# Patient Record
Sex: Female | Born: 1935 | Race: White | Hispanic: No | Smoking: Never smoker
Health system: Southern US, Community
[De-identification: ages and names within clinical notes are randomized; demographics above are authoritative.]

## PROBLEM LIST (undated history)

## (undated) DIAGNOSIS — C50312 Malignant neoplasm of lower-inner quadrant of left female breast: Secondary | ICD-10-CM

## (undated) DIAGNOSIS — M1990 Unspecified osteoarthritis, unspecified site: Secondary | ICD-10-CM

## (undated) DIAGNOSIS — Z171 Estrogen receptor negative status [ER-]: Secondary | ICD-10-CM

## (undated) DIAGNOSIS — C50919 Malignant neoplasm of unspecified site of unspecified female breast: Secondary | ICD-10-CM

## (undated) DIAGNOSIS — H9190 Unspecified hearing loss, unspecified ear: Secondary | ICD-10-CM

## (undated) DIAGNOSIS — I10 Essential (primary) hypertension: Secondary | ICD-10-CM

## (undated) HISTORY — DX: Estrogen receptor negative status (ER-): Z17.1

## (undated) HISTORY — DX: Malignant neoplasm of unspecified site of unspecified female breast: C50.919

## (undated) HISTORY — DX: Malignant neoplasm of lower-inner quadrant of left female breast: C50.312

## (undated) HISTORY — PX: ABDOMINAL HYSTERECTOMY: SHX81

---

## 1996-03-05 HISTORY — PX: BREAST LUMPECTOMY: SHX2

## 2001-04-14 ENCOUNTER — Encounter: Admission: RE | Admit: 2001-04-14 | Discharge: 2001-04-14 | Payer: Self-pay | Admitting: Endocrinology

## 2002-04-27 ENCOUNTER — Encounter: Admission: RE | Admit: 2002-04-27 | Discharge: 2002-04-27 | Payer: Self-pay | Admitting: Endocrinology

## 2003-05-04 ENCOUNTER — Encounter: Admission: RE | Admit: 2003-05-04 | Discharge: 2003-05-04 | Payer: Self-pay | Admitting: Endocrinology

## 2004-05-10 ENCOUNTER — Encounter: Admission: RE | Admit: 2004-05-10 | Discharge: 2004-05-10 | Payer: Self-pay | Admitting: Endocrinology

## 2004-12-13 ENCOUNTER — Ambulatory Visit: Payer: Self-pay

## 2004-12-29 ENCOUNTER — Ambulatory Visit: Payer: Self-pay | Admitting: Unknown Physician Specialty

## 2005-01-01 ENCOUNTER — Ambulatory Visit: Payer: Self-pay | Admitting: Unknown Physician Specialty

## 2005-05-11 ENCOUNTER — Encounter: Admission: RE | Admit: 2005-05-11 | Discharge: 2005-05-11 | Payer: Self-pay | Admitting: Endocrinology

## 2005-05-29 ENCOUNTER — Encounter: Admission: RE | Admit: 2005-05-29 | Discharge: 2005-05-29 | Payer: Self-pay | Admitting: Endocrinology

## 2005-10-15 ENCOUNTER — Encounter: Admission: RE | Admit: 2005-10-15 | Discharge: 2005-10-15 | Payer: Self-pay | Admitting: Endocrinology

## 2006-03-05 HISTORY — PX: JOINT REPLACEMENT: SHX530

## 2006-05-14 ENCOUNTER — Encounter: Admission: RE | Admit: 2006-05-14 | Discharge: 2006-05-14 | Payer: Self-pay | Admitting: Endocrinology

## 2007-05-26 ENCOUNTER — Encounter: Payer: Self-pay | Admitting: Endocrinology

## 2007-05-26 ENCOUNTER — Encounter: Admission: RE | Admit: 2007-05-26 | Discharge: 2007-05-26 | Payer: Self-pay | Admitting: Endocrinology

## 2007-06-04 ENCOUNTER — Encounter: Payer: Self-pay | Admitting: Endocrinology

## 2007-09-15 ENCOUNTER — Ambulatory Visit: Payer: Self-pay | Admitting: Unknown Physician Specialty

## 2007-09-25 ENCOUNTER — Inpatient Hospital Stay: Payer: Self-pay | Admitting: Unknown Physician Specialty

## 2007-09-29 ENCOUNTER — Encounter: Payer: Self-pay | Admitting: Endocrinology

## 2007-10-04 ENCOUNTER — Encounter: Payer: Self-pay | Admitting: Endocrinology

## 2007-10-17 ENCOUNTER — Encounter: Payer: Self-pay | Admitting: Unknown Physician Specialty

## 2007-10-31 ENCOUNTER — Ambulatory Visit: Payer: Self-pay | Admitting: Unknown Physician Specialty

## 2007-11-04 ENCOUNTER — Encounter: Payer: Self-pay | Admitting: Unknown Physician Specialty

## 2007-12-04 ENCOUNTER — Encounter: Payer: Self-pay | Admitting: Unknown Physician Specialty

## 2008-01-04 ENCOUNTER — Encounter: Payer: Self-pay | Admitting: Unknown Physician Specialty

## 2008-05-27 ENCOUNTER — Encounter: Admission: RE | Admit: 2008-05-27 | Discharge: 2008-05-27 | Payer: Self-pay | Admitting: Endocrinology

## 2009-06-08 ENCOUNTER — Encounter: Admission: RE | Admit: 2009-06-08 | Discharge: 2009-06-08 | Payer: Self-pay | Admitting: Endocrinology

## 2010-03-26 ENCOUNTER — Encounter: Payer: Self-pay | Admitting: Endocrinology

## 2010-05-23 ENCOUNTER — Other Ambulatory Visit: Payer: Self-pay | Admitting: Endocrinology

## 2010-05-23 ENCOUNTER — Other Ambulatory Visit: Payer: Self-pay | Admitting: *Deleted

## 2010-05-23 DIAGNOSIS — Z853 Personal history of malignant neoplasm of breast: Secondary | ICD-10-CM

## 2010-05-23 DIAGNOSIS — Z1231 Encounter for screening mammogram for malignant neoplasm of breast: Secondary | ICD-10-CM

## 2010-06-20 ENCOUNTER — Ambulatory Visit
Admission: RE | Admit: 2010-06-20 | Discharge: 2010-06-20 | Disposition: A | Discharge status: Home / Self Care | Payer: Medicare Other | Source: Ambulatory Visit | Attending: Endocrinology | Admitting: Endocrinology

## 2010-06-20 DIAGNOSIS — Z853 Personal history of malignant neoplasm of breast: Secondary | ICD-10-CM

## 2010-06-20 DIAGNOSIS — Z1231 Encounter for screening mammogram for malignant neoplasm of breast: Secondary | ICD-10-CM

## 2010-06-26 ENCOUNTER — Other Ambulatory Visit: Payer: Self-pay | Admitting: Endocrinology

## 2010-06-26 DIAGNOSIS — N63 Unspecified lump in unspecified breast: Secondary | ICD-10-CM

## 2010-07-04 ENCOUNTER — Other Ambulatory Visit: Payer: Self-pay | Admitting: Endocrinology

## 2010-07-04 ENCOUNTER — Ambulatory Visit
Admission: RE | Admit: 2010-07-04 | Discharge: 2010-07-04 | Disposition: A | Discharge status: Home / Self Care | Payer: Medicare Other | Source: Ambulatory Visit | Attending: Endocrinology | Admitting: Endocrinology

## 2010-07-04 DIAGNOSIS — N63 Unspecified lump in unspecified breast: Secondary | ICD-10-CM

## 2011-05-15 ENCOUNTER — Ambulatory Visit: Payer: Self-pay | Admitting: Endocrinology

## 2011-05-28 ENCOUNTER — Other Ambulatory Visit: Payer: Self-pay | Admitting: Endocrinology

## 2011-05-28 DIAGNOSIS — Z1231 Encounter for screening mammogram for malignant neoplasm of breast: Secondary | ICD-10-CM

## 2011-07-19 ENCOUNTER — Ambulatory Visit
Admission: RE | Admit: 2011-07-19 | Discharge: 2011-07-19 | Disposition: A | Discharge status: Home / Self Care | Payer: Medicare Other | Source: Ambulatory Visit | Attending: Endocrinology | Admitting: Endocrinology

## 2011-07-19 DIAGNOSIS — Z1231 Encounter for screening mammogram for malignant neoplasm of breast: Secondary | ICD-10-CM

## 2012-06-10 ENCOUNTER — Other Ambulatory Visit: Payer: Self-pay

## 2012-06-10 DIAGNOSIS — Z853 Personal history of malignant neoplasm of breast: Secondary | ICD-10-CM

## 2012-06-10 DIAGNOSIS — Z1231 Encounter for screening mammogram for malignant neoplasm of breast: Secondary | ICD-10-CM

## 2012-07-21 ENCOUNTER — Ambulatory Visit
Admission: RE | Admit: 2012-07-21 | Discharge: 2012-07-21 | Disposition: A | Discharge status: Home / Self Care | Payer: Medicare Other | Source: Ambulatory Visit

## 2012-07-21 DIAGNOSIS — Z853 Personal history of malignant neoplasm of breast: Secondary | ICD-10-CM

## 2012-07-21 DIAGNOSIS — Z1231 Encounter for screening mammogram for malignant neoplasm of breast: Secondary | ICD-10-CM

## 2013-03-05 HISTORY — PX: MASTECTOMY: SHX3

## 2013-07-16 ENCOUNTER — Other Ambulatory Visit: Payer: Self-pay

## 2013-07-21 ENCOUNTER — Ambulatory Visit: Payer: Self-pay | Admitting: Oncology

## 2013-07-28 ENCOUNTER — Ambulatory Visit: Payer: Self-pay | Admitting: Endocrinology

## 2013-08-03 ENCOUNTER — Ambulatory Visit: Payer: Self-pay | Admitting: Oncology

## 2013-08-14 ENCOUNTER — Ambulatory Visit: Payer: Self-pay | Admitting: Oncology

## 2013-08-19 ENCOUNTER — Ambulatory Visit: Payer: Self-pay | Admitting: Surgery

## 2013-08-28 ENCOUNTER — Ambulatory Visit: Payer: Self-pay | Admitting: Surgery

## 2013-09-01 LAB — PATHOLOGY REPORT

## 2013-09-02 ENCOUNTER — Ambulatory Visit: Payer: Self-pay | Admitting: Oncology

## 2013-09-11 ENCOUNTER — Ambulatory Visit: Payer: Self-pay | Admitting: Oncology

## 2013-10-03 ENCOUNTER — Ambulatory Visit: Payer: Self-pay | Admitting: Oncology

## 2014-06-26 NOTE — Op Note (Signed)
PATIENT NAME:  Hayley [name], Hayley [name] MR#:  [id number] DATE OF BIRTH:  20-Feb-1934  DATE OF PROCEDURE:  08/28/2013  PREOPERATIVE DIAGNOSIS: Carcinoma of the right nipple.   POSTOPERATIVE DIAGNOSIS: Carcinoma of the right nipple.   PROCEDURE: Right mastectomy with sentinel lymph node evaluation.   SURGEON: Dr. Rochel Brome.   ANESTHESIA: General.   INDICATION: This 81 year old female has a past history of carcinoma of the right breast and previously has had 3 excisions dating back some 20 years and has had previous radiation, recently had an abnormal appearance of her nipple and biopsy demonstrated invasive mammary carcinoma. Surgery was recommended for definitive treatment.   DESCRIPTION OF PROCEDURE: The patient was placed on the operating table in the supine position under general anesthesia. The right arm was placed on a lateral arm support. The right breast was prepared with ChloraPrep and draped in a sterile manner.   Initial inspection with a gamma counter revealed radioactivity in the breast, which extended up into the axilla although there were no focal point of radioactivity in the axilla apart from the breast. A curvilinear incision was made from medial to lateral above the old scar, which was in the upper aspect of the right breast and also extended just below the border of the areola. Skin and subcutaneous flaps were raised using silk sutures for traction. Flaps were raised in the direction of the clavicle medially to the sternum, inferiorly to the inframammary fold, and laterally towards the latissimus dorsi muscle. The breast was elevated off the underlying fascia with use of electrocautery. The axillary tail was also removed. This was further probed with a gamma counter and did not identify a separate point of radioactivity apart from the breast tissue itself as there was no island or focal area of radioactivity. There was no grossly palpable mass within the axillary tail. The lateral end of  the skin ellipse was tagged with a silk suture for the pathologist's orientation and there was no remaining radioactivity found in the maxilla and no remaining palpable mass within the axilla after the excision. The wound was inspected and hemostasis was intact. There was one small buttonhole in the skin inferior to the lower skin edge which was repaired with a 4-0 Monocryl suture. This buttonhole was approximately 3 mm in dimension. A 15 [place] Blake drain was inserted through a separate inferior medial stab wound. The catheter was cut to fit so that the tip was placed in the axilla. The catheter was sutured to the skin with 3-0 nylon. Next, the skin edges were approximated with running 4-0 Monocryl subcuticular suture and Dermabond. The patient tolerated the procedure satisfactorily and was then prepared for transfer to the recovery room    ____________________________ J. Rochel Brome, MD jws:lt D: 08/28/2013 14:04:54 ET T: 08/29/2013 03:23:45 ET JOB#: [id number]  cc: Loreli Dollar, MD, <Dictator> Loreli Dollar MD ELECTRONICALLY SIGNED 09/02/2013 19:49

## 2014-08-03 ENCOUNTER — Other Ambulatory Visit: Payer: Self-pay | Admitting: Endocrinology

## 2014-08-03 DIAGNOSIS — Z1231 Encounter for screening mammogram for malignant neoplasm of breast: Secondary | ICD-10-CM

## 2014-08-11 ENCOUNTER — Ambulatory Visit
Admission: RE | Admit: 2014-08-11 | Discharge: 2014-08-11 | Disposition: A | Discharge status: Home / Self Care | Payer: Medicare HMO | Source: Ambulatory Visit | Attending: Endocrinology | Admitting: Endocrinology

## 2014-08-11 ENCOUNTER — Other Ambulatory Visit: Payer: Self-pay | Admitting: Endocrinology

## 2014-08-11 DIAGNOSIS — Z1231 Encounter for screening mammogram for malignant neoplasm of breast: Secondary | ICD-10-CM

## 2014-08-11 HISTORY — DX: Malignant neoplasm of unspecified site of unspecified female breast: C50.919

## 2014-12-16 ENCOUNTER — Ambulatory Visit: Payer: Medicare Other | Admitting: Oncology

## 2014-12-21 ENCOUNTER — Encounter: Payer: Self-pay | Admitting: Oncology

## 2014-12-21 ENCOUNTER — Inpatient Hospital Stay: Payer: Medicare HMO

## 2014-12-21 ENCOUNTER — Inpatient Hospital Stay: Payer: Medicare HMO | Attending: Oncology | Admitting: Oncology

## 2014-12-21 VITALS — BP 167/70 | HR 72 | Temp 95.5°F | Wt 117.7 lb

## 2014-12-21 DIAGNOSIS — R918 Other nonspecific abnormal finding of lung field: Secondary | ICD-10-CM | POA: Insufficient documentation

## 2014-12-21 DIAGNOSIS — Z170 Estrogen receptor positive status [ER+]: Secondary | ICD-10-CM

## 2014-12-21 DIAGNOSIS — Z853 Personal history of malignant neoplasm of breast: Secondary | ICD-10-CM

## 2014-12-21 DIAGNOSIS — I972 Postmastectomy lymphedema syndrome: Secondary | ICD-10-CM | POA: Diagnosis not present

## 2014-12-21 DIAGNOSIS — Z87891 Personal history of nicotine dependence: Secondary | ICD-10-CM | POA: Diagnosis not present

## 2014-12-21 DIAGNOSIS — Z923 Personal history of irradiation: Secondary | ICD-10-CM | POA: Diagnosis not present

## 2014-12-21 DIAGNOSIS — I890 Lymphedema, not elsewhere classified: Secondary | ICD-10-CM

## 2014-12-21 DIAGNOSIS — I10 Essential (primary) hypertension: Secondary | ICD-10-CM | POA: Diagnosis not present

## 2014-12-21 DIAGNOSIS — R590 Localized enlarged lymph nodes: Secondary | ICD-10-CM | POA: Diagnosis not present

## 2014-12-21 DIAGNOSIS — Z9013 Acquired absence of bilateral breasts and nipples: Secondary | ICD-10-CM

## 2014-12-21 DIAGNOSIS — R591 Generalized enlarged lymph nodes: Secondary | ICD-10-CM

## 2014-12-21 LAB — COMPREHENSIVE METABOLIC PANEL
ALK PHOS: 50 U/L (ref 38–126)
ALT: 25 U/L (ref 14–54)
AST: 25 U/L (ref 15–41)
Albumin: 4.4 g/dL (ref 3.5–5.0)
Anion gap: 6 (ref 5–15)
BILIRUBIN TOTAL: 0.9 mg/dL (ref 0.3–1.2)
BUN: 19 mg/dL (ref 6–20)
CALCIUM: 8.6 mg/dL — AB (ref 8.9–10.3)
CO2: 28 mmol/L (ref 22–32)
CREATININE: 0.73 mg/dL (ref 0.44–1.00)
Chloride: 100 mmol/L — ABNORMAL LOW (ref 101–111)
Glucose, Bld: 91 mg/dL (ref 65–99)
Potassium: 4.5 mmol/L (ref 3.5–5.1)
Sodium: 134 mmol/L — ABNORMAL LOW (ref 135–145)
TOTAL PROTEIN: 7.1 g/dL (ref 6.5–8.1)

## 2014-12-21 LAB — CBC WITH DIFFERENTIAL/PLATELET
Basophils Absolute: 0 10*3/uL (ref 0–0.1)
Basophils Relative: 1 %
Eosinophils Absolute: 0.2 10*3/uL (ref 0–0.7)
Eosinophils Relative: 4 %
HEMATOCRIT: 42.3 % (ref 35.0–47.0)
HEMOGLOBIN: 14.3 g/dL (ref 12.0–16.0)
LYMPHS ABS: 0.9 10*3/uL — AB (ref 1.0–3.6)
LYMPHS PCT: 14 %
MCH: 30.5 pg (ref 26.0–34.0)
MCHC: 33.8 g/dL (ref 32.0–36.0)
MCV: 90.2 fL (ref 80.0–100.0)
Monocytes Absolute: 0.8 10*3/uL (ref 0.2–0.9)
Monocytes Relative: 11 %
NEUTROS PCT: 70 %
Neutro Abs: 4.9 10*3/uL (ref 1.4–6.5)
Platelets: 202 10*3/uL (ref 150–440)
RBC: 4.69 MIL/uL (ref 3.80–5.20)
RDW: 13.7 % (ref 11.5–14.5)
WBC: 6.9 10*3/uL (ref 3.6–11.0)

## 2014-12-21 LAB — LACTATE DEHYDROGENASE: LDH: 187 U/L (ref 98–192)

## 2014-12-21 NOTE — Progress Notes (Signed)
Patient states she is here today for right arm edema.  Patient has right mastectomy one year ago.  She noticed the arm swelling after pulling up flowers.

## 2014-12-21 NOTE — Progress Notes (Signed)
[place] @ [facility] Telephone:(336) [phone]  Fax:(336) [place]: 20-Oct-1933  MR#: [id number]  LGX#:[id number]  Patient Care Team: Lenard Simmer, MD as PCP - General (Endocrinology)  CHIEF COMPLAINT:  Chief Complaint  Patient presents with  . OTHER  1.patient had carcinoma of breast (right side) patient was operated at [facility] had radiation therapy.  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 Recent abnormaliity of  the nipple:exact duration not knownbiopsy was positive for invasive carcinoma Estrogen receptor positive.  Progesterone receptor positive.  HER-2 receptor negative status post mastectomy Skin involvement T4 B. N0 M0 tumor (June 2015) Patient refused any radiation chemotherapy or any other anti-hormonal therapy   81 year old lady underwent mastectomy of the right breast.  Patient had in the same breast DCIS lumpectomy and radiation therapy in the past.  Had abbreviated course of tamoxifen. Patient had abnormality of the nipple underwent mastectomy.  Pathology was as described involving skene dermis.  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  At [facility].  In June 2015 patient had abnormal mammogram underwent mastectomy with invasive cancer with involvement of skin and nipple Patient refused any further intervention.  Then was lost for follow-up.  Patient even declined anti-hormonal therapy Recently patient was trying to  PULL flowers and noticed swelling of the right upper extremity. Patient was evaluated by primary care physician with a diagnosis of lymphedema was referred to me for further evaluation and treatment consideration No pains.  According to patient  she has noticed swelling just in last few weeks Does not smoke.  Does not drink.  Has not lost any weight.  REVIEW OF SYSTEMS:   GENERAL:  Feels good.  Active.  No fevers, sweats or weight loss. As mentioned in history of present illness patient had swelling of the right upper extremity PERFORMANCE STATUS (ECOG):  01 HEENT:  No visual changes, runny nose, sore throat, mouth sores or tenderness. Lungs: No shortness of breath or cough.  No hemoptysis. Cardiac:  No chest pain, palpitations, orthopnea, or PND. GI:  No nausea, vomiting, diarrhea, constipation, melena or hematochezia. GU:  No urgency, frequency, dysuria, or hematuria. Musculoskeletal:  No back pain.  No joint pain.  No muscle tenderness. Extremities:  No pain or swelling. Skin:  No rashes or skin changes. Neuro:  No headache, numbness or weakness, balance or coordination issues. Endocrine:  No diabetes, thyroid issues, hot flashes or night sweats. Psych:  No mood changes, depression or anxiety. Pain:  No focal pain. Review of systems:  All other systems reviewed and found to be negative. As per HPI. Otherwise, a complete review of systems is negatve.  PAST MEDICAL HISTORY: Past Medical History  Diagnosis Date  . Breast cancer ([place]) 17+ years ago    Right Breast c radiation -- 2015 Mastectomy    PAST SURGICAL HISTORY: Past Surgical History  Procedure Laterality Date  . Mastectomy Right 2015    After screening in May 2015 - Pt choice  . Abdominal hysterectomy    . Breast lumpectomy Right 1998    c Radiation    Significant History/PMH:   hysterctomy:    arthroscopy-right knee:    chicken pox:    breast cancer:  right total knee replacement: 25-Sep-2007   lumpectomy:   Preventive Screening:  Has patient had any of the following test? Mammography (1)   Last Mammography: May 2014(1)   Smoking History: Smoking History quit 50 years ago(1)  PFSH: Family History: noncontributory  Comments: No   family history of any malignancy  Social History: negative alcohol, negative tobacco  Additional Past Medical and Surgical History: As mentioned above   ADVANCED DIRECTIVES:  Patient does have advance healthcare directive, Patient   does not desire to make any changes HEALTH MAINTENANCE: Social History  Substance Use Topics  . Smoking status: Never Smoker   . Smokeless tobacco: None  . Alcohol Use: None        OBJECTIVE:  Filed Vitals:   12/21/14 0957  BP: 167/70  Pulse: 72  Temp: 95.5 F (35.3 C)     There is no height on file to calculate BMI.    ECOG FS:1 - Symptomatic but completely ambulatory  PHYSICAL EXAM: GENERAL:  Well developed, well nourished, sitting comfortably in the exam room in no acute distress. MENTAL STATUS:  Alert and oriented to person, place and time.  ENT:  Oropharynx clear without lesion.  Tongue normal. Mucous membranes moist.  RESPIRATORY:  Clear to auscultation without rales, wheezes or rhonchi. CARDIOVASCULAR:  Regular rate and rhythm without murmur, rub or gallop. BREAST:  Right chest wall area there is no evidence of recurrent disease. ABDOMEN:  Soft, non-tender, with active bowel sounds, and no hepatosplenomegaly.  No masses. BACK:  No CVA tenderness.  No tenderness on percussion of the back or rib cage. SKIN:  No rashes, ulcers or lesions. EXTREMITIES: Right upper extremity lymphedema LYMPH NODES there are small palpable lymph node in the right supraclavicular area and the right side of the neck  NEUROLOGICAL: Unremarkable. PSYCH:  Appropriate.   LAB RESULTS:  CBC Latest Ref Rng 12/21/2014  WBC 3.6 - 11.0 K/uL 6.9  Hemoglobin 12.0 - 16.0 g/dL 14.3  Hematocrit 35.0 - 47.0 % 42.3  Platelets 150 - 440 K/uL 202    Appointment on 12/21/2014  Component Date Value Ref Range Status  . WBC 12/21/2014 6.9  3.6 - 11.0 K/uL Final  . RBC 12/21/2014 4.69  3.80 - 5.20 MIL/uL Final  . Hemoglobin 12/21/2014 14.3  12.0 - 16.0 g/dL Final  . HCT  12/21/2014 42.3  35.0 - 47.0 % Final  . MCV 12/21/2014 90.2  80.0 - 100.0 fL Final  . MCH 12/21/2014 30.5  26.0 - 34.0 pg Final  . MCHC 12/21/2014 33.8  32.0 - 36.0 g/dL Final  . RDW 12/21/2014 13.7  11.5 - 14.5 % Final  . Platelets 12/21/2014 202  150 - 440 K/uL Final  . Neutrophils Relative % 12/21/2014 70   Final  . Neutro Abs 12/21/2014 4.9  1.4 - 6.5 K/uL Final  . Lymphocytes Relative 12/21/2014 14   Final  . Lymphs Abs 12/21/2014 0.9* 1.0 - 3.6 K/uL Final  . Monocytes Relative 12/21/2014 11   Final  . Monocytes Absolute 12/21/2014 0.8  0.2 - 0.9 K/uL Final  . Eosinophils Relative 12/21/2014 4   Final  . Eosinophils Absolute 12/21/2014 0.2  0 - 0.7 K/uL Final  . Basophils Relative 12/21/2014 1   Final  . Basophils Absolute 12/21/2014 0.0  0 - 0.1 K/uL Final  . Sodium 12/21/2014 134* 135 - 145 mmol/L Final  . Potassium 12/21/2014 4.5  3.5 - 5.1 mmol/L Final  . Chloride 12/21/2014 100* 101 - 111 mmol/L Final  .  CO2 12/21/2014 28  22 - 32 mmol/L Final  . Glucose, Bld 12/21/2014 91  65 - 99 mg/dL Final  . BUN 12/21/2014 19  6 - 20 mg/dL Final  . Creatinine, Ser 12/21/2014 0.73  0.44 - 1.00 mg/dL Final  . Calcium 12/21/2014 8.6* 8.9 - 10.3 mg/dL Final  . Total Protein 12/21/2014 7.1  6.5 - 8.1 g/dL Final  . Albumin 12/21/2014 4.4  3.5 - 5.0 g/dL Final  . AST 12/21/2014 25  15 - 41 U/L Final  . ALT 12/21/2014 25  14 - 54 U/L Final  . Alkaline Phosphatase 12/21/2014 50  38 - 126 U/L Final  . Total Bilirubin 12/21/2014 0.9  0.3 - 1.2 mg/dL Final  . GFR calc non Af Amer 12/21/2014 >60  >60 mL/min Final  . GFR calc Af Amer 12/21/2014 >60  >60 mL/min Final   Comment: (NOTE) The eGFR has been calculated using the CKD EPI equation. This calculation has not been validated in all clinical situations. eGFR's persistently <60 mL/min signify possible Chronic Kidney Disease.   . Anion gap 12/21/2014 6  5 - 15 Final  . LDH 12/21/2014 187  98 - 192 U/L Final        ASSESSMENT: 1.   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  Progesterone receptor positive.  HER-2 receptor negative Patient declined any further radiation chemotherapy or anti-hormonal therapy 2, patient now presents with lymphedema on the right upper extremity with small palpable lymph node in the right supraclavicular area and in the neck   MEDICAL DECISION MAKING:  I reviewed all the records from primary care physician Will proceed with CT scan of the neck and chest If no other abnormality detected and a small palpable right supraclavicular lymph node can be biopsied I suspect recurrence of the breast cancer however other etiology of lymphedema cannot be ruled out like previous mastectomy radiation therapy I told patient that if there is no other abnormalities found on CT scan then lymphedema therapy can be recommended All lab data has been reviewed and they are within acceptable range Patient will be reevaluated after CT scan Total duration of visit was 45 minutes.  50% or more time was spent in counseling patient and family regarding prognosis and options of treatment and available resources  Patient expressed understanding and was in agreement with this plan. She also understands that She can call clinic at any time with any questions, concerns, or complaints.    No matching staging information was found for the patient.  Forest Gleason, MD   12/21/2014 11:55 AM

## 2014-12-27 ENCOUNTER — Ambulatory Visit
Admission: RE | Admit: 2014-12-27 | Discharge: 2014-12-27 | Disposition: A | Discharge status: Home / Self Care | Payer: Medicare HMO | Source: Ambulatory Visit | Attending: Oncology | Admitting: Oncology

## 2014-12-27 DIAGNOSIS — R599 Enlarged lymph nodes, unspecified: Secondary | ICD-10-CM | POA: Insufficient documentation

## 2014-12-27 DIAGNOSIS — I890 Lymphedema, not elsewhere classified: Secondary | ICD-10-CM | POA: Insufficient documentation

## 2014-12-27 DIAGNOSIS — Z853 Personal history of malignant neoplasm of breast: Secondary | ICD-10-CM | POA: Insufficient documentation

## 2014-12-27 DIAGNOSIS — R591 Generalized enlarged lymph nodes: Secondary | ICD-10-CM

## 2014-12-27 HISTORY — DX: Essential (primary) hypertension: I10

## 2014-12-27 MED ORDER — IOHEXOL 300 MG/ML  SOLN
75.0000 mL | Freq: Once | INTRAMUSCULAR | Status: AC | PRN
Start: 2014-12-27 — End: 2014-12-27
  Administered 2014-12-27: 75 mL via INTRAVENOUS

## 2014-12-29 ENCOUNTER — Inpatient Hospital Stay: Payer: Medicare HMO

## 2014-12-29 ENCOUNTER — Inpatient Hospital Stay (HOSPITAL_BASED_OUTPATIENT_CLINIC_OR_DEPARTMENT_OTHER): Payer: Medicare HMO | Admitting: Oncology

## 2014-12-29 VITALS — BP 158/77 | HR 71 | Temp 97.7°F | Wt 117.7 lb

## 2014-12-29 DIAGNOSIS — Z9013 Acquired absence of bilateral breasts and nipples: Secondary | ICD-10-CM | POA: Diagnosis not present

## 2014-12-29 DIAGNOSIS — Z853 Personal history of malignant neoplasm of breast: Secondary | ICD-10-CM

## 2014-12-29 DIAGNOSIS — C50919 Malignant neoplasm of unspecified site of unspecified female breast: Secondary | ICD-10-CM

## 2014-12-29 DIAGNOSIS — I10 Essential (primary) hypertension: Secondary | ICD-10-CM

## 2014-12-29 DIAGNOSIS — Z170 Estrogen receptor positive status [ER+]: Secondary | ICD-10-CM

## 2014-12-29 DIAGNOSIS — Z923 Personal history of irradiation: Secondary | ICD-10-CM

## 2014-12-29 DIAGNOSIS — R918 Other nonspecific abnormal finding of lung field: Secondary | ICD-10-CM

## 2014-12-29 DIAGNOSIS — I972 Postmastectomy lymphedema syndrome: Secondary | ICD-10-CM

## 2014-12-29 DIAGNOSIS — Z87891 Personal history of nicotine dependence: Secondary | ICD-10-CM

## 2014-12-29 DIAGNOSIS — R590 Localized enlarged lymph nodes: Secondary | ICD-10-CM

## 2014-12-29 LAB — PROTIME-INR
INR: 0.99
PROTHROMBIN TIME: 13.3 s (ref 11.4–15.0)

## 2014-12-29 LAB — APTT: APTT: 27 s (ref 24–36)

## 2014-12-30 LAB — CANCER ANTIGEN 27.29: CA 27.29: 46.3 U/mL — AB (ref 0.0–38.6)

## 2015-01-01 ENCOUNTER — Encounter: Payer: Self-pay | Admitting: Oncology

## 2015-01-01 NOTE — Progress Notes (Signed)
[place] @ [facility] Telephone:(336) [phone]  Fax:(336) [place]: Jul 06, 1933  MR#: [id number]  EPP#:[id number]  Patient Care Team: Lenard Simmer, MD as PCP - General (Endocrinology)  CHIEF COMPLAINT:  Chief Complaint  Patient presents with  . OTHER  1.patient had carcinoma of breast (right side) patient was operated at [facility] had radiation therapy.  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 Recent abnormaliity of  the nipple:exact duration not knownbiopsy was positive for invasive carcinoma Estrogen receptor positive.  Progesterone receptor positive.  HER-2 receptor negative status post mastectomy Skin involvement T4 B. N0 M0 tumor (June 2015) Patient refused any radiation chemotherapy or any other anti-hormonal therapy   81 year old lady underwent mastectomy of the right breast.  Patient had in the same breast DCIS lumpectomy and radiation therapy in the past.  Had abbreviated course of tamoxifen. Patient had abnormality of the nipple underwent mastectomy.  Pathology was as described involving skene dermis.  Estrogen receptor progesterone receptor  positive and HER-2/neu receptor negative Patient is here to discuss the results and further planning of treatment  INTERVAL HISTORY:  Patient is here with a history of progressive lymphedema in right upper extremity.  As there was a palpable lymph node in the l supraclavicular area.  A CT scan of the neck and chest was done patient is here to discuss the results and further planning of treatment REVIEW OF SYSTEMS:   GENERAL:  Feels good.  Active.  No fevers, sweats or weight loss. As mentioned in history of present illness patient had swelling of the right upper extremity PERFORMANCE STATUS (ECOG):  01 HEENT:  No visual changes, runny nose, sore throat, mouth sores or tenderness. Lungs: No shortness of breath or cough.  No hemoptysis. Cardiac:  No chest pain, palpitations, orthopnea, or  PND. GI:  No nausea, vomiting, diarrhea, constipation, melena or hematochezia. GU:  No urgency, frequency, dysuria, or hematuria. Musculoskeletal:  No back pain.  No joint pain.  No muscle tenderness. Extremities:  No pain or swelling. Skin:  No rashes or skin changes. Neuro:  No headache, numbness or weakness, balance or coordination issues. Endocrine:  No diabetes, thyroid issues, hot flashes or night sweats. Psych:  No mood changes, depression or anxiety. Pain:  No focal pain. Review of systems:  All other systems reviewed and found to be negative. As per HPI. Otherwise, a complete review of systems is negatve.  PAST MEDICAL HISTORY: Past Medical History  Diagnosis Date  . Breast cancer ([place]) 17+ years ago    Right Breast c radiation -- 2015 Mastectomy  . Hypertension     PAST SURGICAL HISTORY: Past Surgical History  Procedure Laterality Date  . Mastectomy Right 2015    After screening in May 2015 - Pt choice  . Abdominal hysterectomy    . Breast lumpectomy Right 1998    c Radiation    Significant History/PMH:   hysterctomy:    arthroscopy-right knee:    chicken pox:    breast cancer:    right total knee replacement: 25-Sep-2007   lumpectomy:   Preventive Screening:  Has patient had any of the following test? Mammography (1)   Last Mammography: May 2014(1)   Smoking History: Smoking History quit 50 years ago(1)  PFSH: Family History: noncontributory  Comments: No  family history of any malignancy  Social History: negative alcohol, negative tobacco  Additional Past Medical and Surgical History: As mentioned above   ADVANCED DIRECTIVES:  Patient does have advance healthcare directive, Patient   does not desire to make any changes HEALTH MAINTENANCE: Social History  Substance Use Topics  . Smoking status: Never Smoker   . Smokeless tobacco: None  . Alcohol Use: None        OBJECTIVE:  Filed Vitals:   12/29/14 0839  BP: 158/77  Pulse: 71    Temp: 97.7 F (36.5 C)     There is no height on file to calculate BMI.    ECOG FS:1 - Symptomatic but completely ambulatory  PHYSICAL EXAM: GENERAL:  Well developed, well nourished, sitting comfortably in the exam room in no acute distress. MENTAL STATUS:  Alert and oriented to person, place and time.  ENT:  Oropharynx clear without lesion.  Tongue normal. Mucous membranes moist.  RESPIRATORY:  Clear to auscultation without rales, wheezes or rhonchi. CARDIOVASCULAR:  Regular rate and rhythm without murmur, rub or gallop. BREAST:  Right chest wall area there is no evidence of recurrent disease. ABDOMEN:  Soft, non-tender, with active bowel sounds, and no hepatosplenomegaly.  No masses. BACK:  No CVA tenderness.  No tenderness on percussion of the back or rib cage. SKIN:  No rashes, ulcers or lesions. EXTREMITIES: Right upper extremity lymphedema LYMPH NODES there are small palpable lymph node in the right supraclavicular area and the right side of the neck  NEUROLOGICAL: Unremarkable. PSYCH:  Appropriate.   LAB RESULTS:  CBC Latest Ref Rng 12/21/2014  WBC 3.6 - 11.0 K/uL 6.9  Hemoglobin 12.0 - 16.0 g/dL 14.3  Hematocrit 35.0 - 47.0 % 42.3  Platelets 150 - 440 K/uL 202    Appointment on 12/29/2014  Component Date Value Ref Range Status  . CA 27.29 12/29/2014 46.3* 0.0 - 38.6 U/mL Final   Comment: (NOTE) [facility]/ACS methodology Performed At: [facility] [place], [place] [id number] Lindon Romp MD KF:[id number]   . aPTT 12/29/2014 27  24 - 36 seconds Final  . Prothrombin Time 12/29/2014 13.3  11.4 - 15.0 seconds Final  . INR 12/29/2014 0.99   Final    IMPRESSION: 1. Right supraclavicular adenopathy consistent with metastatic disease. 2. Intrathoracic staging reported separately. 1. Neck findings dictated separately. 2. Right axillary/subpectoral space adenopathy worrisome for local recurrence of breast cancer. This could affect the  brachial plexus on the right. 3. Bilateral lower lobe pulmonary nodules worrisome for metastatic disease. 4. Correlation with outside imaging and restaging PET-CT may be helpful.   ASSESSMENT: 1.  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  Progesterone receptor positive.  HER-2 receptor negative Patient declined any further radiation chemotherapy or anti-hormonal therapy 2, patient now presents with lymphedema on the right upper extremity with small palpable lymph node in the right supraclavicular area and in the neck   MEDICAL DECISION MAKING:   All CT scan of the neck and the chest is been reviewed independently and reviewed with the patient. I had prolonged discussion with patient that most likely we are dealing with recurrence of the breast cancer I would like to get a PET scan done followed by a possible needle biopsy of one of the lymph node to confirm the diagnosis.  If we do not see any extensive disease in the liver or lung possibility of anti-hormonal therapy with  letrozole and IBRANCE can be considered if we confirm this to be of breast cancer or recurrent disease  At this point in time patient is agreeable to start that treatment  So PET scan has been ordered.  At patient's request all the records would be copied and sent to her primary care physician Patient expressed understanding and was in agreement with this plan. She also understands that She can call clinic at any time with any questions, concerns, or complaints.    No matching staging information was found for the patient.  Forest Gleason, MD   01/01/2015 12:11 PM

## 2015-01-04 ENCOUNTER — Ambulatory Visit
Admission: RE | Admit: 2015-01-04 | Discharge: 2015-01-04 | Disposition: A | Discharge status: Home / Self Care | Payer: Medicare HMO | Source: Ambulatory Visit | Attending: Oncology | Admitting: Oncology

## 2015-01-04 DIAGNOSIS — Z9011 Acquired absence of right breast and nipple: Secondary | ICD-10-CM | POA: Diagnosis not present

## 2015-01-04 DIAGNOSIS — R918 Other nonspecific abnormal finding of lung field: Secondary | ICD-10-CM | POA: Insufficient documentation

## 2015-01-04 DIAGNOSIS — C50919 Malignant neoplasm of unspecified site of unspecified female breast: Secondary | ICD-10-CM

## 2015-01-04 DIAGNOSIS — C50911 Malignant neoplasm of unspecified site of right female breast: Secondary | ICD-10-CM | POA: Insufficient documentation

## 2015-01-04 DIAGNOSIS — R590 Localized enlarged lymph nodes: Secondary | ICD-10-CM | POA: Diagnosis not present

## 2015-01-04 LAB — GLUCOSE, CAPILLARY: GLUCOSE-CAPILLARY: 76 mg/dL (ref 65–99)

## 2015-01-04 MED ORDER — FLUDEOXYGLUCOSE F - 18 (FDG) INJECTION
12.2500 | Freq: Once | INTRAVENOUS | Status: DC | PRN
  Administered 2015-01-04: 12.25 via INTRAVENOUS
  Filled 2015-01-04: qty 12.25

## 2015-01-06 ENCOUNTER — Inpatient Hospital Stay: Payer: Medicare HMO | Attending: Oncology | Admitting: Oncology

## 2015-01-06 ENCOUNTER — Encounter: Payer: Self-pay | Admitting: Oncology

## 2015-01-06 VITALS — BP 171/74 | HR 76 | Temp 95.8°F | Wt 116.8 lb

## 2015-01-06 DIAGNOSIS — M25511 Pain in right shoulder: Secondary | ICD-10-CM | POA: Diagnosis not present

## 2015-01-06 DIAGNOSIS — Z923 Personal history of irradiation: Secondary | ICD-10-CM | POA: Insufficient documentation

## 2015-01-06 DIAGNOSIS — Z86000 Personal history of in-situ neoplasm of breast: Secondary | ICD-10-CM

## 2015-01-06 DIAGNOSIS — I972 Postmastectomy lymphedema syndrome: Secondary | ICD-10-CM | POA: Insufficient documentation

## 2015-01-06 DIAGNOSIS — Z9011 Acquired absence of right breast and nipple: Secondary | ICD-10-CM | POA: Diagnosis not present

## 2015-01-06 DIAGNOSIS — Z170 Estrogen receptor positive status [ER+]: Secondary | ICD-10-CM | POA: Diagnosis not present

## 2015-01-06 DIAGNOSIS — Z87891 Personal history of nicotine dependence: Secondary | ICD-10-CM | POA: Diagnosis not present

## 2015-01-06 DIAGNOSIS — I10 Essential (primary) hypertension: Secondary | ICD-10-CM

## 2015-01-06 DIAGNOSIS — C773 Secondary and unspecified malignant neoplasm of axilla and upper limb lymph nodes: Secondary | ICD-10-CM | POA: Insufficient documentation

## 2015-01-06 DIAGNOSIS — R590 Localized enlarged lymph nodes: Secondary | ICD-10-CM | POA: Insufficient documentation

## 2015-01-06 DIAGNOSIS — Z9013 Acquired absence of bilateral breasts and nipples: Secondary | ICD-10-CM

## 2015-01-06 DIAGNOSIS — Z853 Personal history of malignant neoplasm of breast: Secondary | ICD-10-CM

## 2015-01-06 DIAGNOSIS — Z79811 Long term (current) use of aromatase inhibitors: Secondary | ICD-10-CM | POA: Insufficient documentation

## 2015-01-06 NOTE — Progress Notes (Signed)
[place] @ [facility] Telephone:(336) [phone]  Fax:(336) [place]: Jul 09, 1933  MR#: [id number]  XLK#:[id number]  Patient Care Team: Lenard Simmer, MD as PCP - General (Endocrinology)  CHIEF COMPLAINT:  Chief Complaint  Patient presents with  . Results  1.patient had carcinoma of breast (right side) patient was operated at [facility] had radiation therapy.  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 Recent abnormaliity of  the nipple:exact duration not knownbiopsy was positive for invasive carcinoma Estrogen receptor positive.  Progesterone receptor positive.  HER-2 receptor negative status post mastectomy Skin involvement T4 B. N0 M0 tumor (June 2015) Patient refused any radiation chemotherapy or any other anti-hormonal therapy   81 year old lady underwent mastectomy of the right breast.  Patient had in the same breast DCIS lumpectomy and radiation therapy in the past.  Had abbreviated course of tamoxifen. Patient had abnormality of the nipple underwent mastectomy.  Pathology was as described involving skene dermis.  Estrogen receptor progesterone receptor  positive and HER-2/neu receptor negative Patient is here to discuss the results and further planning of treatment  INTERVAL HISTORY:  Patient is here with a history of progressive lymphedema in right upper extremity.  As there was a palpable lymph node in the l supraclavicular area.  A CT scan of the neck and chest was done patient is here to discuss the results and further planning of treatment.  Patient is here after PET scan was done.  Patient continues to lymphedema.  Has some pain in the right shoulder particularly during the night  REVIEW OF SYSTEMS:   GENERAL:  Feels good.  Active.  No fevers, sweats or weight loss. As mentioned in history of present illness patient had swelling of the right upper extremity PERFORMANCE STATUS (ECOG):  01 HEENT:  No visual changes, runny nose, sore  throat, mouth sores or tenderness. Lungs: No shortness of breath or cough.  No hemoptysis. Cardiac:  No chest pain, palpitations, orthopnea, or PND. GI:  No nausea, vomiting, diarrhea, constipation, melena or hematochezia. GU:  No urgency, frequency, dysuria, or hematuria. Musculoskeletal:  No back pain.  No joint pain.  No muscle tenderness. Extremities:  No pain or swelling. Skin:  No rashes or skin changes. Neuro:  No headache, numbness or weakness, balance or coordination issues. Endocrine:  No diabetes, thyroid issues, hot flashes or night sweats. Psych:  No mood changes, depression or anxiety. Pain:  No focal pain. Review of systems:  All other systems reviewed and found to be negative. As per HPI. Otherwise, a complete review of systems is negatve.  PAST MEDICAL HISTORY: Past Medical History  Diagnosis Date  . Breast cancer ([place]) 17+ years ago    Right Breast c radiation -- 2015 Mastectomy  . Hypertension     PAST SURGICAL HISTORY: Past Surgical History  Procedure Laterality Date  . Mastectomy Right 2015    After screening in May 2015 - Pt choice  . Abdominal hysterectomy    . Breast lumpectomy Right 1998    c Radiation    Significant History/PMH:   hysterctomy:    arthroscopy-right knee:    chicken pox:    breast cancer:    right total knee replacement: 25-Sep-2007   lumpectomy:   Preventive Screening:  Has patient had any of the following test? Mammography (1)   Last Mammography: May 2014(1)   Smoking History: Smoking History quit 50 years ago(1)  PFSH: Family History: noncontributory  Comments: No  family history of any malignancy  Social History: negative alcohol, negative tobacco  Additional Past Medical and Surgical History: As mentioned above   ADVANCED DIRECTIVES:  Patient does have advance healthcare directive, Patient   does not desire to make any changes HEALTH MAINTENANCE: Social History  Substance Use Topics  . Smoking status:  Never Smoker   . Smokeless tobacco: Not on file  . Alcohol Use: Not on file        OBJECTIVE:  Filed Vitals:   01/06/15 0821  BP: 171/74  Pulse: 76  Temp: 95.8 F (35.4 C)     Body mass index is 19.44 kg/(m^2).    ECOG FS:1 - Symptomatic but completely ambulatory  PHYSICAL EXAM: GENERAL:  Well developed, well nourished, sitting comfortably in the exam room in no acute distress. MENTAL STATUS:  Alert and oriented to person, place and time.  ENT:  Oropharynx clear without lesion.  Tongue normal. Mucous membranes moist.  RESPIRATORY:  Clear to auscultation without rales, wheezes or rhonchi. CARDIOVASCULAR:  Regular rate and rhythm without murmur, rub or gallop. BREAST:  Right chest wall area there is no evidence of recurrent disease. ABDOMEN:  Soft, non-tender, with active bowel sounds, and no hepatosplenomegaly.  No masses. BACK:  No CVA tenderness.  No tenderness on percussion of the back or rib cage. SKIN:  No rashes, ulcers or lesions. EXTREMITIES: Right upper extremity lymphedema LYMPH NODES there are small palpable lymph node in the right supraclavicular area and the right side of the neck  NEUROLOGICAL: Unremarkable. PSYCH:  Appropriate.   LAB RESULTS:  CBC Latest Ref Rng 12/21/2014  WBC 3.6 - 11.0 K/uL 6.9  Hemoglobin 12.0 - 16.0 g/dL 14.3  Hematocrit 35.0 - 47.0 % 42.3  Platelets 150 - 440 K/uL [facility] Outpatient Visit on 01/04/2015  Component Date Value Ref Range Status  . Glucose-Capillary 01/04/2015 76  65 - 99 mg/dL Final    IIMPRESSION: Hypermetabolic right supraclavicular and right axillary lymphadenopathy, consistent with metastatic disease.  6 mm hypermetabolic mediastinal lymph node in the lateral aortic region, also suspicious for metastatic disease.  Bilateral posterior lower lobe pulmonary nodules with hypermetabolic activity, suspicious for pulmonary metastases.  No evidence of abdominal or pelvic metastatic  disease.   ASSESSMENT: 1.  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  Progesterone receptor positive.  HER-2 receptor negative Patient declined any further radiation chemotherapy or anti-hormonal therapy 2, patient now presents with lymphedema on the right upper extremity with small palpable lymph node in the right supraclavicular area and in the neck   MEDICAL DECISION MAKING:   A scan has been reviewed shows increase uptake in the right supraclavicular area and right axillary area Both lymph nodes are palpable.  I discussed possibility of CT-guided or ultrasound-guided biopsy.  Patient had number of questions.  Patient may need lymphedema therapy  PET scan has been reviewed independently and reviewed with the patient Plan needle biopsy under CT guided or ultrasound-guided , Possibility of letrozole and IBRANCE versus Faslodex and IBRANCE been discussed in detail Radiation therapy may have rolled however might take lymphedema worse Patient would be referred to lymphedema therapy. Interventional radiologist has been contacted. Patient would be evaluated after biopsies done t patient's request all the records would be copied  and sent to her primary care physician Patient expressed understanding and was in agreement with this plan. She also understands that She can call clinic at any time with any questions, concerns, or complaints.    No matching staging information was found for the patient.  Forest Gleason, MD   01/06/2015 8:28 AM

## 2015-01-06 NOTE — Progress Notes (Signed)
Patient here for PET results.  Right arm continues to be swollen.  Patient offers no other complaints.

## 2015-01-13 ENCOUNTER — Ambulatory Visit
Admission: RE | Admit: 2015-01-13 | Discharge: 2015-01-13 | Disposition: A | Discharge status: Home / Self Care | Payer: Medicare HMO | Source: Ambulatory Visit | Attending: Oncology | Admitting: Oncology

## 2015-01-14 ENCOUNTER — Other Ambulatory Visit: Payer: Self-pay | Admitting: Radiology

## 2015-01-17 ENCOUNTER — Ambulatory Visit
Admission: RE | Admit: 2015-01-17 | Discharge: 2015-01-17 | Disposition: A | Discharge status: Home / Self Care | Payer: Medicare HMO | Source: Ambulatory Visit | Attending: Oncology | Admitting: Oncology

## 2015-01-17 DIAGNOSIS — C8584 Other specified types of non-Hodgkin lymphoma, lymph nodes of axilla and upper limb: Secondary | ICD-10-CM | POA: Insufficient documentation

## 2015-01-17 DIAGNOSIS — R590 Localized enlarged lymph nodes: Secondary | ICD-10-CM | POA: Diagnosis present

## 2015-01-17 DIAGNOSIS — Z853 Personal history of malignant neoplasm of breast: Secondary | ICD-10-CM

## 2015-01-17 MED ORDER — SODIUM CHLORIDE 0.9 % IV SOLN: Freq: Once | INTRAVENOUS | Status: DC

## 2015-01-17 NOTE — OR Nursing (Signed)
Dr Golden Circle reviewed labs, no new labs needed. NS lock only. Pt requesting no moderate sedation so she can drive herself home. Dr Golden Circle concurs however normal saline lock and  meds will be available in event of discomfort.

## 2015-01-17 NOTE — Discharge Instructions (Signed)
Needle Biopsy, Care After °Refer to this sheet in the next few weeks. These instructions provide you with information about caring for yourself after your procedure. Your health care provider may also give you more specific instructions. Your treatment has been planned according to current medical practices, but problems sometimes occur. Call your health care provider if you have any problems or questions after your procedure. °WHAT TO EXPECT AFTER THE PROCEDURE °After your procedure, it is common to have soreness, bruising, or mild pain at the biopsy site. This should go away in a few days. °HOME CARE INSTRUCTIONS °· Rest as directed by your health care provider. °· Take medicines only as directed by your health care provider. °· There are many different ways to close and cover the biopsy site, including stitches (sutures), skin glue, and adhesive strips. Follow your health care provider's instructions about: °¨ Biopsy site care. °¨ Bandage (dressing) changes and removal. °¨ Biopsy site closure removal. °· Check your biopsy site every day for signs of infection. Watch for: °¨ Redness, swelling, or pain. °¨ Fluid, blood, or pus. °SEEK MEDICAL CARE IF: °· You have a fever. °· You have redness, swelling, or pain at the biopsy site that lasts longer than a few days. °· You have fluid, blood, or pus coming from the biopsy site. °· You feel nauseous. °· You vomit. °SEEK IMMEDIATE MEDICAL CARE IF: °· You have shortness of breath. °· You have trouble breathing. °· You have chest pain.   °· You feel dizzy or you faint. °· You have bleeding that does not stop with pressure or a bandage. °· You cough up blood. °· You have pain in your abdomen. °  °This information is not intended to replace advice given to you by your health care provider. Make sure you discuss any questions you have with your health care provider. °  °Document Released: 07/06/2014 Document Reviewed: 07/06/2014 °Elsevier Interactive Patient Education ©2016  [employer]vier Inc[employer] ° °

## 2015-01-17 NOTE — Procedures (Signed)
Right axillary biopsy   Complications:  None  Blood Loss: none  See dictation in canopy pacs

## 2015-01-18 ENCOUNTER — Ambulatory Visit: Payer: Medicare Other | Admitting: Oncology

## 2015-01-20 ENCOUNTER — Encounter: Payer: Self-pay | Admitting: Oncology

## 2015-01-20 ENCOUNTER — Inpatient Hospital Stay (HOSPITAL_BASED_OUTPATIENT_CLINIC_OR_DEPARTMENT_OTHER): Payer: Medicare HMO | Admitting: Oncology

## 2015-01-20 VITALS — BP 139/73 | HR 82 | Temp 96.9°F | Wt 114.4 lb

## 2015-01-20 DIAGNOSIS — C773 Secondary and unspecified malignant neoplasm of axilla and upper limb lymph nodes: Secondary | ICD-10-CM

## 2015-01-20 DIAGNOSIS — C50919 Malignant neoplasm of unspecified site of unspecified female breast: Secondary | ICD-10-CM

## 2015-01-20 DIAGNOSIS — Z79811 Long term (current) use of aromatase inhibitors: Secondary | ICD-10-CM

## 2015-01-20 DIAGNOSIS — Z170 Estrogen receptor positive status [ER+]: Secondary | ICD-10-CM | POA: Diagnosis not present

## 2015-01-20 DIAGNOSIS — Z86000 Personal history of in-situ neoplasm of breast: Secondary | ICD-10-CM

## 2015-01-20 DIAGNOSIS — Z9011 Acquired absence of right breast and nipple: Secondary | ICD-10-CM

## 2015-01-20 DIAGNOSIS — I10 Essential (primary) hypertension: Secondary | ICD-10-CM

## 2015-01-20 DIAGNOSIS — I972 Postmastectomy lymphedema syndrome: Secondary | ICD-10-CM

## 2015-01-20 DIAGNOSIS — Z923 Personal history of irradiation: Secondary | ICD-10-CM

## 2015-01-20 DIAGNOSIS — Z79891 Long term (current) use of opiate analgesic: Secondary | ICD-10-CM

## 2015-01-20 DIAGNOSIS — C50011 Malignant neoplasm of nipple and areola, right female breast: Secondary | ICD-10-CM

## 2015-01-20 HISTORY — DX: Malignant neoplasm of unspecified site of unspecified female breast: C50.919

## 2015-01-20 MED ORDER — PALBOCICLIB 125 MG PO CAPS
125.0000 mg | ORAL_CAPSULE | Freq: Every day | ORAL | Status: DC
Start: 2015-01-20 — End: 2015-04-14

## 2015-01-20 MED ORDER — LETROZOLE 2.5 MG PO TABS: 2.5000 mg | ORAL_TABLET | Freq: Every day | ORAL | Status: DC

## 2015-01-20 NOTE — Progress Notes (Signed)
[place] @ [facility] Telephone:(336) [phone]  Fax:(336) [place]: December 15, 1933  MR#: [id number]  MPN#:[id number]  Patient Care Team: Lenard Simmer, MD as PCP - General (Endocrinology)  CHIEF COMPLAINT:  No chief complaint on file. 1.patient had carcinoma of breast (right side) patient was operated at [facility] had radiation therapy.  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 Recent abnormaliity of  the nipple:exact duration not knownbiopsy was positive for invasive carcinoma Estrogen receptor positive.  Progesterone receptor positive.  HER-2 receptor negative status post mastectomy Skin involvement T4 B. N0 M0 tumor (June 2015) Patient refused any radiation chemotherapy or any other anti-hormonal therapy.  3.  Biopsy of right axillary lymph node is consistent with mammary carcinoma (November, 2016) Estrogen and progesterone receptor pending 4.  Patient has been started on [facility] and letrozole from November of 2016    INTERVAL HISTORY:  81 year old lady came back after biopsy of the right axillary lymph node which is consistent with mammary carcinoma.   Estrogen and progesterone receptor pending but considering the previous estrogen and progesterone receptors were positive will proceed with anti-hormonal therapy  Patient continues to a problem with lymphedema t  REVIEW OF SYSTEMS:   GENERAL:  Feels good.  Active.  No fevers, sweats or weight loss. As mentioned in history of present illness patient had swelling of the right upper extremity PERFORMANCE STATUS (ECOG):  01 HEENT:  No visual changes, runny nose, sore throat, mouth sores or tenderness. Lungs: No shortness of breath or cough.  No hemoptysis. Cardiac:  No chest pain, palpitations, orthopnea, or PND. GI:  No nausea, vomiting, diarrhea, constipation, melena or hematochezia. GU:  No urgency, frequency, dysuria, or hematuria. Musculoskeletal:  No back pain.  No joint pain.  No  muscle tenderness. Extremities:  No pain or swelling. Skin:  No rashes or skin changes. Neuro:  No headache, numbness or weakness, balance or coordination issues. Endocrine:  No diabetes, thyroid issues, hot flashes or night sweats. Psych:  No mood changes, depression or anxiety. Pain:  No focal pain. Review of systems:  All other systems reviewed and found to be negative. As per HPI. Otherwise, a complete review of systems is negatve.  PAST MEDICAL HISTORY: Past Medical History  Diagnosis Date  . Breast cancer ([place]) 17+ years ago    Right Breast c radiation -- 2015 Mastectomy  . Hypertension   . Cancer of breast ([place]) 01/20/2015    PAST SURGICAL HISTORY: Past Surgical History  Procedure Laterality Date  . Mastectomy Right 2015    After screening in May 2015 - Pt choice  . Abdominal hysterectomy    . Breast lumpectomy Right 1998    c Radiation  . Joint replacement Right 2008    knee    Significant History/PMH:   hysterctomy:    arthroscopy-right knee:    chicken pox:    breast cancer:    right total knee replacement: 25-Sep-2007   lumpectomy:   Preventive Screening:  Has patient had any of the following test? Mammography (1)   Last Mammography: May 2014(1)   Smoking History: Smoking History quit 50 years ago(1)  PFSH: Family History: noncontributory  Comments: No  family history of any malignancy  Social History: negative alcohol, negative tobacco  Additional Past Medical and Surgical History: As mentioned above   ADVANCED DIRECTIVES:  Patient does have advance healthcare directive, Patient   does not desire to make any changes HEALTH MAINTENANCE: Social  History  Substance Use Topics  . Smoking status: Never Smoker   . Smokeless tobacco: Not on file  . Alcohol Use: No        OBJECTIVE:  There were no vitals filed for this visit.   There is no weight on file to calculate BMI.    ECOG FS:1 - Symptomatic but completely ambulatory  PHYSICAL  EXAM: GENERAL:  Well developed, well nourished, sitting comfortably in the exam room in no acute distress. MENTAL STATUS:  Alert and oriented to person, place and time.  ENT:  Oropharynx clear without lesion.  Tongue normal. Mucous membranes moist.  RESPIRATORY:  Clear to auscultation without rales, wheezes or rhonchi. CARDIOVASCULAR:  Regular rate and rhythm without murmur, rub or gallop. BREAST:  Right chest wall area there is no evidence of recurrent disease. ABDOMEN:  Soft, non-tender, with active bowel sounds, and no hepatosplenomegaly.  No masses. BACK:  No CVA tenderness.  No tenderness on percussion of the back or rib cage. SKIN:  No rashes, ulcers or lesions. EXTREMITIES: Right upper extremity lymphedema LYMPH NODES there are small palpable lymph node in the right supraclavicular area and the right side of the neck  NEUROLOGICAL: Unremarkable. PSYCH:  Appropriate.   LAB RESULTS:  CBC Latest Ref Rng 12/21/2014  WBC 3.6 - 11.0 K/uL 6.9  Hemoglobin 12.0 - 16.0 g/dL 14.3  Hematocrit 35.0 - 47.0 % 42.3  Platelets 150 - 440 K/uL [facility] Outpatient Visit on 01/17/2015  Component Date Value Ref Range Status  . SURGICAL PATHOLOGY 01/17/2015    Final                   Value:Surgical Pathology CASE: [id number] PATIENT: Hayley [name] Surgical Pathology Report     SPECIMEN SUBMITTED: A. Axilla lymph node, right  CLINICAL HISTORY: History of right breast cancer.  Suspicion of recurrent breast cancer  PRE-OPERATIVE DIAGNOSIS: None Provided  POST-OPERATIVE DIAGNOSIS: None provided     DIAGNOSIS: A. LYMPH NODE, RIGHT AXILLARY; CORE BIOPSY: - METASTATIC CARCINOMA, MORPHOLOGICALLY CONSISTENT WITH MAMMARY ORIGIN.   GROSS DESCRIPTION: A. Labeled: Right axilla  Tissue fragment(s): 3  Size: 0.8-1.4 cm in length by 0.1 cm in diameter  Description: Pale-tan to red cylindrically shaped tissue fragments  Entirely submitted in 2 cassette(s).     Final  Diagnosis performed by Delorse Lek, MD.  Electronically signed 01/18/2015 12:14:26PM    The electronic signature indicates that the named Attending Pathologist has evaluated the specimen  Technical component performed at [facility], [address] [place], [place] [address] Lab:                          [phone] Dir: Darrick Penna. Evette Doffing, MD  Professional component performed at [facility], [facility], [place], [place], [place] [address] Lab: [phone] Dir: Dellia Nims. Rubinas, MD      IIMPRESSION: Hypermetabolic right supraclavicular and right axillary lymphadenopathy, consistent with metastatic disease.  6 mm hypermetabolic mediastinal lymph node in the lateral aortic region, also suspicious for metastatic disease.  Bilateral posterior lower lobe pulmonary nodules with hypermetabolic activity, suspicious for pulmonary metastases.  No evidence of abdominal or pelvic metastatic disease.   ASSESSMENT: 1.  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  Progesterone receptor positive.  HER-2 receptor negative 2.. Will send the biopsy specimen for estrogen progesterone and HER-2/neu receptor We will initiate treatment for letrozole IBRANCE and patient was instructed to take calcium and vitamin D3.  Lymphedema on the right upper extremity and patient was referred to lymphedema therapy All the side effects of IBRANCE letrozole was discussed   Duration of visit is 25 minutes and 50% of time was spent discussing VARIOUS  options or alleviating care with other physicians social worker  was in agreement with this plan. She also understands that She can call clinic at any time with any questions, concerns, or complaints.    No matching staging  information was found for the patient.  Forest Gleason, MD   01/20/2015 8:10 AM

## 2015-02-03 ENCOUNTER — Ambulatory Visit: Payer: Medicare HMO | Attending: Oncology | Admitting: Occupational Therapy

## 2015-02-03 DIAGNOSIS — I972 Postmastectomy lymphedema syndrome: Secondary | ICD-10-CM | POA: Diagnosis present

## 2015-02-03 DIAGNOSIS — M62838 Other muscle spasm: Secondary | ICD-10-CM | POA: Insufficient documentation

## 2015-02-03 NOTE — Patient Instructions (Signed)
Upper traps on the R stretch , scalenes  Stretch  Cervical rotation R and L  In shower - 2 x day

## 2015-02-03 NOTE — Therapy (Signed)
[facility] [address] [place], [address] Phone: [phone]   Fax:  [phone]  Occupational Therapy Treatment  Patient Details  Name: Hayley [name] MRN: [id number] Date of Birth: 01-31-1934 No Data Recorded  Encounter Date: 02/03/2015      OT End of Session - 02/03/15 1132    Visit Number 1   Number of Visits 12   Date for OT Re-Evaluation 03/03/15   OT Start Time 0945   OT Stop Time 1100   OT Time Calculation (min) 75 min   Activity Tolerance Patient tolerated treatment well   Behavior During Therapy [facility] for tasks assessed/performed      Past Medical History  Diagnosis Date  . Breast cancer ([place]) 17+ years ago    Right Breast c radiation -- 2015 Mastectomy  . Hypertension   . Cancer of breast ([place]) 01/20/2015    Past Surgical History  Procedure Laterality Date  . Mastectomy Right 2015    After screening in May 2015 - Pt choice  . Abdominal hysterectomy    . Breast lumpectomy Right 1998    c Radiation  . Joint replacement Right 2008    knee    There were no vitals filed for this visit.  Visit Diagnosis:  Postmastectomy lymphedema - Plan: Ot plan of care cert/re-cert  Muscle spasm - Plan: Ot plan of care cert/re-cert      Subjective Assessment - 02/03/15 1117    Subjective  I pulled some flowers Oct 16th - and pulled hard and the next day my arm was just swollen - do have pain at my upper back    Patient Stated Goals I do not want my arm to get bigger , and want to stay active - I am R hand dominant    Currently in Pain? Yes   Pain Score 3    Pain Location Arm   Pain Orientation Right   Pain Descriptors / Indicators Stabbing   Pain Onset More than a month ago   Pain Frequency Intermittent             LYMPHEDEMA/ONCOLOGY QUESTIONNAIRE - 02/03/15 1119    Type   Cancer Type R breast   Surgeries   Mastectomy Date 08/03/13   Lumpectomy Date --  more than 13yrs ago    Date Lymphedema/Swelling Started   Date 12/19/14   Treatment   Current Hormone Treatment --  Letrozole Ibrance   What other symptoms do you have   Are you Having Heaviness or Tightness Yes   Lymphedema Stage   Stage STAGE 2 SPONTANEOUSLY IRREVERSIBLE   Lymphedema Assessments   Lymphedema Assessments Upper extremities   Right Upper Extremity Lymphedema   15 cm Proximal to Olecranon Process 28 cm   10 cm Proximal to Olecranon Process 27.5 cm   Olecranon Process 26 cm   15 cm Proximal to Ulnar Styloid Process 25 cm   10 cm Proximal to Ulnar Styloid Process 22.2 cm   Just Proximal to Ulnar Styloid Process 19.3 cm   Across Hand at [employer] 19 cm   At [place] of 2nd Digit 5.8 cm   At [facility] of Thumb 5.5 cm   Left Upper Extremity Lymphedema   15 cm Proximal to Olecranon Process 24.5 cm   10 cm Proximal to Olecranon Process 23 cm   Olecranon Process 22 cm   15 cm Proximal to Ulnar Styloid Process 19.6 cm   10 cm  Proximal to Ulnar Styloid Process 17.5 cm   Just Proximal to Ulnar Styloid Process 14.2 cm   Across Hand at [employer] 18 cm   At [place] of 2nd Digit 5.5 cm   At [facility] of Thumb 5.5 cm                 OT Treatments/Exercises (OP) - 02/03/15 0001    Neck Exercises: Stretches   Other Neck Stretches upper traps, scalens ,   Other Neck Stretches AROM rotation R and L                 OT Education - 02/03/15 1132    Education provided Yes   Education Details HEP , findings of eval and ed on lymphedema   Person(s) Educated Patient   Methods Explanation;Demonstration;Tactile cues;Verbal cues   Comprehension Returned demonstration;Verbalized understanding;Verbal cues required          OT Short Term Goals - 02/03/15 1141    OT SHORT TERM GOAL #1   Title R UE circumference  decrease by 3 cm at least from wrist to upper arm to get measure for compression garments    Baseline R UE increase compare to L by 4 cm to 5.4 wrist to upper arm - hand increased  1 cm    Time 3   Period Weeks   Status New   OT SHORT TERM GOAL #2   Title Pt  L cervical rotation and lateral flexion improve to the same than R side pain less than 3/10    Baseline 3/10 at the best and 7/10 at the worse over upper trap - L cervical rotation and lateral flexion decrease   Time 3   Period Weeks   Status New           OT Long Term Goals - 02/03/15 1145    OT LONG TERM GOAL #1   Title Pt to be ind in use of compression daytime sleeve and glove - as well as if needed night time compression sleeve    Baseline no garments yet   Time 4   Period Weeks   Status New   OT LONG TERM GOAL #2   Title Assess if pt needs compression pump to use 2 x day in stead of night time compression sleeve   Baseline no pump or garments    Time 4   Period Weeks   Status New               Plan - 02/03/15 1133    Clinical Impression Statement Pt present with R UE lymphedema -stage 2 - she is R hand dominant -  pt show increase  circumference compare to L by 1 cm at hand, wrist 5.1 cm , 5.4 at forearm,  4cm at elbow and 4.5 at upper arm  - pt has also some swelling over upper back - but present with strain to upper trap area after pulling flowers and pruning in Oct - that  was also when she noticed the lymphedema in R UE the next day - pt was ed on lymphedema , education material provided - will iniitated treatment on 5th  Dec    Pt will benefit from skilled therapeutic intervention in order to improve on the following deficits (Retired) Pain;Impaired UE functional use;Increased edema;Impaired flexibility;Decreased knowledge of precautions   Rehab Potential Good   OT Frequency 3x / week   OT Duration 4 weeks   OT Treatment/Interventions Self-care/ADL training;Therapeutic exercise;Patient/family  education;Manual lymph drainage;Passive range of motion;Compression bandaging   Plan Initiated CDT to R UE - assess pain in upper trap and HEP    OT Home Exercise Plan see pt instruction     Consulted and Agree with Plan of Care Patient          G-Codes - 2015/03/01 1152    Functional Assessment Tool Used circumference of R UE, pain scale , ROM , clinical judgement   Functional Limitation Self care   Self Care Current Status [id number]) At least 20 percent but less than 40 percent impaired, limited or restricted   Self Care Goal Status [id number]) At least 1 percent but less than 20 percent impaired, limited or restricted      Problem List Patient Active Problem List   Diagnosis Date Noted  . Cancer of breast ([place]) 01/20/2015  . BP (high blood pressure) 12/21/2014  . H/O malignant neoplasm of breast 12/21/2014    Rosalyn Gess OTR/L,CLT 01-Mar-2015, 11:56 AM  [place] [facility] [address] [place], [address] Phone: [phone]   Fax:  [phone]  Name: Hayley [name] MRN: [id number] Date of Birth: 1933-06-02

## 2015-02-04 ENCOUNTER — Telehealth: Payer: Self-pay | Admitting: *Deleted

## 2015-02-04 NOTE — Telephone Encounter (Signed)
States she just started taking Ibrance and wants to know if she can have her teeth cleaned. Her appt is on 12/13 adn her fu with [facility] is on 12/15. I asked her to change her dental appt until after her lab md appt  To be sure that her counts are good. She said she doesn't want to take any chances so she will reschedule her dental appt

## 2015-02-07 ENCOUNTER — Ambulatory Visit: Payer: Medicare HMO | Admitting: Occupational Therapy

## 2015-02-07 DIAGNOSIS — I972 Postmastectomy lymphedema syndrome: Secondary | ICD-10-CM

## 2015-02-07 DIAGNOSIS — M62838 Other muscle spasm: Secondary | ICD-10-CM

## 2015-02-07 NOTE — Therapy (Signed)
[place] [facility] [address] [place], [address] Phone: [phone]   Fax:  [phone]  Occupational Therapy Treatment  Patient Details  Name: Hayley [name] MRN: [id number] Date of Birth: 1933/04/10 No Data Recorded  Encounter Date: 02/07/2015      OT End of Session - 02/07/15 1453    Visit Number 2   Number of Visits 12   Date for OT Re-Evaluation 03/03/15   OT Start Time 1301   OT Stop Time 1355   OT Time Calculation (min) 54 min   Activity Tolerance Patient tolerated treatment well   Behavior During Therapy [facility] for tasks assessed/performed      Past Medical History  Diagnosis Date  . Breast cancer ([place]) 17+ years ago    Right Breast c radiation -- 2015 Mastectomy  . Hypertension   . Cancer of breast ([place]) 01/20/2015    Past Surgical History  Procedure Laterality Date  . Mastectomy Right 2015    After screening in May 2015 - Pt choice  . Abdominal hysterectomy    . Breast lumpectomy Right 1998    c Radiation  . Joint replacement Right 2008    knee    There were no vitals filed for this visit.  Visit Diagnosis:  Postmastectomy lymphedema  Muscle spasm      Subjective Assessment - 02/07/15 1445    Subjective  My neck and shoulder pain better - still there but those stretches helping - I bought some large gloves to put over the bandages to  wash dishes - I don't know how I am going to do it - but I know I needto do it   Patient Stated Goals I do not want my arm to get bigger , and want to stay active - I am R hand dominant    Currently in Pain? Yes   Pain Score 1    Pain Location Neck   Pain Orientation Right             LYMPHEDEMA/ONCOLOGY QUESTIONNAIRE - 02/07/15 1306    Right Upper Extremity Lymphedema   15 cm Proximal to Olecranon Process 28 cm   10 cm Proximal to Olecranon Process 26.3 cm   Olecranon Process 26 cm   15 cm Proximal to Ulnar Styloid Process 25.6 cm   10  cm Proximal to Ulnar Styloid Process 22.4 cm   Just Proximal to Ulnar Styloid Process 16.6 cm   Across Hand at [employer] 18.5 cm   At [place] of 2nd Digit 5.6 cm   At [facility] of Thumb 5.6 cm                 OT Treatments/Exercises (OP) - 02/07/15 0001    Wrist Exercises   Other wrist exercises Reviewed with pt HEP to do as needed  -- fisting , wrist and elbow fflexion and extention ; shoulder horiszontal ABD and ADD   Manual Therapy   Manual Lymphatic Drainage (MLD) MLD done  - cervical ln on the L , axillary  ln L and inguinal on the R 8 reps , AAA from R to L , R AI, then upper arm and rework , then inner upper arm and rework , then forearm palmar and dorsal with rework inbetween - then end with L axillar ln , L axillary ln , R ing ln 8 reps all    Compression Bandaging Bandage R UE - skincare done, isotoner glove xsmall ,  nr 9 stockinette, 10 and 15 cm Artiflex hand to upper arm ; 6 cm short strethc wrist<>hand<>wrist;  hand to elbow 8cm ; then 10 cm mid forearm to upper arm - topper of Tubigrip D on                 OT Education - 02/07/15 1453    Education provided Yes   Education Details MLD, and ROM HEP    Person(s) Educated Patient   Methods Explanation;Demonstration;Tactile cues;Verbal cues;Handout   Comprehension Verbal cues required;Returned demonstration;Verbalized understanding          OT Short Term Goals - 02/03/15 1141    OT SHORT TERM GOAL #1   Title R UE circumference  decrease by 3 cm at least from wrist to upper arm to get measure for compression garments    Baseline R UE increase compare to L by 4 cm to 5.4 wrist to upper arm - hand increased 1 cm    Time 3   Period Weeks   Status New   OT SHORT TERM GOAL #2   Title Pt  L cervical rotation and lateral flexion improve to the same than R side pain less than 3/10    Baseline 3/10 at the best and 7/10 at the worse over upper trap - L cervical rotation and lateral flexion decrease   Time 3    Period Weeks   Status New           OT Long Term Goals - 02/03/15 1145    OT LONG TERM GOAL #1   Title Pt to be ind in use of compression daytime sleeve and glove - as well as if needed night time compression sleeve    Baseline no garments yet   Time 4   Period Weeks   Status New   OT LONG TERM GOAL #2   Title Assess if pt needs compression pump to use 2 x day in stead of night time compression sleeve   Baseline no pump or garments    Time 4   Period Weeks   Status New               Plan - 02/07/15 1454    Clinical Impression Statement  Pt present with R UE lymphedema - stage 2 - forearm measurements increase since last week - pt was bandage this date anded on HEP for ROM , precautions - pt verbalize understanding    Pt will benefit from skilled therapeutic intervention in order to improve on the following deficits (Retired) Pain;Impaired UE functional use;Increased edema;Impaired flexibility;Decreased knowledge of precautions   Rehab Potential Good   OT Frequency 3x / week   OT Duration 4 weeks   OT Treatment/Interventions Self-care/ADL training;Therapeutic exercise;Patient/family education;Manual lymph drainage;Passive range of motion;Compression bandaging   Plan assess circumference - pain at upper trap - rebandage   OT Home Exercise Plan see pt instruction    Consulted and Agree with Plan of Care Patient        Problem List Patient Active Problem List   Diagnosis Date Noted  . Cancer of breast ([place]) 01/20/2015  . BP (high blood pressure) 12/21/2014  . H/O malignant neoplasm of breast 12/21/2014    Rosalyn Gess OTR/L,CLT 02/07/2015, 2:57 PM  [place] [facility] [address] [place], [address] Phone: [phone]   Fax:  [phone]  Name: SHAUNETTE MARKES MRN: [id number] Date of Birth: 08-Nov-1933

## 2015-02-07 NOTE — Patient Instructions (Signed)
Keep bandages on - ed on precautions   Ed on HEP for ROM hand fisting, wrist nad elbow flexion adn extention  Shoulder ABD and ADD horizontal   MLD for trunk

## 2015-02-09 ENCOUNTER — Ambulatory Visit: Payer: Medicare HMO | Admitting: Occupational Therapy

## 2015-02-09 DIAGNOSIS — M62838 Other muscle spasm: Secondary | ICD-10-CM

## 2015-02-09 DIAGNOSIS — I972 Postmastectomy lymphedema syndrome: Secondary | ICD-10-CM

## 2015-02-09 NOTE — Therapy (Signed)
[place] [facility] [address], [place], [address] Phone: [phone]   Fax:  [phone]  Occupational Therapy Treatment  Patient Details  Name: Hayley [name] MRN: [id number] Date of Birth: 1934-03-02 No Data Recorded  Encounter Date: 02/09/2015      OT End of Session - 02/09/15 1324    Visit Number 3   Number of Visits 12   Date for OT Re-Evaluation 03/03/15   OT Start Time 1220   OT Stop Time 1317   OT Time Calculation (min) 57 min   Activity Tolerance Patient tolerated treatment well   Behavior During Therapy [facility] for tasks assessed/performed      Past Medical History  Diagnosis Date  . Breast cancer ([place]) 17+ years ago    Right Breast c radiation -- 2015 Mastectomy  . Hypertension   . Cancer of breast ([place]) 01/20/2015    Past Surgical History  Procedure Laterality Date  . Mastectomy Right 2015    After screening in May 2015 - Pt choice  . Abdominal hysterectomy    . Breast lumpectomy Right 1998    c Radiation  . Joint replacement Right 2008    knee    There were no vitals filed for this visit.  Visit Diagnosis:  Postmastectomy lymphedema  Muscle spasm      Subjective Assessment - 02/09/15 1222    Subjective  My shoulder and neck so much better - doing those stretches - little bit of itching but not bad - used my arm and I figured out how to take shower without getting my arm wet - and using gloves to wash    Patient Stated Goals I do not want my arm to get bigger , and want to stay active - I am R hand dominant    Currently in Pain? No/denies             LYMPHEDEMA/ONCOLOGY QUESTIONNAIRE - 02/09/15 1232    Right Upper Extremity Lymphedema   15 cm Proximal to Olecranon Process 27 cm   10 cm Proximal to Olecranon Process 25.5 cm   Olecranon Process 24.5 cm   15 cm Proximal to Ulnar Styloid Process 24 cm   10 cm Proximal to Ulnar Styloid Process 20.3 cm   Just Proximal to  Ulnar Styloid Process 15 cm   Across Hand at [employer] 18.4 cm   At [place] of 2nd Digit 5.5 cm   At [facility] of Thumb 5.5 cm                 OT Treatments/Exercises (OP) - 02/09/15 0001    Manual Therapy   Manual Lymphatic Drainage (MLD) MLD done  - cervical ln on the L , axillary  ln L and inguinal on the R 8 reps , AAA from R to L , R AI, then upper arm and rework , then inner upper arm and rework , then forearm palmar and dorsal with rework inbetween - then end with L axillar ln , L axillary ln , R ing ln 8 reps all    Compression Bandaging Bandage R UE - pt arrive with bandages on , removed  and measurements taken - skincare done,new  isotoner glove xsmall , nr 9 stockinette, 10 and 15 cm Artiflex hand to upper arm  and Rosidal foam arond elbow ; 6 cm short strethc wrist<>hand<>wrist;  hand to elbow 8cm ; then 10 cm mid forearm to upper arm -  topper of Tubigrip D on                 OT Education - 02/09/15 1324    Education provided Yes   Education Details MLD and HEP    Person(s) Educated Patient   Methods Explanation;Demonstration;Tactile cues;Verbal cues;Handout   Comprehension Verbal cues required;Returned demonstration;Verbalized understanding          OT Short Term Goals - 02/03/15 1141    OT SHORT TERM GOAL #1   Title R UE circumference  decrease by 3 cm at least from wrist to upper arm to get measure for compression garments    Baseline R UE increase compare to L by 4 cm to 5.4 wrist to upper arm - hand increased 1 cm    Time 3   Period Weeks   Status New   OT SHORT TERM GOAL #2   Title Pt  L cervical rotation and lateral flexion improve to the same than R side pain less than 3/10    Baseline 3/10 at the best and 7/10 at the worse over upper trap - L cervical rotation and lateral flexion decrease   Time 3   Period Weeks   Status New           OT Long Term Goals - 02/03/15 1145    OT LONG TERM GOAL #1   Title Pt to be ind in use of compression  daytime sleeve and glove - as well as if needed night time compression sleeve    Baseline no garments yet   Time 4   Period Weeks   Status New   OT LONG TERM GOAL #2   Title Assess if pt needs compression pump to use 2 x day in stead of night time compression sleeve   Baseline no pump or garments    Time 4   Period Weeks   Status New               Plan - 02/09/15 1325    Clinical Impression Statement Pt decrease in circumference from wrist to upper arm more than 1 cm at all areas - pt do have some redness at elbow and upper arm - xtra lotion and Rosidal foam provided - pt to bring next time anti itch lotion to apply if needed -  pt needed min A with MLD  for first 4 steps she do at home - cont with CDT and will fax info to rep about pump  for pt    Pt will benefit from skilled therapeutic intervention in order to improve on the following deficits (Retired) Pain;Impaired UE functional use;Increased edema;Impaired flexibility;Decreased knowledge of precautions   Rehab Potential Good   OT Frequency 3x / week   OT Duration 4 weeks   OT Treatment/Interventions Self-care/ADL training;Therapeutic exercise;Patient/family education;Manual lymph drainage;Passive range of motion;Compression bandaging   Plan assess circumference, pain at upper trap - itching ?   OT Home Exercise Plan see pt instruction    Consulted and Agree with Plan of Care Patient        Problem List Patient Active Problem List   Diagnosis Date Noted  . Cancer of breast ([place]) 01/20/2015  . BP (high blood pressure) 12/21/2014  . H/O malignant neoplasm of breast 12/21/2014    Rosalyn Gess OTR/L,CLT 02/09/2015, 1:28 PM  [place] [facility] [address], [place], [address] Phone: [phone]   Fax:  [phone]  Name: Hayley [name]  [place] MRN: [id number] Date of Birth: 05-07-1933

## 2015-02-09 NOTE — Patient Instructions (Signed)
MLD done only for stimulate cervical ln on L , axillary ln on the L , and ing ln on R 8 reps  Pump across chest from R to L AAA Pump AI on R  All 8 reps - 1-2 x day   AROM for R UE same as last time

## 2015-02-11 ENCOUNTER — Encounter: Payer: Self-pay | Admitting: Occupational Therapy

## 2015-02-11 ENCOUNTER — Ambulatory Visit: Payer: Medicare HMO | Admitting: Occupational Therapy

## 2015-02-11 DIAGNOSIS — I972 Postmastectomy lymphedema syndrome: Secondary | ICD-10-CM | POA: Diagnosis not present

## 2015-02-11 DIAGNOSIS — M62838 Other muscle spasm: Secondary | ICD-10-CM

## 2015-02-11 NOTE — Therapy (Signed)
[place] MAIN [facility] SERVICES [address] [place], [place], [address] Phone: [phone]   Fax:  [phone]  Occupational Therapy Treatment  Patient Details  Name: Hayley [name] MRN: [id number] Date of Birth: 1933-09-25 No Data Recorded  Encounter Date: 02/11/2015      OT End of Session - 02/11/15 2037    Visit Number 4   Number of Visits 12   Date for OT Re-Evaluation 03/03/15   OT Start Time 0831   OT Stop Time 0945   OT Time Calculation (min) 74 min   Activity Tolerance Patient tolerated treatment well   Behavior During Therapy [facility] for tasks assessed/performed      Past Medical History  Diagnosis Date  . Breast cancer ([place]) 17+ years ago    Right Breast c radiation -- 2015 Mastectomy  . Hypertension   . Cancer of breast ([place]) 01/20/2015    Past Surgical History  Procedure Laterality Date  . Mastectomy Right 2015    After screening in May 2015 - Pt choice  . Abdominal hysterectomy    . Breast lumpectomy Right 1998    c Radiation  . Joint replacement Right 2008    knee    There were no vitals filed for this visit.  Visit Diagnosis:  Postmastectomy lymphedema  Muscle spasm      Subjective Assessment - 02/11/15 2036    Patient Stated Goals I do not want my arm to get bigger , and want to stay active - I am R hand dominant    Currently in Pain? Yes             LYMPHEDEMA/ONCOLOGY QUESTIONNAIRE - 02/11/15 0841    Right Upper Extremity Lymphedema   10 cm Proximal to Olecranon Process (p) 25.5 cm   Olecranon Process (p) 25 cm   15 cm Proximal to Ulnar Styloid Process (p) 24 cm   10 cm Proximal to Ulnar Styloid Process (p) 22 cm   Just Proximal to Ulnar Styloid Process (p) 15.3 cm   Across Hand at [employer] (p) 18.2 cm   At [place] of 2nd Digit (p) 5.5 cm   At Base of Thumb (p) 6 cm                 OT Treatments/Exercises (OP) - 02/11/15 2039    Manual Therapy   Manual Lymphatic Drainage  (MLD) MLD done - cervical ln on the L , axillary ln L and inguinal on the R 8 reps , AAA from R to L , R AI, then upper arm and rework , then inner upper arm and rework , then forearm palmar and dorsal with rework inbetween - then end with L axillar ln , L axillary ln , R ing ln 8 reps all    Compression Bandaging Bandage R UE - pt arrive with bandages on , removed and measurements taken - skincare done,new isotoner glove xsmall , nr 9 stockinette, 10 and 15 cm Artiflex hand to upper arm and Rosidal foam arond elbow ; 6 cm short strethc wrist<>hand<>wrist; hand to elbow 8cm ; then 10 cm mid forearm to upper arm - topper of Tubigrip D on Switched stockinette type this date due to redness, patient applied cortizone cream to upper arm.                  OT Education - 02/11/15 2037    Education provided Yes   Education Details HEP with compression bandaging  intact, MLD, lymphedema, lymphatic system and related structures.    Person(s) Educated Patient   Methods Explanation;Demonstration;Verbal cues   Comprehension Verbal cues required;Returned demonstration;Verbalized understanding          OT Short Term Goals - 02/03/15 1141    OT SHORT TERM GOAL #1   Title R UE circumference  decrease by 3 cm at least from wrist to upper arm to get measure for compression garments    Baseline R UE increase compare to L by 4 cm to 5.4 wrist to upper arm - hand increased 1 cm    Time 3   Period Weeks   Status New   OT SHORT TERM GOAL #2   Title Pt  L cervical rotation and lateral flexion improve to the same than R side pain less than 3/10    Baseline 3/10 at the best and 7/10 at the worse over upper trap - L cervical rotation and lateral flexion decrease   Time 3   Period Weeks   Status New           OT Long Term Goals - 02/03/15 1145    OT LONG TERM GOAL #1   Title Pt to be ind in use of compression daytime sleeve and glove - as well as if needed night time compression sleeve    Baseline no  garments yet   Time 4   Period Weeks   Status New   OT LONG TERM GOAL #2   Title Assess if pt needs compression pump to use 2 x day in stead of night time compression sleeve   Baseline no pump or garments    Time 4   Period Weeks   Status New               Plan - 02/11/15 2038    Clinical Impression Statement Patient will some mild redness at the elbow joint, bony prominence as well as mild redness and itching of upper arm.  Switched brands of stockinette this date to see if this may help decrease irritation.  Isotoner glove seam irritating thumb web space, glove turned inside out to avoid compression of the seam on the skin in this area.  Patients circumferential measurements with little change this date.  Able to demo understanding of MLD techniques to apply at home.     Pt will benefit from skilled therapeutic intervention in order to improve on the following deficits (Retired) Pain;Impaired UE functional use;Increased edema;Impaired flexibility;Decreased knowledge of precautions   Rehab Potential Good   OT Frequency 3x / week   OT Duration 4 weeks   OT Treatment/Interventions Self-care/ADL training;Therapeutic exercise;Patient/family education;Manual lymph drainage;Passive range of motion;Compression bandaging   Consulted and Agree with Plan of Care Patient        Problem List Patient Active Problem List   Diagnosis Date Noted  . Cancer of breast ([place]) 01/20/2015  . BP (high blood pressure) 12/21/2014  . H/O malignant neoplasm of breast 12/21/2014   Achilles Dunk, OTR/L, CLT  Lovett,Amy 02/11/2015, 8:44 PM  [place] MAIN [facility] SERVICES [address] [place], [place], [address] Phone: [phone]   Fax:  [phone]  Name: Hayley [name] MRN: [id number] Date of Birth: [birth year]

## 2015-02-14 ENCOUNTER — Ambulatory Visit: Payer: Medicare HMO | Admitting: Occupational Therapy

## 2015-02-14 DIAGNOSIS — I972 Postmastectomy lymphedema syndrome: Secondary | ICD-10-CM

## 2015-02-14 DIAGNOSIS — M62838 Other muscle spasm: Secondary | ICD-10-CM

## 2015-02-15 ENCOUNTER — Encounter: Payer: Self-pay | Admitting: Occupational Therapy

## 2015-02-15 NOTE — Therapy (Signed)
[place] MAIN [facility] SERVICES [address] [place], [place], [address] Phone: [phone]   Fax:  [phone]  Occupational Therapy Treatment  Patient Details  Name: Hayley [name] MRN: [id number] Date of Birth: March 13, 1933 No Data Recorded  Encounter Date: 02/14/2015      OT End of Session - 02/14/15 2007    Visit Number 5   Number of Visits 12   Date for OT Re-Evaluation 03/03/15   OT Start Time 1430   OT Stop Time 1534   OT Time Calculation (min) 64 min   Activity Tolerance Patient tolerated treatment well   Behavior During Therapy [facility] for tasks assessed/performed      Past Medical History  Diagnosis Date  . Breast cancer ([place]) 17+ years ago    Right Breast c radiation -- 2015 Mastectomy  . Hypertension   . Cancer of breast ([place]) 01/20/2015    Past Surgical History  Procedure Laterality Date  . Mastectomy Right 2015    After screening in May 2015 - Pt choice  . Abdominal hysterectomy    . Breast lumpectomy Right 1998    c Radiation  . Joint replacement Right 2008    knee    There were no vitals filed for this visit.  Visit Diagnosis:  Postmastectomy lymphedema  Muscle spasm      Subjective Assessment - 02/14/15 2005    Subjective  Patient reports her arm feels better this date, not itching like she was, thumb web space is still sore but feels better compared to last session.  Some aching on the lateral side of the right hand on pinkie side.    Patient Stated Goals I do not want my arm to get bigger , and want to stay active - I am R hand dominant    Currently in Pain? Yes   Pain Score 1    Pain Location Arm   Pain Orientation Right   Pain Descriptors / Indicators Aching   Pain Type Acute pain   Pain Onset In the past 7 days   Pain Frequency Intermittent   Multiple Pain Sites No             LYMPHEDEMA/ONCOLOGY QUESTIONNAIRE - 02/15/15 2018    Right Upper Extremity Lymphedema   15 cm Proximal to  Olecranon Process 26.3 cm   10 cm Proximal to Olecranon Process 24.5 cm   Olecranon Process 23.4 cm   15 cm Proximal to Ulnar Styloid Process 23.4 cm   10 cm Proximal to Ulnar Styloid Process 19.2 cm   Just Proximal to Ulnar Styloid Process 15.4 cm   Across Hand at [employer] 17.2 cm   At [place] of 2nd Digit 5.6 cm   At [facility] of Thumb 5.6 cm                 OT Treatments/Exercises (OP) - 02/14/15 2014    Manual Therapy   Manual Lymphatic Drainage (MLD) MLD performed - cervical ln on the L , axillary ln L and inguinal on the R 8 reps , AAA from R to L , R AI, then upper arm and rework , then inner upper arm and rework , then forearm palmar and dorsal with rework inbetween - then end with L axillar ln , L axillary ln , R ing ln 8 reps all    Compression Bandaging Bandage R UE - pt arrived with bandages intact , therapist removed bandages and associated padding and circumferential  measurements taken and recorded on flow sheet.  - skincare performed with no redness noted this date, isotoner glove xsmall , nr 9 stockinette, 10 and 15 cm Artiflex hand to upper arm ; 6 cm short stretch bandage from wrist<>hand<>wrist; hand to elbow 8cm ; then 10 cm mid forearm to upper arm - topper of Tubigrip D on Switched stockinette type this date due to redness, patient applied cortizone cream to upper arm.  Instructed patient to keep tubigrip pulled down over tape and not touching the skin which could have potentially caused irritation last time.                 OT Education - 02/14/15 2007    Education provided Yes   Education Details HEP, lymphedema, MLD   Person(s) Educated Patient   Methods Explanation;Demonstration;Verbal cues   Comprehension Verbal cues required;Returned demonstration;Verbalized understanding          OT Short Term Goals - 02/03/15 1141    OT SHORT TERM GOAL #1   Title R UE circumference  decrease by 3 cm at least from wrist to upper arm to get measure for  compression garments    Baseline R UE increase compare to L by 4 cm to 5.4 wrist to upper arm - hand increased 1 cm    Time 3   Period Weeks   Status New   OT SHORT TERM GOAL #2   Title Pt  L cervical rotation and lateral flexion improve to the same than R side pain less than 3/10    Baseline 3/10 at the best and 7/10 at the worse over upper trap - L cervical rotation and lateral flexion decrease   Time 3   Period Weeks   Status New           OT Long Term Goals - 02/03/15 1145    OT LONG TERM GOAL #1   Title Pt to be ind in use of compression daytime sleeve and glove - as well as if needed night time compression sleeve    Baseline no garments yet   Time 4   Period Weeks   Status New   OT LONG TERM GOAL #2   Title Assess if pt needs compression pump to use 2 x day in stead of night time compression sleeve   Baseline no pump or garments    Time 4   Period Weeks   Status New               Plan - 02/14/15 2008    Clinical Impression Statement Patient with decreased pain at the elbow this date, no redness noted on upper arm like last session, no increased itching in this area.  Mild aching at the thumb web space from previous pressure area and lateral side of the hand.  Patient appears to have responded well to changes from last session such as changing stockinette type, turning compression glove with seams outwards and application of cortizone cream supplied by patient.  Her circumferential measurements decreased from last session.  New bandages applied this date, patient to wash the second set.  Continued with changes and made a slight change in bandaging pattern around the elbow area to improve ROM and decrease pain.  Will monitor this change to see if this is beneficial.     Pt will benefit from skilled therapeutic intervention in order to improve on the following deficits (Retired) Pain;Impaired UE functional use;Increased edema;Impaired flexibility;Decreased knowledge of  precautions  Rehab Potential Good   OT Frequency 3x / week   OT Duration 4 weeks   OT Treatment/Interventions Self-care/ADL training;Therapeutic exercise;Patient/family education;Manual lymph drainage;Passive range of motion;Compression bandaging   Consulted and Agree with Plan of Care Patient        Problem List Patient Active Problem List   Diagnosis Date Noted  . Cancer of breast ([place]) 01/20/2015  . BP (high blood pressure) 12/21/2014  . H/O malignant neoplasm of breast 12/21/2014   Achilles Dunk, OTR/L, CLT Lovett,Amy 02/15/2015, 8:19 PM  [place] MAIN [facility] SERVICES [address] [place], [place], [address] Phone: [phone]   Fax:  [phone]  Name: Hayley [name] MRN: [id number] Date of Birth: 1933-11-20

## 2015-02-16 ENCOUNTER — Encounter: Payer: Self-pay | Admitting: Occupational Therapy

## 2015-02-16 ENCOUNTER — Ambulatory Visit: Payer: Medicare HMO | Admitting: Occupational Therapy

## 2015-02-16 DIAGNOSIS — M62838 Other muscle spasm: Secondary | ICD-10-CM

## 2015-02-16 DIAGNOSIS — I972 Postmastectomy lymphedema syndrome: Secondary | ICD-10-CM

## 2015-02-17 ENCOUNTER — Inpatient Hospital Stay: Payer: Medicare HMO | Attending: Oncology

## 2015-02-17 ENCOUNTER — Inpatient Hospital Stay (HOSPITAL_BASED_OUTPATIENT_CLINIC_OR_DEPARTMENT_OTHER): Payer: Medicare HMO | Admitting: Oncology

## 2015-02-17 ENCOUNTER — Encounter: Payer: Self-pay | Admitting: Oncology

## 2015-02-17 VITALS — BP 166/68 | HR 79 | Temp 97.2°F | Resp 18 | Wt 115.1 lb

## 2015-02-17 DIAGNOSIS — Z923 Personal history of irradiation: Secondary | ICD-10-CM

## 2015-02-17 DIAGNOSIS — Z87891 Personal history of nicotine dependence: Secondary | ICD-10-CM

## 2015-02-17 DIAGNOSIS — Z170 Estrogen receptor positive status [ER+]: Secondary | ICD-10-CM | POA: Insufficient documentation

## 2015-02-17 DIAGNOSIS — I972 Postmastectomy lymphedema syndrome: Secondary | ICD-10-CM | POA: Insufficient documentation

## 2015-02-17 DIAGNOSIS — Z79811 Long term (current) use of aromatase inhibitors: Secondary | ICD-10-CM | POA: Diagnosis not present

## 2015-02-17 DIAGNOSIS — I10 Essential (primary) hypertension: Secondary | ICD-10-CM | POA: Insufficient documentation

## 2015-02-17 DIAGNOSIS — D759 Disease of blood and blood-forming organs, unspecified: Secondary | ICD-10-CM

## 2015-02-17 DIAGNOSIS — T386X5S Adverse effect of antigonadotrophins, antiestrogens, antiandrogens, not elsewhere classified, sequela: Secondary | ICD-10-CM

## 2015-02-17 DIAGNOSIS — Z86000 Personal history of in-situ neoplasm of breast: Secondary | ICD-10-CM | POA: Diagnosis not present

## 2015-02-17 DIAGNOSIS — C50011 Malignant neoplasm of nipple and areola, right female breast: Secondary | ICD-10-CM

## 2015-02-17 DIAGNOSIS — C773 Secondary and unspecified malignant neoplasm of axilla and upper limb lymph nodes: Secondary | ICD-10-CM | POA: Insufficient documentation

## 2015-02-17 LAB — CBC WITH DIFFERENTIAL/PLATELET
BASOS ABS: 0 10*3/uL (ref 0–0.1)
Basophils Relative: 1 %
EOS ABS: 0 10*3/uL (ref 0–0.7)
Eosinophils Relative: 2 %
HCT: 40 % (ref 35.0–47.0)
HEMOGLOBIN: 13.7 g/dL (ref 12.0–16.0)
LYMPHS ABS: 0.6 10*3/uL — AB (ref 1.0–3.6)
LYMPHS PCT: 35 %
MCH: 31.2 pg (ref 26.0–34.0)
MCHC: 34.2 g/dL (ref 32.0–36.0)
MCV: 91.1 fL (ref 80.0–100.0)
MONOS PCT: 7 %
Monocytes Absolute: 0.1 10*3/uL — ABNORMAL LOW (ref 0.2–0.9)
NEUTROS ABS: 1 10*3/uL — AB (ref 1.4–6.5)
Neutrophils Relative %: 55 %
Platelets: 105 10*3/uL — ABNORMAL LOW (ref 150–440)
RBC: 4.39 MIL/uL (ref 3.80–5.20)
RDW: 13.5 % (ref 11.5–14.5)
WBC: 1.8 10*3/uL — ABNORMAL LOW (ref 3.6–11.0)

## 2015-02-17 LAB — COMPREHENSIVE METABOLIC PANEL
ALK PHOS: 39 U/L (ref 38–126)
ALT: 25 U/L (ref 14–54)
AST: 24 U/L (ref 15–41)
Albumin: 4.4 g/dL (ref 3.5–5.0)
Anion gap: 9 (ref 5–15)
BUN: 18 mg/dL (ref 6–20)
CALCIUM: 9.3 mg/dL (ref 8.9–10.3)
CO2: 26 mmol/L (ref 22–32)
CREATININE: 0.83 mg/dL (ref 0.44–1.00)
Chloride: 98 mmol/L — ABNORMAL LOW (ref 101–111)
GFR calc non Af Amer: >60 mL/min (ref >60)
GLUCOSE: 80 mg/dL (ref 65–99)
Potassium: 4.2 mmol/L (ref 3.5–5.1)
SODIUM: 133 mmol/L — AB (ref 135–145)
Total Bilirubin: 0.8 mg/dL (ref 0.3–1.2)
Total Protein: 7 g/dL (ref 6.5–8.1)

## 2015-02-17 MED ORDER — LETROZOLE 2.5 MG PO TABS: 2.5000 mg | ORAL_TABLET | Freq: Every day | ORAL | Status: DC

## 2015-02-17 NOTE — Therapy (Signed)
[place] MAIN [facility] SERVICES [address] [place], [place], [address] Phone: [phone]   Fax:  [phone]  Occupational Therapy Treatment  Patient Details  Name: Hayley [name] MRN: [id number] Date of Birth: 1933-12-13 No Data Recorded  Encounter Date: 02/16/2015      OT End of Session - 02/16/15 1600    Visit Number 6   Number of Visits 12   Date for OT Re-Evaluation 03/03/15   OT Start Time 1346   OT Stop Time 1449   OT Time Calculation (min) 63 min   Activity Tolerance Patient tolerated treatment well   Behavior During Therapy [facility] for tasks assessed/performed      Past Medical History  Diagnosis Date  . Breast cancer ([place]) 17+ years ago    Right Breast c radiation -- 2015 Mastectomy  . Hypertension   . Cancer of breast ([place]) 01/20/2015    Past Surgical History  Procedure Laterality Date  . Mastectomy Right 2015    After screening in May 2015 - Pt choice  . Abdominal hysterectomy    . Breast lumpectomy Right 1998    c Radiation  . Joint replacement Right 2008    knee    There were no vitals filed for this visit.  Visit Diagnosis:  Postmastectomy lymphedema  Muscle spasm      Subjective Assessment - 02/16/15 1558    Subjective  Patient denies any pain or complaints, feels right thumb webspace is better and no pressure now with additional padding in place.  No significant itching this date.     Patient Stated Goals I do not want my arm to get bigger , and want to stay active - I am R hand dominant    Currently in Pain? No/denies   Pain Score 0-No pain             LYMPHEDEMA/ONCOLOGY QUESTIONNAIRE - 02/16/15 1356    Right Upper Extremity Lymphedema   10 cm Proximal to Olecranon Process (p) 23.6 cm   Olecranon Process (p) 23.1 cm   15 cm Proximal to Ulnar Styloid Process (p) 22.8 cm   10 cm Proximal to Ulnar Styloid Process (p) 20 cm   Just Proximal to Ulnar Styloid Process (p) 15 cm   Across Hand at  [employer] (p) 16.6 cm   At [place] of 2nd Digit (p) 5.8 cm   At Base of Thumb (p) 5.4 cm                 OT Treatments/Exercises (OP) - 02/16/15 1559    Manual Therapy   Manual Lymphatic Drainage (MLD) MLD performed - cervical ln on the L , axillary ln L and inguinal on the R 8 reps , AAA from R to L , R AI, then upper arm and rework , then inner upper arm and rework , then forearm palmar and dorsal with rework inbetween - then end with L axillar ln , L axillary ln , R ing ln 8 reps all    Compression Bandaging Bandage R UE - pt arrived with bandages intact , therapist removed bandages and associated padding and circumferential measurements taken and recorded on flow sheet.  - skincare performed with no redness noted this date, isotoner glove xsmall , nr 9 stockinette, 10 and 15 cm Artiflex hand to upper arm ; 6 cm short stretch bandage from wrist<>hand<>wrist; hand to elbow 8cm ; then 10 cm mid forearm to upper arm - topper of  Tubigrip D on Switched stockinette type this date due to redness, patient applied cortizone cream to upper arm.  Instructed patient to keep tubigrip pulled down over tape and not touching the skin which could have potentially caused irritation last time.                 OT Education - 02/16/15 1559    Education provided Yes   Education Details compression bandaging, MLD   Person(s) Educated Patient   Methods Explanation;Demonstration;Verbal cues   Comprehension Verbal cues required;Returned demonstration;Verbalized understanding          OT Short Term Goals - 02/03/15 1141    OT SHORT TERM GOAL #1   Title R UE circumference  decrease by 3 cm at least from wrist to upper arm to get measure for compression garments    Baseline R UE increase compare to L by 4 cm to 5.4 wrist to upper arm - hand increased 1 cm    Time 3   Period Weeks   Status New   OT SHORT TERM GOAL #2   Title Pt  L cervical rotation and lateral flexion improve to the same  than R side pain less than 3/10    Baseline 3/10 at the best and 7/10 at the worse over upper trap - L cervical rotation and lateral flexion decrease   Time 3   Period Weeks   Status New           OT Long Term Goals - 02/03/15 1145    OT LONG TERM GOAL #1   Title Pt to be ind in use of compression daytime sleeve and glove - as well as if needed night time compression sleeve    Baseline no garments yet   Time 4   Period Weeks   Status New   OT LONG TERM GOAL #2   Title Assess if pt needs compression pump to use 2 x day in stead of night time compression sleeve   Baseline no pump or garments    Time 4   Period Weeks   Status New               Plan - 02/16/15 1600    Clinical Impression Statement Patient demonstrated good progress with decongestion of the left upper extremity this date based on her circumferential measurements.  No itching this date and appears to be tolerating the change in stockinette material well.  Area on thumb improved and reports the grey foam piece has helped alot.  Will continue to work towards decongestion of the UE and prepare the arm for a custom compression garment for daytime.    Pt will benefit from skilled therapeutic intervention in order to improve on the following deficits (Retired) Pain;Impaired UE functional use;Increased edema;Impaired flexibility;Decreased knowledge of precautions   Rehab Potential Good   OT Frequency 3x / week   OT Duration 4 weeks   OT Treatment/Interventions Self-care/ADL training;Therapeutic exercise;Patient/family education;Manual lymph drainage;Passive range of motion;Compression bandaging   Consulted and Agree with Plan of Care Patient        Problem List Patient Active Problem List   Diagnosis Date Noted  . Cancer of breast ([place]) 01/20/2015  . BP (high blood pressure) 12/21/2014  . H/O malignant neoplasm of breast 12/21/2014   Achilles Dunk, OTR/L, CLT  Lindell Renfrew 02/17/2015, 9:13 PM  [place] MAIN [facility] SERVICES [address] [place], [place], [address] Phone: [phone]   Fax:  [phone]  Name: Hayley [name] MRN: [id number] Date of Birth: 1934-01-30

## 2015-02-17 NOTE — Progress Notes (Signed)
[place] @ [facility] [phone]  Fax:([phone] [place]: May 26, 1933  MR#: [id number]  RKY#:[id number]  Patient Care Team: Lenard Simmer, MD as PCP - General (Endocrinology)  CHIEF COMPLAINT:  Chief Complaint  Patient presents with  . Breast Cancer  1.patient had carcinoma of breast (right side) patient was operated at [facility] had radiation therapy.  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 Recent abnormaliity of  the nipple:exact duration not knownbiopsy was positive for invasive carcinoma Estrogen receptor positive.  Progesterone receptor positive.  HER-2 receptor negative status post mastectomy Skin involvement T4 B. N0 M0 tumor (June 2015) Patient refused any radiation chemotherapy or any other anti-hormonal therapy.  3.  Biopsy of right axillary lymph node is consistent with mammary carcinoma (November, 2016) Estrogen and progesterone receptor pending 4.  Patient has been started on [facility] and letrozole from November of 2016    INTERVAL HISTORY:  81 year old lady came today further follow-up.  Tolerating IBRANCE very well.  Right upper extremity lymphedema is improving. Patient is concerned about financial cost where she has to pay $3000 co-pay each month for [facility] therapy Also concerned about some of the lab data not being paid by insurance company No bony pain.  Appetite has been stable.  No nausea.  No vomiting.  No diarrhea.  REVIEW OF SYSTEMS:   GENERAL:  Feels good.  Active.  No fevers, sweats or weight loss. As mentioned in history of present illness patient had swelling of the right upper extremity PERFORMANCE STATUS (ECOG):  01 HEENT:  No visual changes, runny nose, sore throat, mouth sores or tenderness. Lungs: No shortness of breath or cough.  No hemoptysis. Cardiac:  No chest pain, palpitations, orthopnea, or PND. GI:  No nausea, vomiting, diarrhea, constipation, melena or hematochezia. GU:  No  urgency, frequency, dysuria, or hematuria. Musculoskeletal:  No back pain.  No joint pain.  No muscle tenderness. Extremities:  Right upper extremity swelling has improved Skin:  No rashes or skin changes. Neuro:  No headache, numbness or weakness, balance or coordination issues. Endocrine:  No diabetes, thyroid issues, hot flashes or night sweats. Psych:  No mood changes, depression or anxiety. Pain:  No focal pain. Review of systems:  All other systems reviewed and found to be negative. As per HPI. Otherwise, a complete review of systems is negatve.  PAST MEDICAL HISTORY: Past Medical History  Diagnosis Date  . Breast cancer ([place]) 17+ years ago    Right Breast c radiation -- 2015 Mastectomy  . Hypertension   . Cancer of breast ([place]) 01/20/2015    PAST SURGICAL HISTORY: Past Surgical History  Procedure Laterality Date  . Mastectomy Right 2015    After screening in May 2015 - Pt choice  . Abdominal hysterectomy    . Breast lumpectomy Right 1998    c Radiation  . Joint replacement Right 2008    knee    Significant History/PMH:   hysterctomy:    arthroscopy-right knee:    chicken pox:    breast cancer:    right total knee replacement: 25-Sep-2007   lumpectomy:   Preventive Screening:  Has patient had any of the following test? Mammography (1)   Last Mammography: May 2014(1)   Smoking History: Smoking History quit 50 years ago(1)  PFSH: Family History: noncontributory  Comments: No  family history of any malignancy  Social History: negative alcohol, negative tobacco  Additional Past Medical and Surgical History: As  mentioned above   ADVANCED DIRECTIVES:  Patient does have advance healthcare directive, Patient   does not desire to make any changes HEALTH MAINTENANCE: Social History  Substance Use Topics  . Smoking status: Never Smoker   . Smokeless tobacco: None  . Alcohol Use: No        OBJECTIVE:  Filed Vitals:   02/17/15 1013  BP: 166/68    Pulse: 79  Temp: 97.2 F (36.2 C)  Resp: 18     Body mass index is 19.15 kg/(m^2).    ECOG FS:1 - Symptomatic but completely ambulatory  PHYSICAL EXAM: GENERAL:  Well developed, well nourished, sitting comfortably in the exam room in no acute distress. MENTAL STATUS:  Alert and oriented to person, place and time.  ENT:  Oropharynx clear without lesion.  Tongue normal. Mucous membranes moist.  RESPIRATORY:  Clear to auscultation without rales, wheezes or rhonchi. CARDIOVASCULAR:  Regular rate and rhythm without murmur, rub or gallop. BREAST:  Right chest wall area there is no evidence of recurrent disease. ABDOMEN:  Soft, non-tender, with active bowel sounds, and no hepatosplenomegaly.  No masses. BACK:  No CVA tenderness.  No tenderness on percussion of the back or rib cage. SKIN:  No rashes, ulcers or lesions. EXTREMITIES: Right upper extremity lymphedema LYMPH NODES there are small palpable lymph node in the right supraclavicular area and the right side of the neck  NEUROLOGICAL: Unremarkable. PSYCH:  Appropriate.   LAB RESULTS:  CBC Latest Ref Rng 02/17/2015 12/21/2014  WBC 3.6 - 11.0 K/uL 1.8(L) 6.9  Hemoglobin 12.0 - 16.0 g/dL 13.7 14.3  Hematocrit 35.0 - 47.0 % 40.0 42.3  Platelets 150 - 440 K/uL 105(L) 202    Appointment on 02/17/2015  Component Date Value Ref Range Status  . WBC 02/17/2015 1.8* 3.6 - 11.0 K/uL Final  . RBC 02/17/2015 4.39  3.80 - 5.20 MIL/uL Final  . Hemoglobin 02/17/2015 13.7  12.0 - 16.0 g/dL Final  . HCT 02/17/2015 40.0  35.0 - 47.0 % Final  . MCV 02/17/2015 91.1  80.0 - 100.0 fL Final  . MCH 02/17/2015 31.2  26.0 - 34.0 pg Final  . MCHC 02/17/2015 34.2  32.0 - 36.0 g/dL Final  . RDW 02/17/2015 13.5  11.5 - 14.5 % Final  . Platelets 02/17/2015 105* 150 - 440 K/uL Final  . Neutrophils Relative % 02/17/2015 55   Final  . Neutro Abs 02/17/2015 1.0* 1.4 - 6.5 K/uL Final  . Lymphocytes Relative 02/17/2015 35   Final  . Lymphs Abs 02/17/2015 0.6*  1.0 - 3.6 K/uL Final  . Monocytes Relative 02/17/2015 7   Final  . Monocytes Absolute 02/17/2015 0.1* 0.2 - 0.9 K/uL Final  . Eosinophils Relative 02/17/2015 2   Final  . Eosinophils Absolute 02/17/2015 0.0  0 - 0.7 K/uL Final  . Basophils Relative 02/17/2015 1   Final  . Basophils Absolute 02/17/2015 0.0  0 - 0.1 K/uL Final  . Sodium 02/17/2015 133* 135 - 145 mmol/L Final  . Potassium 02/17/2015 4.2  3.5 - 5.1 mmol/L Final  . Chloride 02/17/2015 98* 101 - 111 mmol/L Final  . CO2 02/17/2015 26  22 - 32 mmol/L Final  . Glucose, Bld 02/17/2015 80  65 - 99 mg/dL Final  . BUN 02/17/2015 18  6 - 20 mg/dL Final  . Creatinine, Ser 02/17/2015 0.83  0.44 - 1.00 mg/dL Final  . Calcium 02/17/2015 9.3  8.9 - 10.3 mg/dL Final  . Total Protein 02/17/2015 7.0  6.5 -  8.1 g/dL Final  . Albumin 02/17/2015 4.4  3.5 - 5.0 g/dL Final  . AST 02/17/2015 24  15 - 41 U/L Final  . ALT 02/17/2015 25  14 - 54 U/L Final  . Alkaline Phosphatase 02/17/2015 39  38 - 126 U/L Final  . Total Bilirubin 02/17/2015 0.8  0.3 - 1.2 mg/dL Final  . GFR calc non Af Amer 02/17/2015 >60  >60 mL/min Final  . GFR calc Af Amer 02/17/2015 >60  >60 mL/min Final   Comment: (NOTE) The eGFR has been calculated using the CKD EPI equation. This calculation has not been validated in all clinical situations. eGFR's persistently <60 mL/min signify possible Chronic Kidney Disease.   . Anion gap 02/17/2015 9  5 - 15 Final    IIMPRESSION: Hypermetabolic right supraclavicular and right axillary lymphadenopathy, consistent with metastatic disease.  6 mm hypermetabolic mediastinal lymph node in the lateral aortic region, also suspicious for metastatic disease.  Bilateral posterior lower lobe pulmonary nodules with hypermetabolic activity, suspicious for pulmonary metastases.  No evidence of abdominal or pelvic metastatic disease.   ASSESSMENT: 1.  Patient had a history of ductal carcinoma in situ several years ago had lumpectomy  and radiation therapy to the right breast In June of 2015 patient has abnormal mammogram followed by mastectomy on the right side which was T4 N0 M0 tumor estrogen receptor positive.  Progesterone receptor positive.  HER-2 receptor negative Overall patient has been doing fairly good. Continue IBRANCE and letrozole Some of the patient's concern about financially she has been addressed at length.  Patient was advised to get social worker involvement  Myelosuppression secondary to [facility] Patient is afebrile and no dose adjustment would be done The patient's blood count continues to go down and then dose will be reduced   That of IBRANCE      was in agreement with this plan. She also understands that She can call clinic at any time with any questions, concerns, or complaints.    No matching staging information was found for the patient.  Forest Gleason, MD   02/17/2015 11:16 AM

## 2015-02-18 ENCOUNTER — Ambulatory Visit: Payer: Medicare HMO | Admitting: Occupational Therapy

## 2015-02-18 DIAGNOSIS — M62838 Other muscle spasm: Secondary | ICD-10-CM

## 2015-02-18 DIAGNOSIS — I972 Postmastectomy lymphedema syndrome: Secondary | ICD-10-CM

## 2015-02-18 NOTE — Therapy (Signed)
[place] MAIN [facility] SERVICES [address] [place], [place], [address] Phone: [phone]   Fax:  [phone]  Occupational Therapy Treatment  Patient Details  Name: Hayley [name] MRN: [id number] Date of Birth: December 04, 1933 No Data Recorded  Encounter Date: 02/18/2015      OT End of Session - 02/18/15 1603    Visit Number 7   Number of Visits 12   Date for OT Re-Evaluation 03/03/15   OT Start Time [id number]   OT Stop Time 1503   OT Time Calculation (min) 58 min   Activity Tolerance Patient tolerated treatment well   Behavior During Therapy [facility] for tasks assessed/performed      Past Medical History  Diagnosis Date  . Breast cancer ([place]) 17+ years ago    Right Breast c radiation -- 2015 Mastectomy  . Hypertension   . Cancer of breast ([place]) 01/20/2015    Past Surgical History  Procedure Laterality Date  . Mastectomy Right 2015    After screening in May 2015 - Pt choice  . Abdominal hysterectomy    . Breast lumpectomy Right 1998    c Radiation  . Joint replacement Right 2008    knee    There were no vitals filed for this visit.  Visit Diagnosis:  Postmastectomy lymphedema  Muscle spasm      Subjective Assessment - 02/18/15 1600    Subjective  Patient reports she is feeling OK, some tenderness at the elbow today and requesting to put foam back on this area.  Feels the padding at the thumb is much better.     Patient Stated Goals I do not want my arm to get bigger , and want to stay active - I am R hand dominant    Currently in Pain? No/denies   Pain Score 0-No pain             LYMPHEDEMA/ONCOLOGY QUESTIONNAIRE - 02/18/15 1421    Right Upper Extremity Lymphedema   15 cm Proximal to Olecranon Process (p) 25.6 cm   10 cm Proximal to Olecranon Process (p) 24.4 cm   Olecranon Process (p) 23.2 cm   15 cm Proximal to Ulnar Styloid Process (p) 22.4 cm   10 cm Proximal to Ulnar Styloid Process (p) 19.2 cm   Just Proximal  to Ulnar Styloid Process (p) 15.4 cm   Across Hand at [employer] (p) 18.6 cm   At [place] of 2nd Digit (p) 5.9 cm   At Base of Thumb (p) 5.4 cm   Other (p) 23.8  17 cm measurement on forearm                 OT Treatments/Exercises (OP) - 02/18/15 1602    Manual Therapy   Manual Lymphatic Drainage (MLD) MLD performed - cervical ln on the L , axillary ln L and inguinal on the R 8 reps , AAA from R to L , R AI, then upper arm and rework , then inner upper arm and rework , then forearm palmar and dorsal with rework inbetween - then end with L axillar ln , L axillary ln , R ing ln 8 reps all    Compression Bandaging Bandage R UE - pt arrived with bandages intact , therapist removed bandages and associated padding and circumferential measurements taken and recorded on flow sheet.  - skincare performed with no redness noted this date, isotoner glove xsmall , nr 9 stockinette, 10 and 15 cm Artiflex hand to  upper arm ; 6 cm short stretch bandage from wrist<>hand<>wrist; hand to elbow 8cm ; then 10 cm mid forearm to upper arm - topper of Tubigrip D on Switched stockinette type this date due to redness, patient applied cortizone cream to upper arm.  Instructed patient to keep tubigrip pulled down over tape and not touching the skin which could have potentially caused irritation last time. Adding rosidal foam just around elbow for additional padding.                OT Education - 02/18/15 1602    Education provided Yes   Education Details compression bandaging, MLD, exercises.   Person(s) Educated Patient   Methods Explanation;Demonstration;Verbal cues   Comprehension Verbal cues required;Returned demonstration;Verbalized understanding          OT Short Term Goals - 02/03/15 1141    OT SHORT TERM GOAL #1   Title R UE circumference  decrease by 3 cm at least from wrist to upper arm to get measure for compression garments    Baseline R UE increase compare to L by 4 cm to 5.4 wrist  to upper arm - hand increased 1 cm    Time 3   Period Weeks   Status New   OT SHORT TERM GOAL #2   Title Pt  L cervical rotation and lateral flexion improve to the same than R side pain less than 3/10    Baseline 3/10 at the best and 7/10 at the worse over upper trap - L cervical rotation and lateral flexion decrease   Time 3   Period Weeks   Status New           OT Long Term Goals - 02/03/15 1145    OT LONG TERM GOAL #1   Title Pt to be ind in use of compression daytime sleeve and glove - as well as if needed night time compression sleeve    Baseline no garments yet   Time 4   Period Weeks   Status New   OT LONG TERM GOAL #2   Title Assess if pt needs compression pump to use 2 x day in stead of night time compression sleeve   Baseline no pump or garments    Time 4   Period Weeks   Status New               Plan - 02/18/15 1603    Clinical Impression Statement Patient continues to show progress, still has a pocket of fluid lateral and medial aspects of elbow.  No irritation this date besides some tenderness at the right elbow.  Continue with comprehensive lymphedema treatment program and prepare RUE for custom compression garments to manage lymphedema.    Pt will benefit from skilled therapeutic intervention in order to improve on the following deficits (Retired) Pain;Impaired UE functional use;Increased edema;Impaired flexibility;Decreased knowledge of precautions   Rehab Potential Good   OT Frequency 3x / week   OT Duration 4 weeks   OT Treatment/Interventions Self-care/ADL training;Therapeutic exercise;Patient/family education;Manual lymph drainage;Passive range of motion;Compression bandaging   Consulted and Agree with Plan of Care Patient        Problem List Patient Active Problem List   Diagnosis Date Noted  . Cancer of breast ([place]) 01/20/2015  . BP (high blood pressure) 12/21/2014  . H/O malignant neoplasm of breast 12/21/2014   Jaionna Weisse T Rumaisa Schnetzer, OTR/L,  CLT  Adasia Hoar 02/18/2015, 4:09 PM  [place] [facility] [address]  [place], [place], [address] Phone: [phone]   Fax:  [phone]  Name: Hayley [name] MRN: [id number] Date of Birth: 1933/03/17

## 2015-02-21 ENCOUNTER — Telehealth: Payer: Self-pay | Admitting: *Deleted

## 2015-02-21 ENCOUNTER — Ambulatory Visit: Payer: Medicare HMO | Admitting: Occupational Therapy

## 2015-02-21 DIAGNOSIS — I972 Postmastectomy lymphedema syndrome: Secondary | ICD-10-CM

## 2015-02-21 DIAGNOSIS — M62838 Other muscle spasm: Secondary | ICD-10-CM

## 2015-02-21 NOTE — Telephone Encounter (Signed)
Needs a cavity filled asking if it is ok to have it done. Per Dr Jeb Levering, it is ok pt informed

## 2015-02-22 NOTE — Therapy (Signed)
[place] MAIN [facility] SERVICES [address] [place], [place], [address] Phone: [phone]   Fax:  [phone]  Occupational Therapy Treatment  Patient Details  Name: Hayley [name] MRN: [id number] Date of Birth: [birth year] No Data Recorded  Encounter Date: 02/21/2015      OT End of Session - 02/21/15 1657    Visit Number 8   Number of Visits 12   Date for OT Re-Evaluation 03/03/15   OT Start Time 1405   OT Stop Time 1510   OT Time Calculation (min) 65 min   Activity Tolerance Patient tolerated treatment well   Behavior During Therapy [facility] for tasks assessed/performed      Past Medical History  Diagnosis Date  . Breast cancer ([place]) 17+ years ago    Right Breast c radiation -- 2015 Mastectomy  . Hypertension   . Cancer of breast ([place]) 01/20/2015    Past Surgical History  Procedure Laterality Date  . Mastectomy Right 2015    After screening in May 2015 - Pt choice  . Abdominal hysterectomy    . Breast lumpectomy Right 1998    c Radiation  . Joint replacement Right 2008    knee    There were no vitals filed for this visit.  Visit Diagnosis:  Postmastectomy lymphedema  Muscle spasm      Subjective Assessment - 02/21/15 1655    Subjective  Patient pleased that area on forearm near elbow has decreased this past time with compression.  "My arm is looking smaller."  She had planned for her neice to come in with her but she was not able to make it today.    Patient Stated Goals I do not want my arm to get bigger , and want to stay active - I am R hand dominant    Currently in Pain? No/denies   Pain Score 0-No pain             LYMPHEDEMA/ONCOLOGY QUESTIONNAIRE - 02/21/15 1419    Right Upper Extremity Lymphedema   15 cm Proximal to Olecranon Process 26.2 cm   10 cm Proximal to Olecranon Process 24.2 cm   Olecranon Process 23 cm   15 cm Proximal to Ulnar Styloid Process 21.8 cm   10 cm Proximal to Ulnar Styloid  Process 19.6 cm   Just Proximal to Ulnar Styloid Process 15.2 cm   Across Hand at [employer] 18.3 cm   At [place] of 2nd Digit 5.7 cm   At [facility] of Thumb 5.5 cm   Other (p) 22.2                 OT Treatments/Exercises (OP) - 02/21/15 1658    Manual Therapy   Manual Lymphatic Drainage (MLD) MLD performed - cervical ln on the L , axillary ln L and inguinal on the R 8 reps , AAA from R to L , R AI, then upper arm and rework , then inner upper arm and rework , then forearm palmar and dorsal with rework inbetween - then end with L axillar ln , L axillary ln , R ing ln 8 reps all    Compression Bandaging Bandage R UE - pt arrived with bandages intact , therapist removed bandages and associated padding and circumferential measurements taken and recorded on flow sheet.  - skincare performed with no redness noted this date, isotoner glove xsmall , nr 9 stockinette, 10 and 15 cm Artiflex hand to upper arm ; 6 cm  short stretch bandage from wrist<>hand<>wrist; hand to elbow 8cm ; then 10 cm mid forearm to upper arm - topper of Tubigrip D on Switched stockinette type this date due to redness, patient applied cortizone cream to upper arm.  Instructed patient to keep tubigrip pulled down over tape and not touching the skin which could have potentially caused irritation last time. Adding rosidal foam just around elbow for additional padding.                OT Education - 02/21/15 1656    Education provided Yes   Education Details MLD, HEP, compression bandaging   Person(s) Educated Patient   Methods Explanation;Demonstration;Verbal cues   Comprehension Verbal cues required;Returned demonstration;Verbalized understanding          OT Short Term Goals - 02/03/15 1141    OT SHORT TERM GOAL #1   Title R UE circumference  decrease by 3 cm at least from wrist to upper arm to get measure for compression garments    Baseline R UE increase compare to L by 4 cm to 5.4 wrist to upper arm - hand  increased 1 cm    Time 3   Period Weeks   Status New   OT SHORT TERM GOAL #2   Title Pt  L cervical rotation and lateral flexion improve to the same than R side pain less than 3/10    Baseline 3/10 at the best and 7/10 at the worse over upper trap - L cervical rotation and lateral flexion decrease   Time 3   Period Weeks   Status New           OT Long Term Goals - 02/03/15 1145    OT LONG TERM GOAL #1   Title Pt to be ind in use of compression daytime sleeve and glove - as well as if needed night time compression sleeve    Baseline no garments yet   Time 4   Period Weeks   Status New   OT LONG TERM GOAL #2   Title Assess if pt needs compression pump to use 2 x day in stead of night time compression sleeve   Baseline no pump or garments    Time 4   Period Weeks   Status New               Plan - 02/22/15 1657    Clinical Impression Statement Patient demonstrated reductions in problem areas around the forearm near elbow, added 17cm measurement from ulnar styloid to monitor as well as area at the elbow medially.  She has continued to progress, has difficulty with attempting to wrap herself and is wanting her neice to come in with her to learn to wrap at home.  Will continue to focus on compression bandaging, MLD, therapeutic exercise and skin care to prepare the right arm for custom compression garments to manage her lymphedema.    Pt will benefit from skilled therapeutic intervention in order to improve on the following deficits (Retired) Pain;Impaired UE functional use;Increased edema;Impaired flexibility;Decreased knowledge of precautions   Rehab Potential Good   OT Frequency 3x / week   OT Duration 4 weeks   OT Treatment/Interventions Self-care/ADL training;Therapeutic exercise;Patient/family education;Manual lymph drainage;Passive range of motion;Compression bandaging   Consulted and Agree with Plan of Care Patient        Problem List Patient Active Problem List    Diagnosis Date Noted  . Cancer of breast ([place]) 01/20/2015  . BP (high blood pressure)  12/21/2014  . H/O malignant neoplasm of breast 12/21/2014   Achilles Dunk, OTR/L, CLT  Sheelah Ritacco 02/22/2015, 5:01 PM  [place] MAIN [facility] SERVICES [address] [place], [place], [address] Phone: [phone]   Fax:  [phone]  Name: Hayley [name] MRN: [id number] Date of Birth: 1933-12-02

## 2015-02-23 ENCOUNTER — Ambulatory Visit: Payer: Medicare HMO | Admitting: Occupational Therapy

## 2015-02-23 DIAGNOSIS — M62838 Other muscle spasm: Secondary | ICD-10-CM

## 2015-02-23 DIAGNOSIS — I972 Postmastectomy lymphedema syndrome: Secondary | ICD-10-CM

## 2015-02-24 NOTE — Therapy (Signed)
[place] MAIN [facility] SERVICES [address] [place], [place], [address] Phone: [phone]   Fax:  [phone]  Occupational Therapy Treatment  Patient Details  Name: Hayley [name] MRN: [id number] Date of Birth: 12-19-1933 No Data Recorded  Encounter Date: 02/23/2015      OT End of Session - 02/23/15 1500    Visit Number 9   Number of Visits 12   Date for OT Re-Evaluation 03/03/15   OT Start Time 1406   OT Stop Time 1505   OT Time Calculation (min) 59 min   Activity Tolerance Patient tolerated treatment well   Behavior During Therapy [facility] for tasks assessed/performed      Past Medical History  Diagnosis Date  . Breast cancer ([place]) 17+ years ago    Right Breast c radiation -- 2015 Mastectomy  . Hypertension   . Cancer of breast ([place]) 01/20/2015    Past Surgical History  Procedure Laterality Date  . Mastectomy Right 2015    After screening in May 2015 - Pt choice  . Abdominal hysterectomy    . Breast lumpectomy Right 1998    c Radiation  . Joint replacement Right 2008    knee    There were no vitals filed for this visit.  Visit Diagnosis:  Postmastectomy lymphedema  Muscle spasm      Subjective Assessment - 02/23/15 1500    Subjective  Patient's neice came in this date to be instructed on compression wrapping and MLD to assist patient at home with program.     Patient Stated Goals I do not want my arm to get bigger , and want to stay active - I am R hand dominant    Currently in Pain? No/denies   Pain Score 0-No pain             LYMPHEDEMA/ONCOLOGY QUESTIONNAIRE - 02/23/15 1420    Right Upper Extremity Lymphedema   15 cm Proximal to Olecranon Process 25.7 cm   10 cm Proximal to Olecranon Process 23.8 cm   Olecranon Process 23 cm   15 cm Proximal to Ulnar Styloid Process 21.8 cm   10 cm Proximal to Ulnar Styloid Process 19.3 cm   Just Proximal to Ulnar Styloid Process 15 cm   Across Hand at [employer] 17.4 cm   At [place] of 2nd Digit 5.6 cm   At [facility] of Thumb 5.4 cm   Other 22.5                 OT Treatments/Exercises (OP) - 02/23/15 1500    Manual Therapy   Manual Lymphatic Drainage (MLD) MLD performed - cervical ln on the L , axillary ln L and inguinal on the R 8 reps , AAA from R to L , R AI, then upper arm and rework , then inner upper arm and rework , then forearm palmar and dorsal with rework inbetween - then end with L axillar ln , L axillary ln , R ing ln 8 reps all    Compression Bandaging Bandage R UE - pt arrived with bandages intact , therapist removed bandages and associated padding and circumferential measurements taken and recorded on flow sheet.  - skincare performed with no redness noted this date, isotoner glove xsmall , nr 9 stockinette, 10 and 15 cm Artiflex hand to upper arm ; 6 cm short stretch bandage from wrist<>hand<>wrist; hand to elbow 8cm ; then 10 cm mid forearm to upper arm - topper of  Tubigrip D on Switched stockinette type this date due to redness, patient applied cortizone cream to upper arm.  Instructed patient to keep tubigrip pulled down over tape and not touching the skin which could have potentially caused irritation last time. Adding rosidal foam just around elbow for additional padding.Neice present this date and was instructed on compression wrapping verbally and therapist demo.  Will likely need to come in another visit to perform hands on wrapping to assist patient at home when needed. Will contact vendor and see when patient can be measured in the next week or so.                 OT Education - 02/23/15 1500    Education provided Yes   Education Details MLD, compression bandaging to patient and neice.   Person(s) Educated Patient;Other (comment)   Methods Explanation;Demonstration;Verbal cues   Comprehension Verbal cues required;Returned demonstration;Verbalized understanding          OT Short Term Goals - 02/03/15 1141     OT SHORT TERM GOAL #1   Title R UE circumference  decrease by 3 cm at least from wrist to upper arm to get measure for compression garments    Baseline R UE increase compare to L by 4 cm to 5.4 wrist to upper arm - hand increased 1 cm    Time 3   Period Weeks   Status New   OT SHORT TERM GOAL #2   Title Pt  L cervical rotation and lateral flexion improve to the same than R side pain less than 3/10    Baseline 3/10 at the best and 7/10 at the worse over upper trap - L cervical rotation and lateral flexion decrease   Time 3   Period Weeks   Status New           OT Long Term Goals - 02/03/15 1145    OT LONG TERM GOAL #1   Title Pt to be ind in use of compression daytime sleeve and glove - as well as if needed night time compression sleeve    Baseline no garments yet   Time 4   Period Weeks   Status New   OT LONG TERM GOAL #2   Title Assess if pt needs compression pump to use 2 x day in stead of night time compression sleeve   Baseline no pump or garments    Time 4   Period Weeks   Status New               Plan - 02/24/15 1148    Clinical Impression Statement Patient has continued to make progress and now right arm in within 1 cm difference in comparison the the left in most spots.  Will continue to work on problem areas around the elbow and forearm and will contact vendor to have patient measured for custom compression garment soon.  Neice came in for instruction on compression bandaging this date and was able to take notes from therapist verbal instruction and therapist demo but would benefit from coming in another day for hands on instruction.     Pt will benefit from skilled therapeutic intervention in order to improve on the following deficits (Retired) Pain;Impaired UE functional use;Increased edema;Impaired flexibility;Decreased knowledge of precautions   Rehab Potential Good   OT Frequency 3x / week   OT Treatment/Interventions Self-care/ADL training;Therapeutic  exercise;Patient/family education;Manual lymph drainage;Passive range of motion;Compression bandaging   Consulted and Agree with Plan of Care Patient  Problem List Patient Active Problem List   Diagnosis Date Noted  . Cancer of breast ([place]) 01/20/2015  . BP (high blood pressure) 12/21/2014  . H/O malignant neoplasm of breast 12/21/2014   Achilles Dunk, OTR/L, CLT Lovett,Amy 02/24/2015, 11:51 AM  [place] MAIN [facility] SERVICES [address] [place], [place], [address] Phone: [phone]   Fax:  [phone]  Name: Hayley [name] MRN: [id number] Date of Birth: August 25, 1933

## 2015-02-25 ENCOUNTER — Ambulatory Visit: Payer: Medicare HMO | Admitting: Occupational Therapy

## 2015-02-25 DIAGNOSIS — I972 Postmastectomy lymphedema syndrome: Secondary | ICD-10-CM | POA: Diagnosis not present

## 2015-02-25 DIAGNOSIS — M62838 Other muscle spasm: Secondary | ICD-10-CM

## 2015-02-25 NOTE — Therapy (Signed)
[place] MAIN [facility] SERVICES [address] [place], [place], [address] Phone: [phone]   Fax:  [phone]  Occupational Therapy Treatment/Progress Note Occupational Therapy Progress Note 02/03/2015 to 02/25/2015    Patient Details  Name: Hayley [name] MRN: [id number] Date of Birth: 1933-10-21 No Data Recorded  Encounter Date: 02/25/2015      OT End of Session - 02/25/15 1603    Visit Number 10   Number of Visits 12   Date for OT Re-Evaluation 03/03/15   OT Start Time 0930   OT Stop Time 1032   OT Time Calculation (min) 62 min   Activity Tolerance Patient tolerated treatment well   Behavior During Therapy [facility] for tasks assessed/performed      Past Medical History  Diagnosis Date  . Breast cancer ([place]) 17+ years ago    Right Breast c radiation -- 2015 Mastectomy  . Hypertension   . Cancer of breast ([place]) 01/20/2015    Past Surgical History  Procedure Laterality Date  . Mastectomy Right 2015    After screening in May 2015 - Pt choice  . Abdominal hysterectomy    . Breast lumpectomy Right 1998    c Radiation  . Joint replacement Right 2008    knee    There were no vitals filed for this visit.  Visit Diagnosis:  Postmastectomy lymphedema  Muscle spasm      Subjective Assessment - 02/25/15 1601    Subjective  Patient reports she is doing well, did a lot of things in the kitchen yesterday and really used the arm alot, had help from family to make soup.    Patient Stated Goals I do not want my arm to get bigger , and want to stay active - I am R hand dominant    Currently in Pain? No/denies   Pain Score 0-No pain             LYMPHEDEMA/ONCOLOGY QUESTIONNAIRE - 02/25/15 0942    Right Upper Extremity Lymphedema   15 cm Proximal to Olecranon Process 25.6 cm   10 cm Proximal to Olecranon Process 24 cm   Olecranon Process 23 cm   15 cm Proximal to Ulnar Styloid Process 22 cm   10 cm Proximal to Ulnar  Styloid Process 19.7 cm   Just Proximal to Ulnar Styloid Process 15.2 cm   Across Hand at [employer] 17.8 cm   At [place] of 2nd Digit 5.8 cm   At [facility] of Thumb 5.4 cm   Other 22.5                 OT Treatments/Exercises (OP) - 02/25/15 1602    Manual Therapy   Manual Lymphatic Drainage (MLD) MLD performed - cervical ln on the L , axillary ln L and inguinal on the R 8 reps , AAA from R to L , R AI, then upper arm and rework , then inner upper arm and rework , then forearm palmar and dorsal with rework inbetween - then end with L axillar ln , L axillary ln , R ing ln 8 reps all    Compression Bandaging Bandage R UE - pt arrived with bandages intact , therapist removed bandages and associated padding and circumferential measurements taken and recorded on flow sheet.  - skincare performed with no redness noted this date, isotoner glove xsmall , nr 9 stockinette, 10 and 15 cm Artiflex hand to upper arm ; 6 cm short stretch bandage from wrist<>hand<>wrist;  hand to elbow 8cm ; then 10 cm mid forearm to upper arm - topper of Tubigrip D on Switched stockinette type this date due to redness, patient applied cortizone cream to upper arm.  Instructed patient to keep tubigrip pulled down over tape and not touching the skin which could have potentially caused irritation last time. Adding rosidal foam just around elbow for additional padding.                OT Education - 27-Feb-2015 1602    Education provided Yes   Education Details HEP   Person(s) Educated Patient   Methods Explanation;Demonstration;Verbal cues   Comprehension Verbal cues required;Returned demonstration;Verbalized understanding          OT Short Term Goals - 02/03/15 1141    OT SHORT TERM GOAL #1   Title R UE circumference  decrease by 3 cm at least from wrist to upper arm to get measure for compression garments    Baseline R UE increase compare to L by 4 cm to 5.4 wrist to upper arm - hand increased 1 cm    Time 3    Period Weeks   Status New   OT SHORT TERM GOAL #2   Title Pt  L cervical rotation and lateral flexion improve to the same than R side pain less than 3/10    Baseline 3/10 at the best and 7/10 at the worse over upper trap - L cervical rotation and lateral flexion decrease   Time 3   Period Weeks   Status New           OT Long Term Goals - 02-27-15 1605    OT LONG TERM GOAL #1   Title Pt to be ind in use of compression daytime sleeve and glove - as well as if needed night time compression sleeve    Baseline no garments yet   Time 4   Period Weeks   Status On-going   OT LONG TERM GOAL #2   Title Assess if pt needs compression pump to use 2 x day in stead of night time compression sleeve   Baseline no pump or garments    Time 4   Period Weeks   Status On-going               Plan - February 27, 2015 1603    Clinical Impression Statement Patient continues to make significant progress with decongestion of her right UE.  She has a couple of areas which are about 1 cm difference than her non affected extremity and appears ready to be measured for her custom compression garments.  Vendor has been contacted and will have them measure patient as soon as possible.  She will require continued compression wrapping until garments arrive.  She continues to benefit from a comprehensive lymphedema treatment program to manage her lymphedema.    Pt will benefit from skilled therapeutic intervention in order to improve on the following deficits (Retired) Pain;Impaired UE functional use;Increased edema;Impaired flexibility;Decreased knowledge of precautions   Rehab Potential Good   OT Frequency 3x / week   OT Duration 4 weeks   OT Treatment/Interventions Self-care/ADL training;Therapeutic exercise;Patient/family education;Manual lymph drainage;Passive range of motion;Compression bandaging   Consulted and Agree with Plan of Care Patient          G-Codes - 02-27-15 1606    Functional Assessment  Tool Used circumference of R UE, pain scale , ROM , clinical judgement   Functional Limitation Self care   Self Care  Current Status [phone]) At least 20 percent but less than 40 percent impaired, limited or restricted   Self Care Goal Status [id number]) At least 1 percent but less than 20 percent impaired, limited or restricted      Problem List Patient Active Problem List   Diagnosis Date Noted  . Cancer of breast ([employer]) 01/20/2015  . BP (high blood pressure) 12/21/2014  . H/O malignant neoplasm of breast 12/21/2014   Achilles Dunk, OTR/L, CLT  Lovett,Amy 02/25/2015, 4:07 PM  [place] MAIN [facility] SERVICES [address] [place], [place], [address] Phone: [phone]   Fax:  [phone]  Name: Hayley [name] MRN: [id number] Date of Birth: 1933/10/28

## 2015-03-04 ENCOUNTER — Ambulatory Visit: Payer: Medicare HMO | Admitting: Occupational Therapy

## 2015-03-04 DIAGNOSIS — I972 Postmastectomy lymphedema syndrome: Secondary | ICD-10-CM

## 2015-03-04 NOTE — Therapy (Signed)
[place] MAIN [facility] SERVICES [address] [place], [place], [address] Phone: [phone]   Fax:  [phone]  Occupational Therapy Treatment  Patient Details  Name: Hayley [name] MRN: [id number] Date of Birth: September 03, 1933 No Data Recorded  Encounter Date: 03/04/2015      OT End of Session - 03/04/15 1509    Visit Number (p) 11   Number of Visits (p) 12   Date for OT Re-Evaluation (p) 03/03/15   OT Start Time (p) 1400   OT Stop Time (p) 1501   OT Time Calculation (min) (p) 61 min   Activity Tolerance (p) Patient tolerated treatment well   Behavior During Therapy (p) WFL for tasks assessed/performed      Past Medical History  Diagnosis Date  . Breast cancer ([place]) 17+ years ago    Right Breast c radiation -- 2015 Mastectomy  . Hypertension   . Cancer of breast ([place]) 01/20/2015    Past Surgical History  Procedure Laterality Date  . Mastectomy Right 2015    After screening in May 2015 - Pt choice  . Abdominal hysterectomy    . Breast lumpectomy Right 1998    c Radiation  . Joint replacement Right 2008    knee    There were no vitals filed for this visit.  Visit Diagnosis:  Postmastectomy lymphedema      Subjective Assessment - 03/04/15 1413    Subjective  Patient reports she tlerated compression during visit interval without difficulty using current modifications. Pt has no new complaints;.   Patient Stated Goals I do not want my arm to get bigger , and want to stay active - I am R hand dominant    Currently in Pain? No/denies   Pain Onset In the past 7 days                      OT Treatments/Exercises (OP) - 03/04/15 0001    Manual Therapy   Manual Therapy Other (comment)  skin care: Pt bathed RU with soap and water priorto reapplyi   Compression Bandaging Bange modiification this date on trial basis in effort to alleviate irritation at antecubital fossa, boney elbow prominence, and thumb web space:  cut thumb  stub out of glove. Applied 4 x 8 cm x 0.4 Rosidal foam circumferentially w/ 50% overlap from MPs  and palm to proximal arm in single layer over cotrton stockinette. First layer SS wrap: 6 Cm anchored at wrist w/ 2 figure eights over hand and wrist, then circumferentially  to proximal forearm. 2 and 3 wraps start at wrist and extend to proximal arm, w/ wedge shaped window over elbow prominance. Stretch net over all.                OT Education - 03/04/15 1258    Education provided Yes          OT Short Term Goals - 02/03/15 1141    OT SHORT TERM GOAL #1   Title R UE circumference  decrease by 3 cm at least from wrist to upper arm to get measure for compression garments    Baseline R UE increase compare to L by 4 cm to 5.4 wrist to upper arm - hand increased 1 cm    Time 3   Period Weeks   Status New   OT SHORT TERM GOAL #2   Title Pt  L cervical rotation and lateral flexion improve to the same than  R side pain less than 3/10    Baseline 3/10 at the best and 7/10 at the worse over upper trap - L cervical rotation and lateral flexion decrease   Time 3   Period Weeks   Status New           OT Long Term Goals - 02/25/15 1605    OT LONG TERM GOAL #1   Title Pt to be ind in use of compression daytime sleeve and glove - as well as if needed night time compression sleeve    Baseline no garments yet   Time 4   Period Weeks   Status On-going   OT LONG TERM GOAL #2   Title Assess if pt needs compression pump to use 2 x day in stead of night time compression sleeve   Baseline no pump or garments    Time 4   Period Weeks   Status On-going               Plan - 03/04/15 1258    Clinical Impression Statement Pt tolerated compression wraps and glove modifications applied today without difficulty. Pt agrees w/ plan to remove wraps this weekend and reapply w/ assistance as instructed by regular OT.   Pt will benefit from skilled therapeutic intervention in order  to improve on the following deficits (Retired) Pain;Impaired UE functional use;Increased edema;Impaired flexibility;Decreased knowledge of precautions   Rehab Potential Good   OT Frequency 3x / week   OT Duration 4 weeks   OT Treatment/Interventions Self-care/ADL training;Therapeutic exercise;Patient/family education;Manual lymph drainage;Passive range of motion;Compression bandaging   Consulted and Agree with Plan of Care Patient        Problem List Patient Active Problem List   Diagnosis Date Noted  . Cancer of breast ([place]) 01/20/2015  . BP (high blood pressure) 12/21/2014  . H/O malignant neoplasm of breast 12/21/2014    Andrey Spearman, MS, OTR/L, [facility] 03/04/2015 4:09 PM   [place] MAIN [facility] SERVICES [address] [place], [place], [address] Phone: [phone]   Fax:  [phone]  Name: Hayley [name] MRN: [id number] Date of Birth: April 20, 1933

## 2015-03-17 ENCOUNTER — Other Ambulatory Visit: Payer: Medicare HMO

## 2015-03-17 ENCOUNTER — Inpatient Hospital Stay: Payer: Medicare HMO | Attending: Oncology

## 2015-03-17 ENCOUNTER — Inpatient Hospital Stay (HOSPITAL_BASED_OUTPATIENT_CLINIC_OR_DEPARTMENT_OTHER): Payer: Medicare HMO | Admitting: Oncology

## 2015-03-17 ENCOUNTER — Ambulatory Visit: Payer: Medicare HMO | Admitting: Oncology

## 2015-03-17 VITALS — BP 138/72 | HR 87 | Temp 97.1°F | Resp 18 | Wt 116.6 lb

## 2015-03-17 DIAGNOSIS — Z79811 Long term (current) use of aromatase inhibitors: Secondary | ICD-10-CM | POA: Diagnosis not present

## 2015-03-17 DIAGNOSIS — Z87891 Personal history of nicotine dependence: Secondary | ICD-10-CM

## 2015-03-17 DIAGNOSIS — Z90710 Acquired absence of both cervix and uterus: Secondary | ICD-10-CM

## 2015-03-17 DIAGNOSIS — Z170 Estrogen receptor positive status [ER+]: Secondary | ICD-10-CM | POA: Diagnosis not present

## 2015-03-17 DIAGNOSIS — I972 Postmastectomy lymphedema syndrome: Secondary | ICD-10-CM

## 2015-03-17 DIAGNOSIS — C50011 Malignant neoplasm of nipple and areola, right female breast: Secondary | ICD-10-CM | POA: Diagnosis not present

## 2015-03-17 DIAGNOSIS — C773 Secondary and unspecified malignant neoplasm of axilla and upper limb lymph nodes: Secondary | ICD-10-CM | POA: Insufficient documentation

## 2015-03-17 DIAGNOSIS — Z9011 Acquired absence of right breast and nipple: Secondary | ICD-10-CM | POA: Insufficient documentation

## 2015-03-17 DIAGNOSIS — Z86000 Personal history of in-situ neoplasm of breast: Secondary | ICD-10-CM | POA: Insufficient documentation

## 2015-03-17 DIAGNOSIS — Z923 Personal history of irradiation: Secondary | ICD-10-CM | POA: Insufficient documentation

## 2015-03-17 DIAGNOSIS — I10 Essential (primary) hypertension: Secondary | ICD-10-CM | POA: Diagnosis not present

## 2015-03-17 LAB — BASIC METABOLIC PANEL
ANION GAP: 6 (ref 5–15)
BUN: 18 mg/dL (ref 6–20)
CHLORIDE: 99 mmol/L — AB (ref 101–111)
CO2: 27 mmol/L (ref 22–32)
Calcium: 9.1 mg/dL (ref 8.9–10.3)
Creatinine, Ser: 0.8 mg/dL (ref 0.44–1.00)
GFR calc Af Amer: >60 mL/min (ref >60)
GFR calc non Af Amer: >60 mL/min (ref >60)
GLUCOSE: 86 mg/dL (ref 65–99)
POTASSIUM: 4.2 mmol/L (ref 3.5–5.1)
Sodium: 132 mmol/L — ABNORMAL LOW (ref 135–145)

## 2015-03-17 LAB — CBC WITH DIFFERENTIAL/PLATELET
Basophils Absolute: 0 10*3/uL (ref 0–0.1)
Basophils Relative: 2 %
Eosinophils Absolute: 0 10*3/uL (ref 0–0.7)
Eosinophils Relative: 1 %
HEMATOCRIT: 38.8 % (ref 35.0–47.0)
HEMOGLOBIN: 13.4 g/dL (ref 12.0–16.0)
LYMPHS ABS: 0.6 10*3/uL — AB (ref 1.0–3.6)
LYMPHS PCT: 32 %
MCH: 32.5 pg (ref 26.0–34.0)
MCHC: 34.4 g/dL (ref 32.0–36.0)
MCV: 94.5 fL (ref 80.0–100.0)
Monocytes Absolute: 0.2 10*3/uL (ref 0.2–0.9)
Monocytes Relative: 9 %
NEUTROS ABS: 1.1 10*3/uL — AB (ref 1.4–6.5)
NEUTROS PCT: 56 %
Platelets: 125 10*3/uL — ABNORMAL LOW (ref 150–440)
RBC: 4.11 MIL/uL (ref 3.80–5.20)
RDW: 19.2 % — ABNORMAL HIGH (ref 11.5–14.5)
WBC: 2 10*3/uL — AB (ref 3.6–11.0)

## 2015-03-18 ENCOUNTER — Encounter: Payer: Self-pay | Admitting: Oncology

## 2015-03-18 NOTE — Progress Notes (Signed)
[place] @ [facility] Telephone:(336) [phone]  Fax:(336) [place]: 1933/03/15  MR#: [id number]  KPQ#:[id number]  Patient Care Team: Lenard Simmer, MD as PCP - General (Endocrinology)  CHIEF COMPLAINT:  Chief Complaint  Patient presents with  . Breast Cancer  1.patient had carcinoma of breast (right side) patient was operated at [facility] had radiation therapy.  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 Recent abnormaliity of  the nipple:exact duration not knownbiopsy was positive for invasive carcinoma Estrogen receptor positive.  Progesterone receptor positive.  HER-2 receptor negative status post mastectomy Skin involvement T4 B. N0 M0 tumor (June 2015) Patient refused any radiation chemotherapy or any other anti-hormonal therapy.  3.  Biopsy of right axillary lymph node is consistent with mammary carcinoma (November, 2016) Estrogen and progesterone receptor pending 4.  Patient has been started on [facility] and letrozole from November of 2016    INTERVAL HISTORY:  82 year old lady came today further follow-up.  Tolerating IBRANCE very well.  Right upper extremity lymphedema is improving. Patient is concerned about financial cost where she has to pay $3000 co-pay each month for [facility] therapy Also concerned about some of the lab data not being paid by insurance company No bony pain.  Appetite has been stable.  No nausea.  No vomiting.  No diarrhea. SHEENT is here for further follow-up and treatment consideration.  Tolerating letrozole and I Brandon's.  No chills.  No fever.  No nausea.  No vomiting.  No diarrhea. Patient is also undergoing lymphedema therapy.  Right upper extremity swelling is decreased.  REVIEW OF SYSTEMS:   GENERAL:  Feels good.  Active.  No fevers, sweats or weight loss. As mentioned in history of present illness patient had swelling of the right upper extremity PERFORMANCE STATUS (ECOG):  01 HEENT:  No visual  changes, runny nose, sore throat, mouth sores or tenderness. Lungs: No shortness of breath or cough.  No hemoptysis. Cardiac:  No chest pain, palpitations, orthopnea, or PND. GI:  No nausea, vomiting, diarrhea, constipation, melena or hematochezia. GU:  No urgency, frequency, dysuria, or hematuria. Musculoskeletal:  No back pain.  No joint pain.  No muscle tenderness. Extremities:  Right upper extremity swelling has improved Skin:  No rashes or skin changes. Neuro:  No headache, numbness or weakness, balance or coordination issues. Endocrine:  No diabetes, thyroid issues, hot flashes or night sweats. Psych:  No mood changes, depression or anxiety. Pain:  No focal pain. Review of systems:  All other systems reviewed and found to be negative. As per HPI. Otherwise, a complete review of systems is negatve.  PAST MEDICAL HISTORY: Past Medical History  Diagnosis Date  . Breast cancer ([place]) 17+ years ago    Right Breast c radiation -- 2015 Mastectomy  . Hypertension   . Cancer of breast ([place]) 01/20/2015    PAST SURGICAL HISTORY: Past Surgical History  Procedure Laterality Date  . Mastectomy Right 2015    After screening in May 2015 - Pt choice  . Abdominal hysterectomy    . Breast lumpectomy Right 1998    c Radiation  . Joint replacement Right 2008    knee    Significant History/PMH:   hysterctomy:    arthroscopy-right knee:    chicken pox:    breast cancer:    right total knee replacement: 25-Sep-2007   lumpectomy:   Preventive Screening:  Has patient had any of the following test? Mammography (1)  Last Mammography: May 2014(1)   Smoking History: Smoking History quit 50 years ago(1)  PFSH: Family History: noncontributory  Comments: No  family history of any malignancy  Social History: negative alcohol, negative tobacco  Additional Past Medical and Surgical History: As mentioned above   ADVANCED DIRECTIVES:  Patient does have advance healthcare directive,  Patient   does not desire to make any changes HEALTH MAINTENANCE: Social History  Substance Use Topics  . Smoking status: Never Smoker   . Smokeless tobacco: None  . Alcohol Use: No        OBJECTIVE:  Filed Vitals:   03/17/15 1024  BP: 138/72  Pulse: 87  Temp: 97.1 F (36.2 C)  Resp: 18     Body mass index is 19.41 kg/(m^2).    ECOG FS:1 - Symptomatic but completely ambulatory  PHYSICAL EXAM: GENERAL:  Well developed, well nourished, sitting comfortably in the exam room in no acute distress. MENTAL STATUS:  Alert and oriented to person, place and time.  ENT:  Oropharynx clear without lesion.  Tongue normal. Mucous membranes moist.  RESPIRATORY:  Clear to auscultation without rales, wheezes or rhonchi. CARDIOVASCULAR:  Regular rate and rhythm without murmur, rub or gallop. BREAST:  Right chest wall area there is no evidence of recurrent disease. ABDOMEN:  Soft, non-tender, with active bowel sounds, and no hepatosplenomegaly.  No masses. BACK:  No CVA tenderness.  No tenderness on percussion of the back or rib cage. SKIN:  No rashes, ulcers or lesions. EXTREMITIES: Right upper extremity lymphedema LYMPH NODES there are small palpable lymph node in the right supraclavicular area and the right side of the neck  NEUROLOGICAL: Unremarkable. PSYCH:  Appropriate.   LAB RESULTS:  CBC Latest Ref Rng 03/17/2015 02/17/2015  WBC 3.6 - 11.0 K/uL 2.0(L) 1.8(L)  Hemoglobin 12.0 - 16.0 g/dL 13.4 13.7  Hematocrit 35.0 - 47.0 % 38.8 40.0  Platelets 150 - 440 K/uL 125(L) 105(L)    Appointment on 03/17/2015  Component Date Value Ref Range Status  . WBC 03/17/2015 2.0* 3.6 - 11.0 K/uL Final  . RBC 03/17/2015 4.11  3.80 - 5.20 MIL/uL Final  . Hemoglobin 03/17/2015 13.4  12.0 - 16.0 g/dL Final  . HCT 03/17/2015 38.8  35.0 - 47.0 % Final  . MCV 03/17/2015 94.5  80.0 - 100.0 fL Final  . MCH 03/17/2015 32.5  26.0 - 34.0 pg Final  . MCHC 03/17/2015 34.4  32.0 - 36.0 g/dL Final  . RDW  03/17/2015 19.2* 11.5 - 14.5 % Final  . Platelets 03/17/2015 125* 150 - 440 K/uL Final  . Neutrophils Relative % 03/17/2015 56   Final  . Neutro Abs 03/17/2015 1.1* 1.4 - 6.5 K/uL Final  . Lymphocytes Relative 03/17/2015 32   Final  . Lymphs Abs 03/17/2015 0.6* 1.0 - 3.6 K/uL Final  . Monocytes Relative 03/17/2015 9   Final  . Monocytes Absolute 03/17/2015 0.2  0.2 - 0.9 K/uL Final  . Eosinophils Relative 03/17/2015 1   Final  . Eosinophils Absolute 03/17/2015 0.0  0 - 0.7 K/uL Final  . Basophils Relative 03/17/2015 2   Final  . Basophils Absolute 03/17/2015 0.0  0 - 0.1 K/uL Final  . Sodium 03/17/2015 132* 135 - 145 mmol/L Final  . Potassium 03/17/2015 4.2  3.5 - 5.1 mmol/L Final  . Chloride 03/17/2015 99* 101 - 111 mmol/L Final  . CO2 03/17/2015 27  22 - 32 mmol/L Final  . Glucose, Bld 03/17/2015 86  65 - 99 mg/dL Final  .  BUN 03/17/2015 18  6 - 20 mg/dL Final  . Creatinine, Ser 03/17/2015 0.80  0.44 - 1.00 mg/dL Final  . Calcium 03/17/2015 9.1  8.9 - 10.3 mg/dL Final  . GFR calc non Af Amer 03/17/2015 >60  >60 mL/min Final  . GFR calc Af Amer 03/17/2015 >60  >60 mL/min Final   Comment: (NOTE) The eGFR has been calculated using the CKD EPI equation. This calculation has not been validated in all clinical situations. eGFR's persistently <60 mL/min signify possible Chronic Kidney Disease.   . Anion gap 03/17/2015 6  5 - 15 Final    IIMPRESSION: Hypermetabolic right supraclavicular and right axillary lymphadenopathy, consistent with metastatic disease.  6 mm hypermetabolic mediastinal lymph node in the lateral aortic region, also suspicious for metastatic disease.  Bilateral posterior lower lobe pulmonary nodules with hypermetabolic activity, suspicious for pulmonary metastases.  No evidence of abdominal or pelvic metastatic disease.   ASSESSMENT: 1.  Patient had a history of ductal carcinoma in situ several years ago had lumpectomy and radiation therapy to the right  breast. If edema is decrease in right upper extremity. Lymphedema therapy has been reviewed.  As recommended intermittent compression has been approved by physician.  Waiting for insurance approval.  Clinically palpable lymph node is decrease in size Myelosuppression secondary to [facility] Patient is afebrile and no dose adjustment would be done The patient's blood count continues to go down and then dose will be reduced   That of IBRANCE  Patient was instructed regarding fever.  If she develops any high fever chills to call me right away.  Absolute neutrophil count is 1100. Your no dose reduction at present time but will be carefully followed     was in agreement with this plan. She also understands that She can call clinic at any time with any questions, concerns, or complaints.    No matching staging information was found for the patient.  Forest Gleason, MD   03/18/2015 8:04 AM

## 2015-03-21 ENCOUNTER — Ambulatory Visit: Payer: Medicare HMO | Attending: Oncology | Admitting: Occupational Therapy

## 2015-03-21 DIAGNOSIS — I972 Postmastectomy lymphedema syndrome: Secondary | ICD-10-CM | POA: Diagnosis not present

## 2015-03-21 DIAGNOSIS — M62838 Other muscle spasm: Secondary | ICD-10-CM | POA: Insufficient documentation

## 2015-03-23 NOTE — Therapy (Signed)
[place] MAIN [facility] SERVICES [address] [place], [place], [address] Phone: [phone]   Fax:  [phone]  Occupational Therapy Treatment  Patient Details  Name: Hayley [name] MRN: [id number] Date of Birth: 04-25-1933 No Data Recorded  Encounter Date: 03/21/2015      OT End of Session - 03/22/15 1748    Visit Number 11   Number of Visits 17   Date for OT Re-Evaluation 05/02/15   OT Start Time 1330   OT Stop Time 1425   OT Time Calculation (min) 55 min   Equipment Utilized During Treatment compression garment donner   Activity Tolerance Patient tolerated treatment well   Behavior During Therapy [facility] for tasks assessed/performed      Past Medical History  Diagnosis Date  . Breast cancer ([place]) 17+ years ago    Right Breast c radiation -- 2015 Mastectomy  . Hypertension   . Cancer of breast ([place]) 01/20/2015    Past Surgical History  Procedure Laterality Date  . Mastectomy Right 2015    After screening in May 2015 - Pt choice  . Abdominal hysterectomy    . Breast lumpectomy Right 1998    c Radiation  . Joint replacement Right 2008    knee    There were no vitals filed for this visit.  Visit Diagnosis:  Postmastectomy lymphedema - Plan: Ot plan of care cert/re-cert  Muscle spasm - Plan: Ot plan of care cert/re-cert      Subjective Assessment - 03/22/15 1741    Subjective  Patient reports she received her custom compression garment at the end of last week and wore it over the weekend,  She has washed it according to the manufacturers guidelines.  She is reporting difficulty with donning and doffing of the garment, also unsure if length is appropriate with some irritation at the inner elbow and at the arm pit.     Patient Stated Goals I do not want my arm to get bigger , and want to stay active - I am R hand dominant    Currently in Pain? Yes   Pain Score 1    Pain Location Elbow   Pain Orientation Right   Pain  Descriptors / Indicators Aching   Pain Type Acute pain   Pain Onset In the past 7 days   Multiple Pain Sites No                      OT Treatments/Exercises (OP) - 03/23/15 0001    ADLs   ADL Comments Patient seen this date for assessment of custom compression garment fit, donning and doffing of garments.  Patient instructed on a variety of adaptive equipment to don and doff garrments, prefers to use donning frame out of all the options.  Patient instructed on wear and care of garments, options for night time including directions on use of compression pump which she should receive this week.                 OT Education - 03/22/15 1747    Education provided Yes   Education Details custom compression garment wear, care and schedule.  HEP, use of pump as adjunct to current plan, use of adaptive equipment for donning compression garment.   Person(s) Educated Patient   Methods Explanation;Demonstration;Tactile cues;Verbal cues   Comprehension Verbal cues required;Returned demonstration;Verbalized understanding;Tactile cues required          OT Short Term Goals - 02/03/15  [place] #1   Title R UE circumference  decrease by 3 cm at least from wrist to upper arm to get measure for compression garments    Baseline R UE increase compare to L by 4 cm to 5.4 wrist to upper arm - hand increased 1 cm    Time 3   Period Weeks   Status New   OT SHORT TERM GOAL #2   Title Pt  L cervical rotation and lateral flexion improve to the same than R side pain less than 3/10    Baseline 3/10 at the best and 7/10 at the worse over upper trap - L cervical rotation and lateral flexion decrease   Time 3   Period Weeks   Status New           OT Long Term Goals - 03/23/15 1755    OT LONG TERM GOAL #1   Title Pt to be ind in use of compression daytime sleeve and glove - as well as if needed night time compression sleeve    Baseline patient demos difficulty  donning and will need continued instruction on adaptive equipment   Time 6   Period Weeks   Status On-going   OT LONG TERM GOAL #2   Title Assess if pt needs compression pump to use 2 x day in stead of night time compression sleeve   Baseline patient to receive pump this week.   Time 6   Period Weeks   Status On-going               Plan - 03/22/15 1749    Clinical Impression Statement Assessed the fit of patients custom compression garments, Elvarex soft sleeve and glove for RUE and it appears the sleeve is too long, coming up into the armpit area and causing irritation, the length is also causing some bunching of material at the elbow and patient is experiencing some redness and irritation at the crease of the elbow.  Contacted vendor who is planning to come and remeasure patient for a garment remake next week.  Patient is aware she will need to continue to keep compression on the arm and hand while waiting.  She has limited hand strength and is demonstrating difficulty with donning and doffing of garments, stating it took her 45 minutes just to get in on this am.  She was able to demo use of stocking donner for applying garment to RUE with minimal cues.  Vendor ordering stocking donner for patient to use at home but will lend her one in the meantime while she is waiting for it to arrive.  She will be receiving her compression pump this week to use as and adjunct to her daytime custom compression garments.  Patient would continue to benefit from  skilled OT to have remake of garments for improved fit, education for improving donning and doffing of garments, as well as any instruction on use of pump which she should receive this week.    Pt will benefit from skilled therapeutic intervention in order to improve on the following deficits (Retired) Pain;Impaired UE functional use;Increased edema;Impaired flexibility;Decreased knowledge of precautions   Rehab Potential Good   OT Frequency 1x /  week   OT Duration 6 weeks   OT Treatment/Interventions Self-care/ADL training;Therapeutic exercise;Patient/family education;Manual lymph drainage;Passive range of motion;Compression bandaging   Consulted and Agree with Plan of Care Patient        Problem List Patient Active Problem  List   Diagnosis Date Noted  . Cancer of breast ([place]) 01/20/2015  . BP (high blood pressure) 12/21/2014  . H/O malignant neoplasm of breast 12/21/2014   Achilles Dunk, OTR/L, CLT  Lovett,Amy 03/23/2015, 6:02 PM  [place] MAIN [facility] SERVICES [address] [place], [place], [address] Phone: [phone]   Fax:  [phone]  Name: Hayley [name] MRN: [id number] Date of Birth: 1933/05/24

## 2015-04-14 ENCOUNTER — Inpatient Hospital Stay: Payer: Medicare HMO | Attending: Oncology

## 2015-04-14 ENCOUNTER — Inpatient Hospital Stay (HOSPITAL_BASED_OUTPATIENT_CLINIC_OR_DEPARTMENT_OTHER): Payer: Medicare HMO | Admitting: Oncology

## 2015-04-14 ENCOUNTER — Encounter: Payer: Self-pay | Admitting: Oncology

## 2015-04-14 VITALS — BP 178/75 | HR 80 | Temp 97.2°F | Resp 18 | Wt 117.3 lb

## 2015-04-14 DIAGNOSIS — Z87891 Personal history of nicotine dependence: Secondary | ICD-10-CM | POA: Diagnosis not present

## 2015-04-14 DIAGNOSIS — C50011 Malignant neoplasm of nipple and areola, right female breast: Secondary | ICD-10-CM | POA: Insufficient documentation

## 2015-04-14 DIAGNOSIS — Z9011 Acquired absence of right breast and nipple: Secondary | ICD-10-CM | POA: Insufficient documentation

## 2015-04-14 DIAGNOSIS — I972 Postmastectomy lymphedema syndrome: Secondary | ICD-10-CM | POA: Insufficient documentation

## 2015-04-14 DIAGNOSIS — Z170 Estrogen receptor positive status [ER+]: Secondary | ICD-10-CM | POA: Diagnosis not present

## 2015-04-14 DIAGNOSIS — Z79811 Long term (current) use of aromatase inhibitors: Secondary | ICD-10-CM | POA: Insufficient documentation

## 2015-04-14 DIAGNOSIS — Z79899 Other long term (current) drug therapy: Secondary | ICD-10-CM | POA: Diagnosis not present

## 2015-04-14 DIAGNOSIS — I10 Essential (primary) hypertension: Secondary | ICD-10-CM | POA: Insufficient documentation

## 2015-04-14 DIAGNOSIS — Z86000 Personal history of in-situ neoplasm of breast: Secondary | ICD-10-CM

## 2015-04-14 DIAGNOSIS — D759 Disease of blood and blood-forming organs, unspecified: Secondary | ICD-10-CM | POA: Insufficient documentation

## 2015-04-14 DIAGNOSIS — C773 Secondary and unspecified malignant neoplasm of axilla and upper limb lymph nodes: Secondary | ICD-10-CM | POA: Diagnosis not present

## 2015-04-14 DIAGNOSIS — Z923 Personal history of irradiation: Secondary | ICD-10-CM

## 2015-04-14 LAB — CBC WITH DIFFERENTIAL/PLATELET
Basophils Absolute: 0.1 10*3/uL (ref 0–0.1)
Basophils Relative: 3 %
Eosinophils Absolute: 0 10*3/uL (ref 0–0.7)
Eosinophils Relative: 2 %
HEMATOCRIT: 36.6 % (ref 35.0–47.0)
HEMOGLOBIN: 12.7 g/dL (ref 12.0–16.0)
LYMPHS ABS: 0.5 10*3/uL — AB (ref 1.0–3.6)
LYMPHS PCT: 29 %
MCH: 34.5 pg — ABNORMAL HIGH (ref 26.0–34.0)
MCHC: 34.8 g/dL (ref 32.0–36.0)
MCV: 98.9 fL (ref 80.0–100.0)
MONO ABS: 0.2 10*3/uL (ref 0.2–0.9)
MONOS PCT: 10 %
NEUTROS ABS: 1.1 10*3/uL — AB (ref 1.4–6.5)
NEUTROS PCT: 56 %
Platelets: 129 10*3/uL — ABNORMAL LOW (ref 150–440)
RBC: 3.7 MIL/uL — ABNORMAL LOW (ref 3.80–5.20)
RDW: 21.7 % — AB (ref 11.5–14.5)
WBC: 1.9 10*3/uL — ABNORMAL LOW (ref 3.6–11.0)

## 2015-04-14 LAB — COMPREHENSIVE METABOLIC PANEL
ALK PHOS: 44 U/L (ref 38–126)
ALT: 31 U/L (ref 14–54)
ANION GAP: 10 (ref 5–15)
AST: 25 U/L (ref 15–41)
Albumin: 4.2 g/dL (ref 3.5–5.0)
BILIRUBIN TOTAL: 0.6 mg/dL (ref 0.3–1.2)
BUN: 16 mg/dL (ref 6–20)
CALCIUM: 9.4 mg/dL (ref 8.9–10.3)
CO2: 26 mmol/L (ref 22–32)
Chloride: 101 mmol/L (ref 101–111)
Creatinine, Ser: 0.75 mg/dL (ref 0.44–1.00)
GFR calc Af Amer: >60 mL/min (ref >60)
GLUCOSE: 83 mg/dL (ref 65–99)
POTASSIUM: 4.1 mmol/L (ref 3.5–5.1)
Sodium: 137 mmol/L (ref 135–145)
TOTAL PROTEIN: 6.9 g/dL (ref 6.5–8.1)

## 2015-04-14 MED ORDER — PALBOCICLIB 100 MG PO CAPS: 100.0000 mg | ORAL_CAPSULE | Freq: Every day | ORAL | Status: DC

## 2015-04-14 NOTE — Progress Notes (Signed)
Patient states she is having a lot of indigestion.  States she does not want a medication for it.

## 2015-04-15 LAB — CANCER ANTIGEN 27.29: CA 27.29: 48 U/mL — ABNORMAL HIGH (ref 0.0–38.6)

## 2015-04-16 ENCOUNTER — Encounter: Payer: Self-pay | Admitting: Oncology

## 2015-04-16 NOTE — Progress Notes (Signed)
[place] @ [facility] [phone]  Fax:(336) [place]: 1933/05/18  MR#: [id number]  BPZ#:[id number]  Patient Care Team: Lenard Simmer, MD as PCP - General (Endocrinology)  CHIEF COMPLAINT:  Chief Complaint  Patient presents with  . Breast Cancer  1.patient had carcinoma of breast (right side) patient was operated at [facility] had radiation therapy.  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 Recent abnormaliity of  the nipple:exact duration not knownbiopsy was positive for invasive carcinoma Estrogen receptor positive.  Progesterone receptor positive.  HER-2 receptor negative status post mastectomy Skin involvement T4 B. N0 M0 tumor (June 2015) Patient refused any radiation chemotherapy or any other anti-hormonal therapy.  3.  Biopsy of right axillary lymph node is consistent with mammary carcinoma (November, 2016) Estrogen and progesterone receptor pending 4.  Patient has been started on [facility] and letrozole from November of 2016    INTERVAL HISTORY:  82 year old lady came today further follow-up.  Tolerating IBRANCE very well.  Right upper extremity lymphedema is improving. Patient is concerned about financial cost where she has to pay $3000 co-pay each month for [facility] therapy Also concerned about some of the lab data not being paid by insurance company No bony pain.  Appetite has been stable.  No nausea.  No vomiting.  No diarrhea. SHEENT is here for further follow-up and treatment consideration.  Tolerating letrozole and I Brandon's.  No chills.  No fever.  No nausea.  No vomiting.  No diarrhea. Patient is also undergoing lymphedema therapy.  Right upper extremity swelling is decreased. The patient is here for further follow-up.  As a borderline myelosuppression.  Lymphedema is improving.  No other complaint is some joint pain and no back pain or any other bony pains   REVIEW OF SYSTEMS:   GENERAL:  Feels good.   Active.  No fevers, sweats or weight loss. As mentioned in history of present illness patient had swelling of the right upper extremity PERFORMANCE STATUS (ECOG):  01 HEENT:  No visual changes, runny nose, sore throat, mouth sores or tenderness. Lungs: No shortness of breath or cough.  No hemoptysis. Cardiac:  No chest pain, palpitations, orthopnea, or PND. GI:  No nausea, vomiting, diarrhea, constipation, melena or hematochezia. GU:  No urgency, frequency, dysuria, or hematuria. Musculoskeletal:  No back pain.  No joint pain.  No muscle tenderness. Extremities:  Right upper extremity swelling has improved Skin:  No rashes or skin changes. Neuro:  No headache, numbness or weakness, balance or coordination issues. Endocrine:  No diabetes, thyroid issues, hot flashes or night sweats. Psych:  No mood changes, depression or anxiety. Pain:  No focal pain. Review of systems:  All other systems reviewed and found to be negative. As per HPI. Otherwise, a complete review of systems is negatve.  PAST MEDICAL HISTORY: Past Medical History  Diagnosis Date  . Breast cancer ([place]) 17+ years ago    Right Breast c radiation -- 2015 Mastectomy  . Hypertension   . Cancer of breast ([place]) 01/20/2015    PAST SURGICAL HISTORY: Past Surgical History  Procedure Laterality Date  . Mastectomy Right 2015    After screening in May 2015 - Pt choice  . Abdominal hysterectomy    . Breast lumpectomy Right 1998    c Radiation  . Joint replacement Right 2008    knee    Significant History/PMH:   hysterctomy:    arthroscopy-right knee:    chicken  pox:    breast cancer:    right total knee replacement: 25-Sep-2007   lumpectomy:   Preventive Screening:  Has patient had any of the following test? Mammography (1)   Last Mammography: May 2014(1)   Smoking History: Smoking History quit 50 years ago(1)  PFSH: Family History: noncontributory  Comments: No  family history of any malignancy    Social History: negative alcohol, negative tobacco  Additional Past Medical and Surgical History: As mentioned above   ADVANCED DIRECTIVES:  Patient does have advance healthcare directive, Patient   does not desire to make any changes HEALTH MAINTENANCE: Social History  Substance Use Topics  . Smoking status: Never Smoker   . Smokeless tobacco: None  . Alcohol Use: No        OBJECTIVE:  Filed Vitals:   04/14/15 0947  BP: 178/75  Pulse: 80  Temp: 97.2 F (36.2 C)  Resp: 18     Body mass index is 19.52 kg/(m^2).    ECOG FS:1 - Symptomatic but completely ambulatory  PHYSICAL EXAM: GENERAL:  Well developed, well nourished, sitting comfortably in the exam room in no acute distress. MENTAL STATUS:  Alert and oriented to person, place and time.  ENT:  Oropharynx clear without lesion.  Tongue normal. Mucous membranes moist.  RESPIRATORY:  Clear to auscultation without rales, wheezes or rhonchi. CARDIOVASCULAR:  Regular rate and rhythm without murmur, rub or gallop. BREAST:  Right chest wall area there is no evidence of recurrent disease. ABDOMEN:  Soft, non-tender, with active bowel sounds, and no hepatosplenomegaly.  No masses. BACK:  No CVA tenderness.  No tenderness on percussion of the back or rib cage. SKIN:  No rashes, ulcers or lesions. EXTREMITIES: Right upper extremity lymphedema LYMPH NODES there are small palpable lymph node in the right supraclavicular area and the right side of the neck  NEUROLOGICAL: Unremarkable. PSYCH:  Appropriate.   LAB RESULTS:  CBC Latest Ref Rng 04/14/2015 03/17/2015  WBC 3.6 - 11.0 K/uL 1.9(L) 2.0(L)  Hemoglobin 12.0 - 16.0 g/dL 12.7 13.4  Hematocrit 35.0 - 47.0 % 36.6 38.8  Platelets 150 - 440 K/uL 129(L) 125(L)    Appointment on 04/14/2015  Component Date Value Ref Range Status  . WBC 04/14/2015 1.9* 3.6 - 11.0 K/uL Final  . RBC 04/14/2015 3.70* 3.80 - 5.20 MIL/uL Final  . Hemoglobin 04/14/2015 12.7  12.0 - 16.0 g/dL Final  .  HCT 04/14/2015 36.6  35.0 - 47.0 % Final  . MCV 04/14/2015 98.9  80.0 - 100.0 fL Final  . MCH 04/14/2015 34.5* 26.0 - 34.0 pg Final  . MCHC 04/14/2015 34.8  32.0 - 36.0 g/dL Final  . RDW 04/14/2015 21.7* 11.5 - 14.5 % Final  . Platelets 04/14/2015 129* 150 - 440 K/uL Final  . Neutrophils Relative % 04/14/2015 56   Final  . Neutro Abs 04/14/2015 1.1* 1.4 - 6.5 K/uL Final  . Lymphocytes Relative 04/14/2015 29   Final  . Lymphs Abs 04/14/2015 0.5* 1.0 - 3.6 K/uL Final  . Monocytes Relative 04/14/2015 10   Final  . Monocytes Absolute 04/14/2015 0.2  0.2 - 0.9 K/uL Final  . Eosinophils Relative 04/14/2015 2   Final  . Eosinophils Absolute 04/14/2015 0.0  0 - 0.7 K/uL Final  . Basophils Relative 04/14/2015 3   Final  . Basophils Absolute 04/14/2015 0.1  0 - 0.1 K/uL Final  . Sodium 04/14/2015 137  135 - 145 mmol/L Final  . Potassium 04/14/2015 4.1  3.5 - 5.1 mmol/L Final  .  Chloride 04/14/2015 101  101 - 111 mmol/L Final  . CO2 04/14/2015 26  22 - 32 mmol/L Final  . Glucose, Bld 04/14/2015 83  65 - 99 mg/dL Final  . BUN 04/14/2015 16  6 - 20 mg/dL Final  . Creatinine, Ser 04/14/2015 0.75  0.44 - 1.00 mg/dL Final  . Calcium 04/14/2015 9.4  8.9 - 10.3 mg/dL Final  . Total Protein 04/14/2015 6.9  6.5 - 8.1 g/dL Final  . Albumin 04/14/2015 4.2  3.5 - 5.0 g/dL Final  . AST 04/14/2015 25  15 - 41 U/L Final  . ALT 04/14/2015 31  14 - 54 U/L Final  . Alkaline Phosphatase 04/14/2015 44  38 - 126 U/L Final  . Total Bilirubin 04/14/2015 0.6  0.3 - 1.2 mg/dL Final  . GFR calc non Af Amer 04/14/2015 >60  >60 mL/min Final  . GFR calc Af Amer 04/14/2015 >60  >60 mL/min Final   Comment: (NOTE) The eGFR has been calculated using the CKD EPI equation. This calculation has not been validated in all clinical situations. eGFR's persistently <60 mL/min signify possible Chronic Kidney Disease.   . Anion gap 04/14/2015 10  5 - 15 Final  . CA 27.29 04/14/2015 48.0* 0.0 - 38.6 U/mL Final   Comment:  (NOTE) [employer] [facility]/ACS methodology Performed At: [facility] [place], [place] [id number] Lindon Romp MD BU:[id number]     II   ASSESSMENT: 1.  Patient had a history of ductal carcinoma in situ several years ago had lumpectomy and radiation therapy to the right breast. If edema is decrease in right upper extremity.. T4 N1 M0 cancer diagnosis on the right side on June of 2015 Patient did not want any radiation chemotherapy at that time Now has recurrent disease patient is on [facility] and letrozole.  2.  Myelosuppression We will change IBRANCE to   100 mcg  on the next cycle.  Reevaluate patient in 4 weeks.       was in agreement with this plan. She also understands that She can call clinic at any time with any questions, concerns, or complaints.    No matching staging information was found for the patient.  Forest Gleason, MD   04/16/2015 4:54 PM

## 2015-05-11 ENCOUNTER — Other Ambulatory Visit: Payer: Self-pay | Admitting: *Deleted

## 2015-05-11 ENCOUNTER — Ambulatory Visit: Payer: Medicare HMO | Admitting: Oncology

## 2015-05-11 ENCOUNTER — Inpatient Hospital Stay: Payer: Medicare HMO | Attending: Family Medicine

## 2015-05-11 DIAGNOSIS — Z923 Personal history of irradiation: Secondary | ICD-10-CM | POA: Insufficient documentation

## 2015-05-11 DIAGNOSIS — C50011 Malignant neoplasm of nipple and areola, right female breast: Secondary | ICD-10-CM

## 2015-05-11 DIAGNOSIS — Z9011 Acquired absence of right breast and nipple: Secondary | ICD-10-CM | POA: Insufficient documentation

## 2015-05-11 DIAGNOSIS — C773 Secondary and unspecified malignant neoplasm of axilla and upper limb lymph nodes: Secondary | ICD-10-CM | POA: Diagnosis not present

## 2015-05-11 DIAGNOSIS — Z79811 Long term (current) use of aromatase inhibitors: Secondary | ICD-10-CM | POA: Diagnosis not present

## 2015-05-11 DIAGNOSIS — Z170 Estrogen receptor positive status [ER+]: Secondary | ICD-10-CM | POA: Diagnosis not present

## 2015-05-11 DIAGNOSIS — Z86000 Personal history of in-situ neoplasm of breast: Secondary | ICD-10-CM | POA: Diagnosis not present

## 2015-05-11 LAB — COMPREHENSIVE METABOLIC PANEL
ALT: 22 U/L (ref 14–54)
ANION GAP: 7 (ref 5–15)
AST: 20 U/L (ref 15–41)
Albumin: 3.8 g/dL (ref 3.5–5.0)
Alkaline Phosphatase: 50 U/L (ref 38–126)
BILIRUBIN TOTAL: 0.8 mg/dL (ref 0.3–1.2)
BUN: 18 mg/dL (ref 6–20)
CALCIUM: 8.5 mg/dL — AB (ref 8.9–10.3)
CO2: 25 mmol/L (ref 22–32)
Chloride: 99 mmol/L — ABNORMAL LOW (ref 101–111)
Creatinine, Ser: 0.77 mg/dL (ref 0.44–1.00)
Glucose, Bld: 96 mg/dL (ref 65–99)
POTASSIUM: 4.4 mmol/L (ref 3.5–5.1)
Sodium: 131 mmol/L — ABNORMAL LOW (ref 135–145)
TOTAL PROTEIN: 6.7 g/dL (ref 6.5–8.1)

## 2015-05-11 LAB — CBC WITH DIFFERENTIAL/PLATELET
BASOS ABS: 0 10*3/uL (ref 0–0.1)
Basophils Relative: 1 %
Eosinophils Absolute: 0.1 10*3/uL (ref 0–0.7)
Eosinophils Relative: 5 %
HEMATOCRIT: 35.2 % (ref 35.0–47.0)
Hemoglobin: 12.4 g/dL (ref 12.0–16.0)
LYMPHS PCT: 12 %
Lymphs Abs: 0.3 10*3/uL — ABNORMAL LOW (ref 1.0–3.6)
MCH: 36.5 pg — ABNORMAL HIGH (ref 26.0–34.0)
MCHC: 35.2 g/dL (ref 32.0–36.0)
MCV: 103.5 fL — AB (ref 80.0–100.0)
Monocytes Absolute: 0.2 10*3/uL (ref 0.2–0.9)
Monocytes Relative: 9 %
NEUTROS ABS: 1.9 10*3/uL (ref 1.4–6.5)
NEUTROS PCT: 73 %
Platelets: 148 10*3/uL — ABNORMAL LOW (ref 150–440)
RBC: 3.4 MIL/uL — ABNORMAL LOW (ref 3.80–5.20)
RDW: 19.5 % — AB (ref 11.5–14.5)
WBC: 2.6 10*3/uL — AB (ref 3.6–11.0)

## 2015-05-12 ENCOUNTER — Other Ambulatory Visit: Payer: Medicare HMO

## 2015-05-12 ENCOUNTER — Ambulatory Visit: Payer: Medicare HMO | Admitting: Oncology

## 2015-05-12 LAB — CANCER ANTIGEN 27.29: CA 27.29: 55.4 U/mL — ABNORMAL HIGH (ref 0.0–38.6)

## 2015-05-16 ENCOUNTER — Telehealth: Payer: Self-pay | Admitting: *Deleted

## 2015-05-16 NOTE — Telephone Encounter (Signed)
Had message from answering service, but unable to reach pt by phone when call returned

## 2015-06-20 ENCOUNTER — Other Ambulatory Visit: Payer: Self-pay | Admitting: *Deleted

## 2015-06-20 ENCOUNTER — Inpatient Hospital Stay: Payer: Medicare HMO

## 2015-06-20 ENCOUNTER — Encounter: Payer: Self-pay | Admitting: Family Medicine

## 2015-06-20 ENCOUNTER — Inpatient Hospital Stay: Payer: Medicare HMO | Attending: Family Medicine | Admitting: Family Medicine

## 2015-06-20 VITALS — BP 148/73 | HR 88 | Temp 97.5°F | Wt 114.6 lb

## 2015-06-20 DIAGNOSIS — I10 Essential (primary) hypertension: Secondary | ICD-10-CM | POA: Diagnosis not present

## 2015-06-20 DIAGNOSIS — R5381 Other malaise: Secondary | ICD-10-CM | POA: Insufficient documentation

## 2015-06-20 DIAGNOSIS — C773 Secondary and unspecified malignant neoplasm of axilla and upper limb lymph nodes: Secondary | ICD-10-CM | POA: Insufficient documentation

## 2015-06-20 DIAGNOSIS — R0981 Nasal congestion: Secondary | ICD-10-CM | POA: Diagnosis not present

## 2015-06-20 DIAGNOSIS — Z9011 Acquired absence of right breast and nipple: Secondary | ICD-10-CM | POA: Diagnosis not present

## 2015-06-20 DIAGNOSIS — Z170 Estrogen receptor positive status [ER+]: Secondary | ICD-10-CM | POA: Diagnosis not present

## 2015-06-20 DIAGNOSIS — Z923 Personal history of irradiation: Secondary | ICD-10-CM | POA: Insufficient documentation

## 2015-06-20 DIAGNOSIS — Z79811 Long term (current) use of aromatase inhibitors: Secondary | ICD-10-CM

## 2015-06-20 DIAGNOSIS — M549 Dorsalgia, unspecified: Secondary | ICD-10-CM | POA: Insufficient documentation

## 2015-06-20 DIAGNOSIS — Z79899 Other long term (current) drug therapy: Secondary | ICD-10-CM | POA: Diagnosis not present

## 2015-06-20 DIAGNOSIS — Z853 Personal history of malignant neoplasm of breast: Secondary | ICD-10-CM

## 2015-06-20 DIAGNOSIS — C50011 Malignant neoplasm of nipple and areola, right female breast: Secondary | ICD-10-CM

## 2015-06-20 DIAGNOSIS — R590 Localized enlarged lymph nodes: Secondary | ICD-10-CM | POA: Insufficient documentation

## 2015-06-20 DIAGNOSIS — M25511 Pain in right shoulder: Secondary | ICD-10-CM | POA: Diagnosis not present

## 2015-06-20 DIAGNOSIS — R091 Pleurisy: Secondary | ICD-10-CM

## 2015-06-20 DIAGNOSIS — Z86000 Personal history of in-situ neoplasm of breast: Secondary | ICD-10-CM | POA: Diagnosis not present

## 2015-06-20 DIAGNOSIS — R5383 Other fatigue: Secondary | ICD-10-CM | POA: Diagnosis not present

## 2015-06-20 DIAGNOSIS — R05 Cough: Secondary | ICD-10-CM | POA: Diagnosis not present

## 2015-06-20 LAB — COMPREHENSIVE METABOLIC PANEL
ALK PHOS: 46 U/L (ref 38–126)
ALT: 23 U/L (ref 14–54)
AST: 23 U/L (ref 15–41)
Albumin: 4.1 g/dL (ref 3.5–5.0)
Anion gap: 3 — ABNORMAL LOW (ref 5–15)
BUN: 16 mg/dL (ref 6–20)
CO2: 26 mmol/L (ref 22–32)
CREATININE: 0.89 mg/dL (ref 0.44–1.00)
Calcium: 8.6 mg/dL — ABNORMAL LOW (ref 8.9–10.3)
Chloride: 102 mmol/L (ref 101–111)
GFR calc non Af Amer: >60 mL/min (ref >60)
GLUCOSE: 87 mg/dL (ref 65–99)
Potassium: 4 mmol/L (ref 3.5–5.1)
SODIUM: 131 mmol/L — AB (ref 135–145)
Total Bilirubin: 0.9 mg/dL (ref 0.3–1.2)
Total Protein: 6.9 g/dL (ref 6.5–8.1)

## 2015-06-20 LAB — CBC WITH DIFFERENTIAL/PLATELET
BASOS ABS: 0.1 10*3/uL (ref 0–0.1)
Basophils Relative: 1 %
Eosinophils Absolute: 0.1 10*3/uL (ref 0–0.7)
Eosinophils Relative: 2 %
HCT: 38.2 % (ref 35.0–47.0)
HEMOGLOBIN: 13.1 g/dL (ref 12.0–16.0)
LYMPHS ABS: 0.5 10*3/uL — AB (ref 1.0–3.6)
LYMPHS PCT: 15 %
MCH: 36.6 pg — AB (ref 26.0–34.0)
MCHC: 34.4 g/dL (ref 32.0–36.0)
MCV: 106.3 fL — AB (ref 80.0–100.0)
Monocytes Absolute: 0.3 10*3/uL (ref 0.2–0.9)
Monocytes Relative: 9 %
NEUTROS PCT: 73 %
Neutro Abs: 2.8 10*3/uL (ref 1.4–6.5)
Platelets: 178 10*3/uL (ref 150–440)
RBC: 3.59 MIL/uL — AB (ref 3.80–5.20)
RDW: 14.9 % — ABNORMAL HIGH (ref 11.5–14.5)
WBC: 3.8 10*3/uL (ref 3.6–11.0)

## 2015-06-20 NOTE — Progress Notes (Signed)
[place]  [phone]) [phone]  Fax:(336) [phone]     Hayley [name] DOB: 1933/07/26  MR#: [id number]  NOI#:[id number]  Patient Care Team: Lenard Simmer, MD as PCP - General (Endocrinology)  CHIEF COMPLAINT:  Chief Complaint  Patient presents with  . Malignant neoplasm of areola of right breast in female [facility])    INTERVAL HISTORY:  Patient is here for continued follow-up and treatment consideration regarding carcinoma of breast. Patient was diagnosed with progressive breast cancer in November 2016 and at that time started on Ibrance and letrozole. Patient has had her dose of Ibrance decreased to 100 mg daily approximately 2 cycles ago. She started in her most recent 3 week on and 1 week off cycle on Tuesday, April 11. She overall reports feeling very well except for new pain in her upper to mid thoracic spine as well as her right scapula. She also has some right supraclavicular lymph node enlargement. Otherwise patient reports feeling very well after recent bouts with bacterial lung infection and several cycles of antibiotics as well as IM steroids.  REVIEW OF SYSTEMS:   Review of Systems  Constitutional: Positive for malaise/fatigue. Negative for fever, chills, weight loss and diaphoresis.  HENT: Positive for congestion.   Eyes: Negative.   Respiratory: Positive for cough. Negative for hemoptysis, sputum production, shortness of breath and wheezing.   Cardiovascular: Negative for chest pain, palpitations, orthopnea, claudication, leg swelling and PND.  Gastrointestinal: Negative for heartburn, nausea, vomiting, abdominal pain, diarrhea, constipation, blood in stool and melena.  Genitourinary: Negative.   Musculoskeletal: Positive for back pain.       Right shoulder pain, mid thoracic spine   Skin: Negative.   Neurological: Negative for dizziness, tingling, focal weakness, seizures and weakness.  Endo/Heme/Allergies: Does not bruise/bleed easily.    Psychiatric/Behavioral: Negative for depression. The patient is not nervous/anxious and does not have insomnia.     As per HPI. Otherwise, a complete review of systems is negatve.  ONCOLOGY HISTORY: Oncology History      1. Patient had carcinoma of breast (right side) patient was operated at [facility] had radiation therapy. 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 Recent abnormaliity of the nipple:exact duration not knownbiopsy was positive for invasive carcinoma Estrogen receptor positive. Progesterone receptor positive. HER-2 receptor negative status post mastectomy Skin involvement T4 B. N0 M0 tumor (June 2015) Patient refused any radiation chemotherapy or any other anti-hormonal therapy. 3. Biopsy of right axillary lymph node is consistent with mammary carcinoma (November, 2016) Estrogen and progesterone receptor pending 4. Patient has been started on [facility] and letrozole from November of 2016          Cancer of breast [facility])   01/20/2015 Initial Diagnosis Cancer of breast [facility])    PAST MEDICAL HISTORY: Past Medical History  Diagnosis Date  . Breast cancer ([place]) 17+ years ago    Right Breast c radiation -- 2015 Mastectomy  . Hypertension   . Cancer of breast ([place]) 01/20/2015    PAST SURGICAL HISTORY: Past Surgical History  Procedure Laterality Date  . Mastectomy Right 2015    After screening in May 2015 - Pt choice  . Abdominal hysterectomy    . Breast lumpectomy Right 1998    c Radiation  . Joint replacement Right 2008    knee    FAMILY HISTORY No family history on file.  GYNECOLOGIC HISTORY:  No LMP recorded. Patient has had a hysterectomy.     ADVANCED DIRECTIVES:  HEALTH MAINTENANCE: Social History  Substance Use Topics  . Smoking status: Never Smoker   . Smokeless tobacco: Not on file  . Alcohol Use: No     Colonoscopy:  PAP:  Bone density:  Mammogram:  No Known Allergies  Current Outpatient Prescriptions   Medication Sig Dispense Refill  . chlorpheniramine-HYDROcodone (TUSSIONEX) 10-8 MG/5ML SUER TAKE 1 TEASPOON BY MOUTH EVERY 12 HOURS  0  . letrozole (FEMARA) 2.5 MG tablet Take 1 tablet (2.5 mg total) by mouth daily. 90 tablet 3  . lisinopril (PRINIVIL,ZESTRIL) 5 MG tablet Take by mouth.    . naproxen (NAPROSYN) 250 MG tablet Take 250 mg by mouth 2 (two) times daily with a meal. As needed    . palbociclib (IBRANCE) 100 MG capsule Take 1 capsule (100 mg total) by mouth daily with breakfast. Take whole with food. Take for 3 weeks, then 1 week off. 21 capsule 3   No current facility-administered medications for this visit.    OBJECTIVE: BP 148/73 mmHg  Pulse 88  Temp(Src) 97.5 F (36.4 C) (Tympanic)  Wt 114 lb 10.2 oz (52 kg)   Body mass index is 19.08 kg/(m^2).    ECOG FS:1 - Symptomatic but completely ambulatory  General: Well-developed, well-nourished, no acute distress. Eyes: Pink conjunctiva, anicteric sclera. HEENT: Normocephalic, moist mucous membranes, clear oropharnyx. Lungs: Clear to auscultation bilaterally. Heart: Regular rate and rhythm. No rubs, murmurs, or gallops. Abdomen: Soft, nontender, nondistended. No organomegaly noted, normoactive bowel sounds. Breast: Right chest wall free of masses. Musculoskeletal: No edema, cyanosis, or clubbing. Lymphedema sleeve in place on right arm and Neuro: Alert, answering all questions appropriately. Cranial nerves grossly intact. Skin: No rashes or petechiae noted. Psych: Normal affect. Lymphatics: Patient with right supraclavicular lymph node enlargement.   LAB RESULTS:  Appointment on 06/20/2015  Component Date Value Ref Range Status  . WBC 06/20/2015 3.8  3.6 - 11.0 K/uL Final  . RBC 06/20/2015 3.59* 3.80 - 5.20 MIL/uL Final  . Hemoglobin 06/20/2015 13.1  12.0 - 16.0 g/dL Final  . HCT 06/20/2015 38.2  35.0 - 47.0 % Final  . MCV 06/20/2015 106.3* 80.0 - 100.0 fL Final  . MCH 06/20/2015 36.6* 26.0 - 34.0 pg Final  . MCHC  06/20/2015 34.4  32.0 - 36.0 g/dL Final  . RDW 06/20/2015 14.9* 11.5 - 14.5 % Final  . Platelets 06/20/2015 178  150 - 440 K/uL Final  . Neutrophils Relative % 06/20/2015 73   Final  . Neutro Abs 06/20/2015 2.8  1.4 - 6.5 K/uL Final  . Lymphocytes Relative 06/20/2015 15   Final  . Lymphs Abs 06/20/2015 0.5* 1.0 - 3.6 K/uL Final  . Monocytes Relative 06/20/2015 9   Final  . Monocytes Absolute 06/20/2015 0.3  0.2 - 0.9 K/uL Final  . Eosinophils Relative 06/20/2015 2   Final  . Eosinophils Absolute 06/20/2015 0.1  0 - 0.7 K/uL Final  . Basophils Relative 06/20/2015 1   Final  . Basophils Absolute 06/20/2015 0.1  0 - 0.1 K/uL Final  . Sodium 06/20/2015 131* 135 - 145 mmol/L Final  . Potassium 06/20/2015 4.0  3.5 - 5.1 mmol/L Final  . Chloride 06/20/2015 102  101 - 111 mmol/L Final  . CO2 06/20/2015 26  22 - 32 mmol/L Final  . Glucose, Bld 06/20/2015 87  65 - 99 mg/dL Final  . BUN 06/20/2015 16  6 - 20 mg/dL Final  . Creatinine, Ser 06/20/2015 0.89  0.44 - 1.00 mg/dL Final  . Calcium 06/20/2015 8.6*  8.9 - 10.3 mg/dL Final  . Total Protein 06/20/2015 6.9  6.5 - 8.1 g/dL Final  . Albumin 06/20/2015 4.1  3.5 - 5.0 g/dL Final  . AST 06/20/2015 23  15 - 41 U/L Final  . ALT 06/20/2015 23  14 - 54 U/L Final  . Alkaline Phosphatase 06/20/2015 46  38 - 126 U/L Final  . Total Bilirubin 06/20/2015 0.9  0.3 - 1.2 mg/dL Final  . GFR calc non Af Amer 06/20/2015 >60  >60 mL/min Final  . GFR calc Af Amer 06/20/2015 >60  >60 mL/min Final   Comment: (NOTE) The eGFR has been calculated using the CKD EPI equation. This calculation has not been validated in all clinical situations. eGFR's persistently <60 mL/min signify possible Chronic Kidney Disease.   . Anion gap 06/20/2015 3* 5 - 15 Final    STUDIES: No results found.  ASSESSMENT:  Carcinoma of right breast, stage IV, T4 N1 M1.  PLAN:   1. Right breast cancer. Patient with a history of ductal carcinoma in situ several years ago at [facility]  followed by radiation therapy. This was categorized as a carcinoma in situ stage 0. Patient then had an abnormality of the right nipple that was positive for invasive carcinoma, ER/PR positive HER-2/neu negative, she is status post mastectomy for a T4b N0 M0 tumor from June 2015. Patient then refused any radiation therapy, chemotherapy, or anti-hormonal therapy. Subsequent recurrent disease was diagnosed in November 2016 with biopsy of right axillary lymph node. Patient is currently on Ibrance 100 mg and letrozole. She is tolerating treatment well. She does however have new right supraclavicular lymphadenopathy as well as pain in the spinal and right scapular area. Patient also with mildly elevated tumor markers of 55. We'll proceed with restaging with PET scan if insurance approval. If not authorized we'll have to proceed with CT scan of chest abdomen and pelvis. Patient will see Dr. Oliva Bustard following reimaging for further discussion regarding treatment options for continuation of therapy.  Patient expressed understanding and was in agreement with this plan. She also understands that She can call clinic at any time with any questions, concerns, or complaints.   Dr. Grayland Ormond was available for consultation and review of plan of care for this patient.  Cancer of breast [facility])   Staging form: Breast, AJCC 7th Edition     Clinical: Stage IV (T4, N1, M1) - Signed by Forest Gleason, MD on 01/20/2015   Evlyn Kanner, NP   06/20/2015 11:06 AM

## 2015-06-23 ENCOUNTER — Telehealth: Payer: Self-pay | Admitting: *Deleted

## 2015-06-23 NOTE — Telephone Encounter (Signed)
Patient seen by Magda Paganini, NP on Monday.  PET scan ordered.  Patient got notification from insurance company that it will not cover the cost.  Patient is wanting to speak to someone regarding this.  Message relayed to [place] in insurance, who has been working on this today for patient.

## 2015-06-27 ENCOUNTER — Ambulatory Visit
Admission: RE | Admit: 2015-06-27 | Discharge: 2015-06-27 | Disposition: A | Discharge status: Home / Self Care | Payer: Medicare HMO | Source: Ambulatory Visit | Attending: Family Medicine | Admitting: Family Medicine

## 2015-06-27 DIAGNOSIS — Z853 Personal history of malignant neoplasm of breast: Secondary | ICD-10-CM | POA: Insufficient documentation

## 2015-06-27 DIAGNOSIS — I700 Atherosclerosis of aorta: Secondary | ICD-10-CM | POA: Diagnosis not present

## 2015-06-27 DIAGNOSIS — R978 Other abnormal tumor markers: Secondary | ICD-10-CM | POA: Insufficient documentation

## 2015-06-27 DIAGNOSIS — C773 Secondary and unspecified malignant neoplasm of axilla and upper limb lymph nodes: Secondary | ICD-10-CM | POA: Diagnosis not present

## 2015-06-27 DIAGNOSIS — Z9011 Acquired absence of right breast and nipple: Secondary | ICD-10-CM | POA: Diagnosis not present

## 2015-06-27 DIAGNOSIS — C7800 Secondary malignant neoplasm of unspecified lung: Secondary | ICD-10-CM | POA: Insufficient documentation

## 2015-06-27 DIAGNOSIS — R590 Localized enlarged lymph nodes: Secondary | ICD-10-CM | POA: Insufficient documentation

## 2015-06-27 LAB — GLUCOSE, CAPILLARY: GLUCOSE-CAPILLARY: 71 mg/dL (ref 65–99)

## 2015-06-27 MED ORDER — FLUDEOXYGLUCOSE F - 18 (FDG) INJECTION
12.6900 | Freq: Once | INTRAVENOUS | Status: AC | PRN
  Administered 2015-06-27: 12.69 via INTRAVENOUS

## 2015-07-06 ENCOUNTER — Inpatient Hospital Stay (HOSPITAL_BASED_OUTPATIENT_CLINIC_OR_DEPARTMENT_OTHER): Payer: Medicare HMO | Admitting: Oncology

## 2015-07-06 ENCOUNTER — Inpatient Hospital Stay: Payer: Medicare HMO | Attending: Oncology

## 2015-07-06 ENCOUNTER — Encounter: Payer: Self-pay | Admitting: Oncology

## 2015-07-06 VITALS — BP 137/79 | HR 82 | Temp 97.5°F | Resp 18 | Wt 111.2 lb

## 2015-07-06 DIAGNOSIS — C773 Secondary and unspecified malignant neoplasm of axilla and upper limb lymph nodes: Secondary | ICD-10-CM | POA: Insufficient documentation

## 2015-07-06 DIAGNOSIS — I10 Essential (primary) hypertension: Secondary | ICD-10-CM | POA: Diagnosis not present

## 2015-07-06 DIAGNOSIS — Z96651 Presence of right artificial knee joint: Secondary | ICD-10-CM | POA: Diagnosis not present

## 2015-07-06 DIAGNOSIS — D759 Disease of blood and blood-forming organs, unspecified: Secondary | ICD-10-CM

## 2015-07-06 DIAGNOSIS — Z9011 Acquired absence of right breast and nipple: Secondary | ICD-10-CM | POA: Diagnosis not present

## 2015-07-06 DIAGNOSIS — Z853 Personal history of malignant neoplasm of breast: Secondary | ICD-10-CM

## 2015-07-06 DIAGNOSIS — R590 Localized enlarged lymph nodes: Secondary | ICD-10-CM | POA: Insufficient documentation

## 2015-07-06 DIAGNOSIS — Z923 Personal history of irradiation: Secondary | ICD-10-CM | POA: Diagnosis not present

## 2015-07-06 DIAGNOSIS — I972 Postmastectomy lymphedema syndrome: Secondary | ICD-10-CM | POA: Insufficient documentation

## 2015-07-06 DIAGNOSIS — Z79899 Other long term (current) drug therapy: Secondary | ICD-10-CM | POA: Insufficient documentation

## 2015-07-06 DIAGNOSIS — Z9223 Personal history of estrogen therapy: Secondary | ICD-10-CM

## 2015-07-06 DIAGNOSIS — Z87891 Personal history of nicotine dependence: Secondary | ICD-10-CM | POA: Insufficient documentation

## 2015-07-06 DIAGNOSIS — Z170 Estrogen receptor positive status [ER+]: Secondary | ICD-10-CM | POA: Diagnosis not present

## 2015-07-06 DIAGNOSIS — C50011 Malignant neoplasm of nipple and areola, right female breast: Secondary | ICD-10-CM | POA: Diagnosis not present

## 2015-07-06 DIAGNOSIS — Z79811 Long term (current) use of aromatase inhibitors: Secondary | ICD-10-CM

## 2015-07-06 DIAGNOSIS — Z86000 Personal history of in-situ neoplasm of breast: Secondary | ICD-10-CM

## 2015-07-06 DIAGNOSIS — Z90710 Acquired absence of both cervix and uterus: Secondary | ICD-10-CM | POA: Diagnosis not present

## 2015-07-06 LAB — COMPREHENSIVE METABOLIC PANEL
ALBUMIN: 4.4 g/dL (ref 3.5–5.0)
ALK PHOS: 40 U/L (ref 38–126)
ALT: 23 U/L (ref 14–54)
ANION GAP: 8 (ref 5–15)
AST: 24 U/L (ref 15–41)
BUN: 17 mg/dL (ref 6–20)
CHLORIDE: 101 mmol/L (ref 101–111)
CO2: 28 mmol/L (ref 22–32)
Calcium: 9.5 mg/dL (ref 8.9–10.3)
Creatinine, Ser: 0.66 mg/dL (ref 0.44–1.00)
Glucose, Bld: 102 mg/dL — ABNORMAL HIGH (ref 65–99)
POTASSIUM: 4.5 mmol/L (ref 3.5–5.1)
SODIUM: 137 mmol/L (ref 135–145)
Total Bilirubin: 0.8 mg/dL (ref 0.3–1.2)
Total Protein: 7.3 g/dL (ref 6.5–8.1)

## 2015-07-06 LAB — CBC WITH DIFFERENTIAL/PLATELET
BASOS PCT: 3 %
Basophils Absolute: 0.1 10*3/uL (ref 0–0.1)
EOS ABS: 0.1 10*3/uL (ref 0–0.7)
Eosinophils Relative: 2 %
HCT: 37 % (ref 35.0–47.0)
HEMOGLOBIN: 12.9 g/dL (ref 12.0–16.0)
Lymphocytes Relative: 16 %
Lymphs Abs: 0.4 10*3/uL — ABNORMAL LOW (ref 1.0–3.6)
MCH: 36.8 pg — ABNORMAL HIGH (ref 26.0–34.0)
MCHC: 34.9 g/dL (ref 32.0–36.0)
MCV: 105.4 fL — ABNORMAL HIGH (ref 80.0–100.0)
Monocytes Absolute: 0.3 10*3/uL (ref 0.2–0.9)
Monocytes Relative: 11 %
NEUTROS PCT: 68 %
Neutro Abs: 1.8 10*3/uL (ref 1.4–6.5)
PLATELETS: 128 10*3/uL — AB (ref 150–440)
RBC: 3.51 MIL/uL — AB (ref 3.80–5.20)
RDW: 15.1 % — ABNORMAL HIGH (ref 11.5–14.5)
WBC: 2.7 10*3/uL — AB (ref 3.6–11.0)

## 2015-07-07 LAB — CANCER ANTIGEN 27.29: CA 27.29: 85.2 U/mL — ABNORMAL HIGH (ref 0.0–38.6)

## 2015-07-11 ENCOUNTER — Encounter: Payer: Self-pay | Admitting: Oncology

## 2015-07-11 NOTE — Progress Notes (Signed)
[place] @ [facility] [phone]  Fax:(336) [place]: 06/12/1933  MR#: [id number]  EVO#:[id number]  Patient Care Team: Lenard Simmer, MD as PCP - General (Endocrinology)  CHIEF COMPLAINT:  Chief Complaint  Patient presents with  . Breast Cancer  1.patient had carcinoma of breast (right side) patient was operated at [facility] had radiation therapy.  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 Recent abnormaliity of  the nipple:exact duration not knownbiopsy was positive for invasive carcinoma Estrogen receptor positive.  Progesterone receptor positive.  HER-2 receptor negative status post mastectomy Skin involvement T4 B. N0 M0 tumor (June 2015) Patient refused any radiation chemotherapy or any other anti-hormonal therapy.  3.  Biopsy of right axillary lymph node is consistent with mammary carcinoma (November, 2016) Estrogen and progesterone receptor pending 4.  Patient has been started on [facility] and letrozole from November of 2016    INTERVAL HISTORY:  82 year old lady came today further follow-up.  Tolerating IBRANCE very well.  Right upper extremity lymphedema is improving. Patient is here for ongoing evaluation and treatment consideration.  Chills.  No fever.  Swelling in the left upper extremity is improved. No bony pains.  Tolerating letrozole and   ibrance   She had a repeat PET scan.  Which has been evaluated independently. REVIEW OF SYSTEMS:   GENERAL:  Feels good.  Active.  No fevers, sweats or weight loss. As mentioned in history of present illness patient had swelling of the right upper extremity PERFORMANCE STATUS (ECOG):  01 HEENT:  No visual changes, runny nose, sore throat, mouth sores or tenderness. Lungs: No shortness of breath or cough.  No hemoptysis. Cardiac:  No chest pain, palpitations, orthopnea, or PND. GI:  No nausea, vomiting, diarrhea, constipation, melena or hematochezia. GU:  No urgency,  frequency, dysuria, or hematuria. Musculoskeletal:  No back pain.  No joint pain.  No muscle tenderness. Extremities:  Right upper extremity swelling has improved Skin:  No rashes or skin changes. Neuro:  No headache, numbness or weakness, balance or coordination issues. Endocrine:  No diabetes, thyroid issues, hot flashes or night sweats. Psych:  No mood changes, depression or anxiety. Pain:  No focal pain. Review of systems:  All other systems reviewed and found to be negative. As per HPI. Otherwise, a complete review of systems is negatve.  PAST MEDICAL HISTORY: Past Medical History  Diagnosis Date  . Breast cancer ([place]) 17+ years ago    Right Breast c radiation -- 2015 Mastectomy  . Hypertension   . Cancer of breast ([place]) 01/20/2015    PAST SURGICAL HISTORY: Past Surgical History  Procedure Laterality Date  . Mastectomy Right 2015    After screening in May 2015 - Pt choice  . Abdominal hysterectomy    . Breast lumpectomy Right 1998    c Radiation  . Joint replacement Right 2008    knee    Significant History/PMH:   hysterctomy:    arthroscopy-right knee:    chicken pox:    breast cancer:    right total knee replacement: 25-Sep-2007   lumpectomy:   Preventive Screening:  Has patient had any of the following test? Mammography (1)   Last Mammography: May 2014(1)   Smoking History: Smoking History quit 50 years ago(1)  PFSH: Family History: noncontributory  Comments: No  family history of any malignancy  Social History: negative alcohol, negative tobacco  Additional Past Medical and Surgical History: As mentioned above  ADVANCED DIRECTIVES:  Patient does have advance healthcare directive, Patient   does not desire to make any changes HEALTH MAINTENANCE: Social History  Substance Use Topics  . Smoking status: Never Smoker   . Smokeless tobacco: None  . Alcohol Use: No        OBJECTIVE:  Filed Vitals:   07/06/15 0940 07/06/15 0941  BP:  137/79   Pulse: 82   Temp: 97.5 F (36.4 C)   Resp:  18     Body mass index is 18.51 kg/(m^2).    ECOG FS:1 - Symptomatic but completely ambulatory  PHYSICAL EXAM: GENERAL:  Well developed, well nourished, sitting comfortably in the exam room in no acute distress. MENTAL STATUS:  Alert and oriented to person, place and time.  ENT:  Oropharynx clear without lesion.  Tongue normal. Mucous membranes moist.  RESPIRATORY:  Clear to auscultation without rales, wheezes or rhonchi. CARDIOVASCULAR:  Regular rate and rhythm without murmur, rub or gallop. BREAST:  Right chest wall area there is no evidence of recurrent disease. ABDOMEN:  Soft, non-tender, with active bowel sounds, and no hepatosplenomegaly.  No masses. BACK:  No CVA tenderness.  No tenderness on percussion of the back or rib cage. SKIN:  No rashes, ulcers or lesions. EXTREMITIES: Right upper extremity lymphedema LYMPH NODES there are small palpable lymph node in the right supraclavicular area and the right side of the neck  NEUROLOGICAL: Unremarkable. PSYCH:  Appropriate.   LAB RESULTS:  CBC Latest Ref Rng 07/06/2015 06/20/2015  WBC 3.6 - 11.0 K/uL 2.7(L) 3.8  Hemoglobin 12.0 - 16.0 g/dL 12.9 13.1  Hematocrit 35.0 - 47.0 % 37.0 38.2  Platelets 150 - 440 K/uL 128(L) 178    Appointment on 07/06/2015  Component Date Value Ref Range Status  . WBC 07/06/2015 2.7* 3.6 - 11.0 K/uL Final  . RBC 07/06/2015 3.51* 3.80 - 5.20 MIL/uL Final  . Hemoglobin 07/06/2015 12.9  12.0 - 16.0 g/dL Final  . HCT 07/06/2015 37.0  35.0 - 47.0 % Final  . MCV 07/06/2015 105.4* 80.0 - 100.0 fL Final  . MCH 07/06/2015 36.8* 26.0 - 34.0 pg Final  . MCHC 07/06/2015 34.9  32.0 - 36.0 g/dL Final  . RDW 07/06/2015 15.1* 11.5 - 14.5 % Final  . Platelets 07/06/2015 128* 150 - 440 K/uL Final  . Neutrophils Relative % 07/06/2015 68   Final  . Neutro Abs 07/06/2015 1.8  1.4 - 6.5 K/uL Final  . Lymphocytes Relative 07/06/2015 16   Final  . Lymphs Abs  07/06/2015 0.4* 1.0 - 3.6 K/uL Final  . Monocytes Relative 07/06/2015 11   Final  . Monocytes Absolute 07/06/2015 0.3  0.2 - 0.9 K/uL Final  . Eosinophils Relative 07/06/2015 2   Final  . Eosinophils Absolute 07/06/2015 0.1  0 - 0.7 K/uL Final  . Basophils Relative 07/06/2015 3   Final  . Basophils Absolute 07/06/2015 0.1  0 - 0.1 K/uL Final  . Sodium 07/06/2015 137  135 - 145 mmol/L Final  . Potassium 07/06/2015 4.5  3.5 - 5.1 mmol/L Final  . Chloride 07/06/2015 101  101 - 111 mmol/L Final  . CO2 07/06/2015 28  22 - 32 mmol/L Final  . Glucose, Bld 07/06/2015 102* 65 - 99 mg/dL Final  . BUN 07/06/2015 17  6 - 20 mg/dL Final  . Creatinine, Ser 07/06/2015 0.66  0.44 - 1.00 mg/dL Final  . Calcium 07/06/2015 9.5  8.9 - 10.3 mg/dL Final  . Total Protein 07/06/2015 7.3  6.5 -  8.1 g/dL Final  . Albumin 07/06/2015 4.4  3.5 - 5.0 g/dL Final  . AST 07/06/2015 24  15 - 41 U/L Final  . ALT 07/06/2015 23  14 - 54 U/L Final  . Alkaline Phosphatase 07/06/2015 40  38 - 126 U/L Final  . Total Bilirubin 07/06/2015 0.8  0.3 - 1.2 mg/dL Final  . GFR calc non Af Amer 07/06/2015 >60  >60 mL/min Final  . GFR calc Af Amer 07/06/2015 >60  >60 mL/min Final   Comment: (NOTE) The eGFR has been calculated using the CKD EPI equation. This calculation has not been validated in all clinical situations. eGFR's persistently <60 mL/min signify possible Chronic Kidney Disease.   . Anion gap 07/06/2015 8  5 - 15 Final  . CA 27.29 07/06/2015 85.2* 0.0 - 38.6 U/mL Final   Comment: (NOTE) [employer]/ACS methodology Performed At: [facility] [place], [place] [id number] Lindon Romp MD [id number]     II   ASSESSMENT: 1.  Patient had a history of ductal carcinoma in situ several years ago had lumpectomy and radiation therapy to the right breast. If edema is decrease in right upper extremity.. T4 N1 M0 cancer diagnosis on the right side on June of 2015 Patient did not want any  radiation chemotherapy at that time Now has recurrent disease patient is on [facility] and letrozole.  2.  Myelosuppression We will change IBRANCE to   100 mcg  on the next cycle.  Reevaluate patient in 4 weeks.  Scan has been evaluated independently shows marked progression of pulmonary nodules. ca2 7.29 is rising. We will consider possibility of changing treatment and I will discuss that in detail with the patient.  One option includes possibility of Fazio Lidex and IBTotal duration of visit was 5 minutes.  50% or more time was spent in counseling patient and family regarding prognosis and options of treatment and available resourcesRANCE or changing to Aromasin.       was in agreement with this plan. She also understands that She can call clinic at any time with any questions, concerns, or complaints.    No matching staging information was found for the patient.  Forest Gleason, MD   07/11/2015 11:14 AM

## 2015-07-27 ENCOUNTER — Ambulatory Visit: Payer: Medicare HMO

## 2015-07-27 ENCOUNTER — Encounter: Payer: Self-pay | Admitting: Oncology

## 2015-07-27 ENCOUNTER — Inpatient Hospital Stay (HOSPITAL_BASED_OUTPATIENT_CLINIC_OR_DEPARTMENT_OTHER): Payer: Medicare HMO | Admitting: Oncology

## 2015-07-27 VITALS — BP 175/70 | HR 90 | Temp 98.0°F | Resp 18 | Wt 113.5 lb

## 2015-07-27 DIAGNOSIS — Z9011 Acquired absence of right breast and nipple: Secondary | ICD-10-CM

## 2015-07-27 DIAGNOSIS — C50011 Malignant neoplasm of nipple and areola, right female breast: Secondary | ICD-10-CM

## 2015-07-27 DIAGNOSIS — R590 Localized enlarged lymph nodes: Secondary | ICD-10-CM

## 2015-07-27 DIAGNOSIS — Z9223 Personal history of estrogen therapy: Secondary | ICD-10-CM

## 2015-07-27 DIAGNOSIS — Z79899 Other long term (current) drug therapy: Secondary | ICD-10-CM

## 2015-07-27 DIAGNOSIS — C773 Secondary and unspecified malignant neoplasm of axilla and upper limb lymph nodes: Secondary | ICD-10-CM | POA: Diagnosis not present

## 2015-07-27 DIAGNOSIS — Z923 Personal history of irradiation: Secondary | ICD-10-CM

## 2015-07-27 DIAGNOSIS — C50919 Malignant neoplasm of unspecified site of unspecified female breast: Secondary | ICD-10-CM

## 2015-07-27 DIAGNOSIS — Z79811 Long term (current) use of aromatase inhibitors: Secondary | ICD-10-CM | POA: Diagnosis not present

## 2015-07-27 DIAGNOSIS — R591 Generalized enlarged lymph nodes: Secondary | ICD-10-CM

## 2015-07-27 DIAGNOSIS — I972 Postmastectomy lymphedema syndrome: Secondary | ICD-10-CM

## 2015-07-27 DIAGNOSIS — Z87891 Personal history of nicotine dependence: Secondary | ICD-10-CM

## 2015-07-27 DIAGNOSIS — Z170 Estrogen receptor positive status [ER+]: Secondary | ICD-10-CM | POA: Diagnosis not present

## 2015-07-27 DIAGNOSIS — I890 Lymphedema, not elsewhere classified: Secondary | ICD-10-CM

## 2015-07-27 DIAGNOSIS — Z86000 Personal history of in-situ neoplasm of breast: Secondary | ICD-10-CM

## 2015-07-27 NOTE — Progress Notes (Signed)
[place] @ [facility] Telephone:([phone]) [phone]  Fax:([phone]) [place]: May 22, 1933  MR#: [id number]  DVV#:[id number]  Patient Care Team: Lenard Simmer, MD as PCP - General (Endocrinology)  CHIEF COMPLAINT:  Chief Complaint  Patient presents with  . Breast Cancer  1.patient had carcinoma of breast (right side) patient was operated at [facility] had radiation therapy.  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 Recent abnormaliity of  the nipple:exact duration not knownbiopsy was positive for invasive carcinoma Estrogen receptor positive.  Progesterone receptor positive.  HER-2 receptor negative status post mastectomy Skin involvement T4 B. N0 M0 tumor (June 2015) Patient refused any radiation chemotherapy or any other anti-hormonal therapy.  3.  Biopsy of right axillary lymph node is consistent with mammary carcinoma (November, 2016) Estrogen and progesterone receptor pending 4.  Patient has been started on [facility] and letrozole from November of 2016. 5.She is starting Faslodex and IBRANCE from next week of May of 2017 because of progressing disease by tumor markers and PET scan    INTERVAL HISTORY:  82 year old lady came today further follow-up.  Tolerating IBRANCE very well.  Right upper extremity lymphedema is improving. Patient is here for ongoing evaluation and treatment consideration.  Chills.  No fever.  Swelling in the left upper extremity is improved. No bony pains.  Tolerating letrozole and   ibrance   She had a repeat PET scan.  Which has been evaluated independently. REVIEW OF SYSTEMS:   GENERAL:  Feels good.  Active.  No fevers, sweats or weight loss. As mentioned in history of present illness patient had swelling of the right upper extremity PERFORMANCE STATUS (ECOG):  01 HEENT:  No visual changes, runny nose, sore throat, mouth sores or tenderness. Lungs: No shortness of breath or cough.  No hemoptysis. Cardiac:  No chest  pain, palpitations, orthopnea, or PND. GI:  No nausea, vomiting, diarrhea, constipation, melena or hematochezia. GU:  No urgency, frequency, dysuria, or hematuria. Musculoskeletal:  No back pain.  No joint pain.  No muscle tenderness. Extremities:  Right upper extremity swelling has improved Skin:  No rashes or skin changes. Neuro:  No headache, numbness or weakness, balance or coordination issues. Endocrine:  No diabetes, thyroid issues, hot flashes or night sweats. Psych:  No mood changes, depression or anxiety. Pain:  No focal pain. Review of systems:  All other systems reviewed and found to be negative. As per HPI. Otherwise, a complete review of systems is negatve.  PAST MEDICAL HISTORY: Past Medical History  Diagnosis Date  . Breast cancer ([place]) 17+ years ago    Right Breast c radiation -- 2015 Mastectomy  . Hypertension   . Cancer of breast ([place]) 01/20/2015    PAST SURGICAL HISTORY: Past Surgical History  Procedure Laterality Date  . Mastectomy Right 2015    After screening in May 2015 - Pt choice  . Abdominal hysterectomy    . Breast lumpectomy Right 1998    c Radiation  . Joint replacement Right 2008    knee    Significant History/PMH:   hysterctomy:    arthroscopy-right knee:    chicken pox:    breast cancer:    right total knee replacement: 25-Sep-2007   lumpectomy:   Preventive Screening:  Has patient had any of the following test? Mammography (1)   Last Mammography: May 2014(1)   Smoking History: Smoking History quit 50 years ago(1)  PFSH: Family History: noncontributory  Comments: No  family history of any malignancy  Social History: negative alcohol, negative tobacco  Additional Past Medical and Surgical History: As mentioned above   ADVANCED DIRECTIVES:  Patient does have advance healthcare directive, Patient   does not desire to make any changes HEALTH MAINTENANCE: Social History  Substance Use Topics  . Smoking status: Never Smoker    . Smokeless tobacco: Not on file  . Alcohol Use: No        OBJECTIVE:  Filed Vitals:   07/27/15 1450  BP: 175/70  Pulse: 90  Temp: 98 F (36.7 C)  Resp: 18     Body mass index is 18.89 kg/(m^2).    ECOG FS:1 - Symptomatic but completely ambulatory  PHYSICAL EXAM: GENERAL:  Well developed, well nourished, sitting comfortably in the exam room in no acute distress. MENTAL STATUS:  Alert and oriented to person, place and time.  ENT:  Oropharynx clear without lesion.  Tongue normal. Mucous membranes moist.  RESPIRATORY:  Clear to auscultation without rales, wheezes or rhonchi. CARDIOVASCULAR:  Regular rate and rhythm without murmur, rub or gallop. BREAST:  Right chest wall area there is no evidence of recurrent disease. ABDOMEN:  Soft, non-tender, with active bowel sounds, and no hepatosplenomegaly.  No masses. BACK:  No CVA tenderness.  No tenderness on percussion of the back or rib cage. SKIN:  No rashes, ulcers or lesions. EXTREMITIES: Right upper extremity lymphedema LYMPH NODES there are small palpable lymph node in the right supraclavicular area and the right side of the neck  NEUROLOGICAL: Unremarkable. PSYCH:  Appropriate.   LAB RESULTS:  CBC Latest Ref Rng 07/06/2015 06/20/2015  WBC 3.6 - 11.0 K/uL 2.7(L) 3.8  Hemoglobin 12.0 - 16.0 g/dL 12.9 13.1  Hematocrit 35.0 - 47.0 % 37.0 38.2  Platelets 150 - 440 K/uL 128(L) 178    No visits with results within 5 Day(s) from this visit. Latest known visit with results is:  Appointment on 07/06/2015  Component Date Value Ref Range Status  . WBC 07/06/2015 2.7* 3.6 - 11.0 K/uL Final  . RBC 07/06/2015 3.51* 3.80 - 5.20 MIL/uL Final  . Hemoglobin 07/06/2015 12.9  12.0 - 16.0 g/dL Final  . HCT 07/06/2015 37.0  35.0 - 47.0 % Final  . MCV 07/06/2015 105.4* 80.0 - 100.0 fL Final  . MCH 07/06/2015 36.8* 26.0 - 34.0 pg Final  . MCHC 07/06/2015 34.9  32.0 - 36.0 g/dL Final  . RDW 07/06/2015 15.1* 11.5 - 14.5 % Final  .  Platelets 07/06/2015 128* 150 - 440 K/uL Final  . Neutrophils Relative % 07/06/2015 68   Final  . Neutro Abs 07/06/2015 1.8  1.4 - 6.5 K/uL Final  . Lymphocytes Relative 07/06/2015 16   Final  . Lymphs Abs 07/06/2015 0.4* 1.0 - 3.6 K/uL Final  . Monocytes Relative 07/06/2015 11   Final  . Monocytes Absolute 07/06/2015 0.3  0.2 - 0.9 K/uL Final  . Eosinophils Relative 07/06/2015 2   Final  . Eosinophils Absolute 07/06/2015 0.1  0 - 0.7 K/uL Final  . Basophils Relative 07/06/2015 3   Final  . Basophils Absolute 07/06/2015 0.1  0 - 0.1 K/uL Final  . Sodium 07/06/2015 137  135 - 145 mmol/L Final  . Potassium 07/06/2015 4.5  3.5 - 5.1 mmol/L Final  . Chloride 07/06/2015 101  101 - 111 mmol/L Final  . CO2 07/06/2015 28  22 - 32 mmol/L Final  . Glucose, Bld 07/06/2015 102* 65 - 99 mg/dL Final  . BUN 07/06/2015 17  6 - 20 mg/dL Final  . Creatinine, Ser 07/06/2015 0.66  0.44 - 1.00 mg/dL Final  . Calcium 07/06/2015 9.5  8.9 - 10.3 mg/dL Final  . Total Protein 07/06/2015 7.3  6.5 - 8.1 g/dL Final  . Albumin 07/06/2015 4.4  3.5 - 5.0 g/dL Final  . AST 07/06/2015 24  15 - 41 U/L Final  . ALT 07/06/2015 23  14 - 54 U/L Final  . Alkaline Phosphatase 07/06/2015 40  38 - 126 U/L Final  . Total Bilirubin 07/06/2015 0.8  0.3 - 1.2 mg/dL Final  . GFR calc non Af Amer 07/06/2015 >60  >60 mL/min Final  . GFR calc Af Amer 07/06/2015 >60  >60 mL/min Final   Comment: (NOTE) The eGFR has been calculated using the CKD EPI equation. This calculation has not been validated in all clinical situations. eGFR's persistently <60 mL/min signify possible Chronic Kidney Disease.   . Anion gap 07/06/2015 8  5 - 15 Final  . CA 27.29 07/06/2015 85.2* 0.0 - 38.6 U/mL Final   Comment: (NOTE) [employer]/ACS methodology Performed At: [facility] [place], [place] [id number] Lindon Romp MD [id number]     II   ASSESSMENT: 1.  Patient had a history of ductal carcinoma in situ  several years ago had lumpectomy and radiation therapy to the right breast. If edema is decrease in right upper extremity.. T4 N1 M0 cancer diagnosis on the right side on June of 2015 Patient did not want any radiation chemotherapy at that time Now has recurrent disease patient is on [facility] and letrozole.  I had detailed discussion with patient regarding progressing disease.  Based on PET scan.  In by tumor markers.  An clinical examination.  Patient is against any form of chemotherapy and considering the soft tissue metastases in lymph nodes only BE would continue to try anti-hormonal therapy.  Possibility of Faslodex and IBRANCE is being considered.  I discussed that with the patient's is in agreement with the various side effects has been discussed. Patient will be reevaluated in 4 weeks by my associate Dr. Jacinto Reap  Total duration of visit was 25 minutes.  50% or more time was spent in counseling patient and family regarding prognosis and options of treatment and available resources  Patient was explained all the side effects of chemotherapy.  And informed consent has been obtained  was in agreement with this plan. She also understands that She can call clinic at any time with any questions, concerns, or complaints.    No matching staging information was found for the patient.  Forest Gleason, MD   07/27/2015 7:30 PM

## 2015-08-02 ENCOUNTER — Other Ambulatory Visit: Payer: Self-pay | Admitting: *Deleted

## 2015-08-02 DIAGNOSIS — C50011 Malignant neoplasm of nipple and areola, right female breast: Secondary | ICD-10-CM

## 2015-08-02 MED ORDER — PALBOCICLIB 100 MG PO CAPS: 100.0000 mg | ORAL_CAPSULE | Freq: Every day | ORAL | Status: DC

## 2015-08-03 ENCOUNTER — Other Ambulatory Visit: Payer: Medicare HMO

## 2015-08-03 ENCOUNTER — Ambulatory Visit: Payer: Medicare HMO | Admitting: Oncology

## 2015-08-03 ENCOUNTER — Encounter: Payer: Self-pay | Admitting: *Deleted

## 2015-08-03 ENCOUNTER — Other Ambulatory Visit: Payer: Self-pay | Admitting: *Deleted

## 2015-08-05 ENCOUNTER — Other Ambulatory Visit: Payer: Self-pay | Admitting: Endocrinology

## 2015-08-05 DIAGNOSIS — Z1231 Encounter for screening mammogram for malignant neoplasm of breast: Secondary | ICD-10-CM

## 2015-08-08 ENCOUNTER — Ambulatory Visit: Payer: Medicare HMO | Admitting: Anesthesiology

## 2015-08-08 ENCOUNTER — Encounter
Admission: RE | Disposition: A | Discharge status: Home / Self Care | Payer: Self-pay | Source: Ambulatory Visit | Attending: Ophthalmology

## 2015-08-08 ENCOUNTER — Encounter: Payer: Self-pay | Admitting: *Deleted

## 2015-08-08 ENCOUNTER — Ambulatory Visit
Admission: RE | Admit: 2015-08-08 | Discharge: 2015-08-08 | Disposition: A | Discharge status: Home / Self Care | Payer: Medicare HMO | Source: Ambulatory Visit | Attending: Ophthalmology | Admitting: Ophthalmology

## 2015-08-08 DIAGNOSIS — Z87891 Personal history of nicotine dependence: Secondary | ICD-10-CM | POA: Insufficient documentation

## 2015-08-08 DIAGNOSIS — Z9221 Personal history of antineoplastic chemotherapy: Secondary | ICD-10-CM | POA: Insufficient documentation

## 2015-08-08 DIAGNOSIS — Z96659 Presence of unspecified artificial knee joint: Secondary | ICD-10-CM | POA: Diagnosis not present

## 2015-08-08 DIAGNOSIS — I10 Essential (primary) hypertension: Secondary | ICD-10-CM | POA: Insufficient documentation

## 2015-08-08 DIAGNOSIS — M1990 Unspecified osteoarthritis, unspecified site: Secondary | ICD-10-CM | POA: Diagnosis not present

## 2015-08-08 DIAGNOSIS — H2512 Age-related nuclear cataract, left eye: Secondary | ICD-10-CM | POA: Insufficient documentation

## 2015-08-08 DIAGNOSIS — Z853 Personal history of malignant neoplasm of breast: Secondary | ICD-10-CM | POA: Insufficient documentation

## 2015-08-08 DIAGNOSIS — Z881 Allergy status to other antibiotic agents status: Secondary | ICD-10-CM | POA: Insufficient documentation

## 2015-08-08 DIAGNOSIS — Z90710 Acquired absence of both cervix and uterus: Secondary | ICD-10-CM | POA: Diagnosis not present

## 2015-08-08 DIAGNOSIS — H9193 Unspecified hearing loss, bilateral: Secondary | ICD-10-CM | POA: Diagnosis not present

## 2015-08-08 HISTORY — DX: Unspecified osteoarthritis, unspecified site: M19.90

## 2015-08-08 HISTORY — PX: CATARACT EXTRACTION W/PHACO: SHX586

## 2015-08-08 HISTORY — DX: Unspecified hearing loss, unspecified ear: H91.90

## 2015-08-08 SURGERY — PHACOEMULSIFICATION, CATARACT, WITH IOL INSERTION
Anesthesia: Monitor Anesthesia Care | Site: Eye | Laterality: Left | Wound class: Clean

## 2015-08-08 MED ORDER — NA HYALUR & NA CHOND-NA HYALUR 0.4-0.35 ML IO KIT
PACK | INTRAOCULAR | Status: DC | PRN
Start: 2015-08-08 — End: 2015-08-08
  Administered 2015-08-08: .35 mL via INTRAOCULAR

## 2015-08-08 MED ORDER — NEOMYCIN-POLYMYXIN-DEXAMETH 0.1 % OP OINT
TOPICAL_OINTMENT | OPHTHALMIC | Status: DC | PRN
  Administered 2015-08-08: 1 via OPHTHALMIC

## 2015-08-08 MED ORDER — NEOMYCIN-POLYMYXIN-DEXAMETH 3.5-10000-0.1 OP OINT
TOPICAL_OINTMENT | OPHTHALMIC | Status: AC
  Filled 2015-08-08: qty 3.5

## 2015-08-08 MED ORDER — ARMC OPHTHALMIC DILATING GEL
OPHTHALMIC | Status: AC
  Administered 2015-08-08: 1 via OPHTHALMIC
  Filled 2015-08-08: qty 0.25

## 2015-08-08 MED ORDER — CARBACHOL 0.01 % IO SOLN
INTRAOCULAR | Status: DC | PRN
  Administered 2015-08-08: 0.5 mL via INTRAOCULAR

## 2015-08-08 MED ORDER — POVIDONE-IODINE 5 % OP SOLN
1.0000 "application " | Freq: Once | OPHTHALMIC | Status: AC
  Administered 2015-08-08: 1 via OPHTHALMIC

## 2015-08-08 MED ORDER — BSS IO SOLN
INTRAOCULAR | Status: DC | PRN
  Administered 2015-08-08: 12:00:00 via OPHTHALMIC

## 2015-08-08 MED ORDER — MOXIFLOXACIN HCL 0.5 % OP SOLN
OPHTHALMIC | Status: AC
  Filled 2015-08-08: qty 3

## 2015-08-08 MED ORDER — EPINEPHRINE HCL 1 MG/ML IJ SOLN
INTRAMUSCULAR | Status: AC
  Filled 2015-08-08: qty 1

## 2015-08-08 MED ORDER — CEFUROXIME OPHTHALMIC INJECTION 1 MG/0.1 ML
INJECTION | OPHTHALMIC | Status: AC
Start: 2015-08-08 — End: 2015-08-08
  Filled 2015-08-08: qty 0.1

## 2015-08-08 MED ORDER — TETRACAINE HCL 0.5 % OP SOLN
OPHTHALMIC | Status: AC
Start: 2015-08-08 — End: 2015-08-08
  Administered 2015-08-08: 1 [drp] via OPHTHALMIC
  Filled 2015-08-08: qty 2

## 2015-08-08 MED ORDER — EPINEPHRINE HCL 1 MG/ML IJ SOLN
INTRAMUSCULAR | Status: DC | PRN
  Administered 2015-08-08: 12:00:00 via OPHTHALMIC

## 2015-08-08 MED ORDER — MOXIFLOXACIN HCL 0.5 % OP SOLN: 1.0000 [drp] | OPHTHALMIC | Status: DC | PRN

## 2015-08-08 MED ORDER — SODIUM CHLORIDE 0.9 % IV SOLN
INTRAVENOUS | Status: DC
Start: 2015-08-08 — End: 2015-08-08
  Administered 2015-08-08: 11:00:00 via INTRAVENOUS

## 2015-08-08 MED ORDER — CEFUROXIME OPHTHALMIC INJECTION 1 MG/0.1 ML
INJECTION | OPHTHALMIC | Status: DC | PRN
  Administered 2015-08-08: 0.1 mL via INTRACAMERAL

## 2015-08-08 MED ORDER — LIDOCAINE HCL (PF) 4 % IJ SOLN
INTRAMUSCULAR | Status: AC
  Filled 2015-08-08: qty 5

## 2015-08-08 MED ORDER — SODIUM CHLORIDE 0.9 % IV SOLN
INTRAVENOUS | Status: DC | PRN
  Administered 2015-08-08: 11:00:00 via INTRAVENOUS

## 2015-08-08 MED ORDER — POVIDONE-IODINE 5 % OP SOLN
OPHTHALMIC | Status: AC
  Administered 2015-08-08: 1 via OPHTHALMIC
  Filled 2015-08-08: qty 30

## 2015-08-08 MED ORDER — TETRACAINE HCL 0.5 % OP SOLN
1.0000 [drp] | Freq: Once | OPHTHALMIC | Status: AC
  Administered 2015-08-08: 1 [drp] via OPHTHALMIC

## 2015-08-08 MED ORDER — NA CHONDROIT SULF-NA HYALURON 40-30 MG/ML IO SOLN
INTRAOCULAR | Status: DC | PRN
  Administered 2015-08-08: 0.5 mL via INTRAOCULAR

## 2015-08-08 MED ORDER — ARMC OPHTHALMIC DILATING GEL
1.0000 "application " | OPHTHALMIC | Status: AC | PRN
  Administered 2015-08-08 (×2): 1 via OPHTHALMIC

## 2015-08-08 SURGICAL SUPPLY — 22 items
CANNULA ANT/CHMB 27G (MISCELLANEOUS) ×1 IMPLANT
CANNULA ANT/CHMB 27GA (MISCELLANEOUS) ×3 IMPLANT
CUP MEDICINE 2OZ PLAST GRAD ST (MISCELLANEOUS) ×3 IMPLANT
GLOVE BIO SURGEON STRL SZ8 (GLOVE) ×3 IMPLANT
GLOVE BIOGEL M 6.5 STRL (GLOVE) ×3 IMPLANT
GLOVE SURG LX 7.5 STRW (GLOVE) ×2
GLOVE SURG LX STRL 7.5 STRW (GLOVE) ×1 IMPLANT
GOWN STRL REUS W/ TWL LRG LVL3 (GOWN DISPOSABLE) ×2 IMPLANT
GOWN STRL REUS W/TWL LRG LVL3 (GOWN DISPOSABLE) ×6
LENS IOL TECNIS ITEC 22.5 (Intraocular Lens) ×2 IMPLANT
PACK CATARACT (MISCELLANEOUS) ×3 IMPLANT
PACK CATARACT BRASINGTON LX (MISCELLANEOUS) ×3 IMPLANT
PACK EYE AFTER SURG (MISCELLANEOUS) ×3 IMPLANT
SOL BSS BAG (MISCELLANEOUS) ×3
SOL PREP PVP 2OZ (MISCELLANEOUS) ×3
SOLUTION BSS BAG (MISCELLANEOUS) ×1 IMPLANT
SOLUTION PREP PVP 2OZ (MISCELLANEOUS) ×1 IMPLANT
SYR 3ML LL SCALE MARK (SYRINGE) ×3 IMPLANT
SYR 5ML LL (SYRINGE) ×3 IMPLANT
SYR TB 1ML 27GX1/2 LL (SYRINGE) ×3 IMPLANT
WATER STERILE IRR 1000ML POUR (IV SOLUTION) ×3 IMPLANT
WIPE NON LINTING 3.25X3.25 (MISCELLANEOUS) ×3 IMPLANT

## 2015-08-08 NOTE — Transfer of Care (Signed)
Immediate Anesthesia Transfer of Care Note  Patient: Hayley [name]  Procedure(s) Performed: Procedure(s) with comments: CATARACT EXTRACTION PHACO AND INTRAOCULAR LENS PLACEMENT (IOC) (Left) - [place] 58.0 AP% 14.9 CDE 8.60 Fluid Pack lot # [id number]  Patient Location: PACU  Anesthesia Type:MAC  Level of Consciousness: awake, alert  and oriented  Airway & Oxygen Therapy: Patient Spontanous Breathing  Post-op Assessment: Report given to RN  Post vital signs: Reviewed and stable  Last Vitals:  Filed Vitals:   08/08/15 1026 08/08/15 1148  BP: 156/61 161/79  Pulse: 76   Temp: 36.7 C 37 C  Resp: 18 16    Last Pain:  Filed Vitals:   08/08/15 1148  PainSc: 0-No pain         Complications: No apparent anesthesia complications

## 2015-08-08 NOTE — Anesthesia Preprocedure Evaluation (Signed)
Anesthesia Evaluation  Patient identified by MRN, date of birth, ID band Patient awake    Reviewed: Allergy & Precautions, NPO status , Patient's Chart, lab work & pertinent test results, reviewed documented beta blocker date and time   Airway Mallampati: II  TM Distance: >3 FB     Dental  (+) Chipped   Pulmonary           Cardiovascular hypertension, Pt. on medications      Neuro/Psych    GI/Hepatic   Endo/Other    Renal/GU      Musculoskeletal  (+) Arthritis ,   Abdominal   Peds  Hematology   Anesthesia Other Findings   Reproductive/Obstetrics                             Anesthesia Physical Anesthesia Plan  ASA: III  Anesthesia Plan: MAC   Post-op Pain Management:    Induction: Intravenous  Airway Management Planned:   Additional Equipment:   Intra-op Plan:   Post-operative Plan:   Informed Consent: I have reviewed the patients History and Physical, chart, labs and discussed the procedure including the risks, benefits and alternatives for the proposed anesthesia with the patient or authorized representative who has indicated his/her understanding and acceptance.     Plan Discussed with: CRNA  Anesthesia Plan Comments:         Anesthesia Quick Evaluation

## 2015-08-08 NOTE — Discharge Instructions (Addendum)
Follow eye drop schedule, 4 drops a day of eye drop med at home. Keep appt tomorrow.  Cataract Surgery, Care After Refer to this sheet in the next few weeks. These instructions provide you with information on caring for yourself after your procedure. Your caregiver may also give you more specific instructions. Your treatment has been planned according to current medical practices, but problems sometimes occur. Call your caregiver if you have any problems or questions after your procedure.  HOME CARE INSTRUCTIONS   Avoid strenuous activities as directed by your caregiver.  Ask your caregiver when you can resume driving.  Use eyedrops or other medicines to help healing and control pressure inside your eye as directed by your caregiver.  Only take over-the-counter or prescription medicines for pain, discomfort, or fever as directed by your caregiver.  Do not to touch or rub your eyes.  You may be instructed to use a protective shield during the first few days and nights after surgery. If not, wear sunglasses to protect your eyes. This is to protect the eye from pressure or from being accidentally bumped.  Keep the area around your eye clean and dry. Avoid swimming or allowing water to hit you directly in the face while showering. Keep soap and shampoo out of your eyes.  Do not bend or lift heavy objects. Bending increases pressure in the eye. You can walk, climb stairs, and do light household chores.  Do not put a contact lens into the eye that had surgery until your caregiver says it is okay to do so.  Ask your doctor when you can return to work. This will depend on the kind of work that you do. If you work in a dusty environment, you may be advised to wear protective eyewear for a period of time.  Ask your caregiver when it will be safe to engage in sexual activity.  Continue with your regular eye exams as directed by your caregiver. What to expect:  It is normal to feel itching and  mild discomfort for a few days after cataract surgery. Some fluid discharge is also common, and your eye may be sensitive to light and touch.  After 1 to 2 days, even moderate discomfort should disappear. In most cases, healing will take about 6 weeks.  If you received an intraocular lens (IOL), you may notice that colors are very bright or have a blue tinge. Also, if you have been in bright sunlight, everything may appear reddish for a few hours. If you see these color tinges, it is because your lens is clear and no longer cloudy. Within a few months after receiving an IOL, these extra colors should go away. When you have healed, you will probably need new glasses. SEEK MEDICAL CARE IF:   You have increased bruising around your eye.  You have discomfort not helped by medicine. SEEK IMMEDIATE MEDICAL CARE IF:   You have a fever.  You have a worsening or sudden vision loss.  You have redness, swelling, or increasing pain in the eye.  You have a thick discharge from the eye that had surgery. MAKE SURE YOU:  Understand these instructions.  Will watch your condition.  Will get help right away if you are not doing well or get worse.   This information is not intended to replace advice given to you by your health care provider. Make sure you discuss any questions you have with your health care provider.   Document Released: 09/08/2004 Document Revised: 03/12/2014  Document Reviewed: 10/13/2010 Elsevier Interactive Patient Education [employer].

## 2015-08-08 NOTE — Anesthesia Postprocedure Evaluation (Signed)
Anesthesia Post Note  Patient: Hayley [name]  Procedure(s) Performed: Procedure(s) (LRB): CATARACT EXTRACTION PHACO AND INTRAOCULAR LENS PLACEMENT (IOC) (Left)  Patient location during evaluation: Other Anesthesia Type: General Level of consciousness: awake and alert Pain management: pain level controlled Vital Signs Assessment: post-procedure vital signs reviewed and stable Respiratory status: spontaneous breathing, nonlabored ventilation, respiratory function stable and patient connected to nasal cannula oxygen Cardiovascular status: blood pressure returned to baseline and stable Postop Assessment: no signs of nausea or vomiting Anesthetic complications: no    Last Vitals:  Filed Vitals:   08/08/15 1148 08/08/15 1154  BP: 161/79 163/69  Pulse:  76  Temp: 37 C 36.6 C  Resp: 16 18    Last Pain:  Filed Vitals:   08/08/15 1155  PainSc: 0-No pain                 [name] Giarrusso S

## 2015-08-08 NOTE — H&P (Signed)
  The History and Physical notes are on paper, have been signed, and are to be scanned. The patient remains stable and unchanged from the H&P.   Previous H&P reviewed, patient examined, and there are no changes.  Hayley [name] 08/08/2015 10:57 AM

## 2015-08-08 NOTE — Op Note (Signed)
OPERATIVE NOTE  Hayley [name] [id number] 08/08/2015   PREOPERATIVE DIAGNOSIS:  Nuclear sclerotic cataract left eye. H25.12   POSTOPERATIVE DIAGNOSIS:    Nuclear sclerotic cataract left eye.     PROCEDURE:  Phacoemusification with posterior chamber intraocular lens placement of the left eye   LENS:   Implant Name Type Inv. Item Serial No. Manufacturer Lot No. LRB No. Used  LENS IOL DIOP 22.5 - [id number] Intraocular Lens LENS IOL DIOP 22.5 [id number]   Left 1        ULTRASOUND TIME: 15  % of 0 minutes 58 seconds, CDE 8.6  SURGEON:  Wyonia Hough, MD   ANESTHESIA:  Topical with tetracaine drops and 2% Xylocaine jelly, augmented with 1% preservative-free intracameral lidocaine.    COMPLICATIONS:  None.   DESCRIPTION OF PROCEDURE:  The patient was identified in the holding room and transported to the operating room and placed in the supine position under the operating microscope.  The left eye was identified as the operative eye and it was prepped and draped in the usual sterile ophthalmic fashion.   A 1 millimeter clear-corneal paracentesis was made at the 1:30 position.  0.5 ml of preservative-free 1% lidocaine was injected into the anterior chamber.  The anterior chamber was filled with Viscoat viscoelastic.  A 2.4 millimeter keratome was used to make a near-clear corneal incision at the 10:30 position.  .  A curvilinear capsulorrhexis was made with a cystotome and capsulorrhexis forceps.  Balanced salt solution was used to hydrodissect and hydrodelineate the nucleus.   Phacoemulsification was then used in stop and chop fashion to remove the lens nucleus and epinucleus.  The remaining cortex was then removed using the irrigation and aspiration handpiece. Provisc was then placed into the capsular bag to distend it for lens placement.  A lens was then injected into the capsular bag.  The remaining viscoelastic was aspirated.   Wounds were hydrated with balanced salt  solution.  The anterior chamber was inflated to a physiologic pressure with balanced salt solution.  No wound leaks were noted. Cefuroxime 0.1 ml of a 10mg /ml solution was injected into the anterior chamber for a dose of 1 mg of intracameral antibiotic at the completion of the case.   Timolol and Brimonidine drops were applied to the eye.  The patient was taken to the recovery room in stable condition without complications of anesthesia or surgery.  Hayley [name] 08/08/2015, 11:49 AM

## 2015-08-09 ENCOUNTER — Telehealth: Payer: Self-pay | Admitting: *Deleted

## 2015-08-09 ENCOUNTER — Other Ambulatory Visit: Payer: Self-pay | Admitting: *Deleted

## 2015-08-09 NOTE — Telephone Encounter (Signed)
Called to state she is confused about what she is to do, she is to start Ibrance but said the Letrozole is not working for her and her insurance co said she was approved for an injection. Is she to get the injection when she starts the [place]?  She has no appts scheduled, when does she need to come back in for FU?

## 2015-08-09 NOTE — Telephone Encounter (Signed)
Called returned call to pt and left msg on VM regarding appts and that per Dr Jeb Levering, she is to start [place] as planned

## 2015-08-10 ENCOUNTER — Other Ambulatory Visit: Payer: Medicare HMO

## 2015-08-10 ENCOUNTER — Ambulatory Visit: Payer: Medicare HMO

## 2015-08-10 ENCOUNTER — Ambulatory Visit: Payer: Medicare HMO | Admitting: Internal Medicine

## 2015-08-15 ENCOUNTER — Inpatient Hospital Stay: Payer: Medicare HMO | Attending: Internal Medicine

## 2015-08-15 DIAGNOSIS — C50811 Malignant neoplasm of overlapping sites of right female breast: Secondary | ICD-10-CM | POA: Insufficient documentation

## 2015-08-15 DIAGNOSIS — C7951 Secondary malignant neoplasm of bone: Secondary | ICD-10-CM | POA: Diagnosis not present

## 2015-08-15 DIAGNOSIS — Z79818 Long term (current) use of other agents affecting estrogen receptors and estrogen levels: Secondary | ICD-10-CM | POA: Diagnosis not present

## 2015-08-15 DIAGNOSIS — C50011 Malignant neoplasm of nipple and areola, right female breast: Secondary | ICD-10-CM

## 2015-08-15 MED ORDER — FULVESTRANT 250 MG/5ML IM SOLN
500.0000 mg | INTRAMUSCULAR | Status: DC
  Administered 2015-08-15: 500 mg via INTRAMUSCULAR
  Filled 2015-08-15: qty 10

## 2015-08-17 ENCOUNTER — Ambulatory Visit
Admission: RE | Admit: 2015-08-17 | Discharge: 2015-08-17 | Disposition: A | Discharge status: Home / Self Care | Payer: Medicare HMO | Source: Ambulatory Visit | Attending: Endocrinology | Admitting: Endocrinology

## 2015-08-17 ENCOUNTER — Other Ambulatory Visit: Payer: Self-pay | Admitting: Endocrinology

## 2015-08-17 DIAGNOSIS — Z1231 Encounter for screening mammogram for malignant neoplasm of breast: Secondary | ICD-10-CM | POA: Diagnosis present

## 2015-08-29 ENCOUNTER — Inpatient Hospital Stay: Payer: Medicare HMO

## 2015-08-29 DIAGNOSIS — C50011 Malignant neoplasm of nipple and areola, right female breast: Secondary | ICD-10-CM

## 2015-08-29 DIAGNOSIS — C50811 Malignant neoplasm of overlapping sites of right female breast: Secondary | ICD-10-CM | POA: Diagnosis not present

## 2015-08-29 MED ORDER — FULVESTRANT 250 MG/5ML IM SOLN
500.0000 mg | INTRAMUSCULAR | Status: DC
Start: 2015-08-29 — End: 2015-08-29
  Administered 2015-08-29: 500 mg via INTRAMUSCULAR
  Filled 2015-08-29: qty 10

## 2015-09-12 ENCOUNTER — Inpatient Hospital Stay: Payer: Medicare HMO | Attending: Internal Medicine | Admitting: Internal Medicine

## 2015-09-12 ENCOUNTER — Inpatient Hospital Stay: Payer: Medicare HMO

## 2015-09-12 ENCOUNTER — Other Ambulatory Visit: Payer: Self-pay | Admitting: Internal Medicine

## 2015-09-12 ENCOUNTER — Encounter: Payer: Self-pay | Admitting: Internal Medicine

## 2015-09-12 VITALS — BP 148/73 | HR 85 | Temp 98.0°F | Resp 18 | Wt 112.9 lb

## 2015-09-12 DIAGNOSIS — M129 Arthropathy, unspecified: Secondary | ICD-10-CM | POA: Insufficient documentation

## 2015-09-12 DIAGNOSIS — C50811 Malignant neoplasm of overlapping sites of right female breast: Secondary | ICD-10-CM

## 2015-09-12 DIAGNOSIS — C50011 Malignant neoplasm of nipple and areola, right female breast: Secondary | ICD-10-CM

## 2015-09-12 DIAGNOSIS — Z79899 Other long term (current) drug therapy: Secondary | ICD-10-CM | POA: Insufficient documentation

## 2015-09-12 DIAGNOSIS — Z170 Estrogen receptor positive status [ER+]: Secondary | ICD-10-CM | POA: Diagnosis not present

## 2015-09-12 DIAGNOSIS — I10 Essential (primary) hypertension: Secondary | ICD-10-CM | POA: Diagnosis not present

## 2015-09-12 DIAGNOSIS — Z79818 Long term (current) use of other agents affecting estrogen receptors and estrogen levels: Secondary | ICD-10-CM | POA: Diagnosis not present

## 2015-09-12 LAB — COMPREHENSIVE METABOLIC PANEL
ALT: 23 U/L (ref 14–54)
AST: 27 U/L (ref 15–41)
Albumin: 4.4 g/dL (ref 3.5–5.0)
Alkaline Phosphatase: 42 U/L (ref 38–126)
Anion gap: 7 (ref 5–15)
BILIRUBIN TOTAL: 0.9 mg/dL (ref 0.3–1.2)
BUN: 20 mg/dL (ref 6–20)
CALCIUM: 9.1 mg/dL (ref 8.9–10.3)
CHLORIDE: 103 mmol/L (ref 101–111)
CO2: 25 mmol/L (ref 22–32)
CREATININE: 0.81 mg/dL (ref 0.44–1.00)
Glucose, Bld: 135 mg/dL — ABNORMAL HIGH (ref 65–99)
Potassium: 3.9 mmol/L (ref 3.5–5.1)
Sodium: 135 mmol/L (ref 135–145)
TOTAL PROTEIN: 6.6 g/dL (ref 6.5–8.1)

## 2015-09-12 LAB — CBC WITH DIFFERENTIAL/PLATELET
Basophils Absolute: 0.1 10*3/uL (ref 0–0.1)
Basophils Relative: 4 %
EOS PCT: 4 %
Eosinophils Absolute: 0.1 10*3/uL (ref 0–0.7)
HEMATOCRIT: 37.9 % (ref 35.0–47.0)
Hemoglobin: 13.3 g/dL (ref 12.0–16.0)
LYMPHS ABS: 0.6 10*3/uL — AB (ref 1.0–3.6)
LYMPHS PCT: 16 %
MCH: 36.3 pg — AB (ref 26.0–34.0)
MCHC: 35 g/dL (ref 32.0–36.0)
MCV: 104 fL — AB (ref 80.0–100.0)
MONO ABS: 0.2 10*3/uL (ref 0.2–0.9)
Monocytes Relative: 7 %
NEUTROS ABS: 2.5 10*3/uL (ref 1.4–6.5)
Neutrophils Relative %: 69 %
PLATELETS: 196 10*3/uL (ref 150–440)
RBC: 3.65 MIL/uL — AB (ref 3.80–5.20)
RDW: 15.2 % — ABNORMAL HIGH (ref 11.5–14.5)
WBC: 3.5 10*3/uL — ABNORMAL LOW (ref 3.6–11.0)

## 2015-09-12 MED ORDER — FULVESTRANT 250 MG/5ML IM SOLN
500.0000 mg | Freq: Once | INTRAMUSCULAR | Status: AC
Start: 2015-09-12 — End: 2015-09-12
  Administered 2015-09-12: 500 mg via INTRAMUSCULAR
  Filled 2015-09-12: qty 10

## 2015-09-12 NOTE — Assessment & Plan Note (Addendum)
Metastatic ER/PR positive HER-2/neu negative breast cancer currently on Faslodex /ibrance ;Today day # 7 of her ibrance. Patient's had 2 doses of loading Faslodex; she is due for one today. She is tolerating it well. No clinical progression of disease. Patient understands the treatments are palliative; and she will be on indefinite treatments.   # I reviewed the tumor markers PET scan with the patient detail. I reviewed the images of PET scan from April 2017 in detail-summarized above.  # Patient follow-up with me in approximately 1 month for her Faslodex/labs.

## 2015-09-12 NOTE — Progress Notes (Signed)
[name] OFFICE PROGRESS NOTE  Patient Care Team: Lenard Simmer, MD as PCP - General (Endocrinology)  Cancer of breast [facility])   Staging form: Breast, AJCC 7th Edition     Clinical: Stage IV (T4, N1, M1) - Signed by Forest Gleason, MD on 01/20/2015    Oncology History   # June 2015-RIGHT BREAST CA ? 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 I reviewed the patient's prior charts/pertinent labs/imaging in detail; findings are summarized above.   Hayley Don89y.o.  female pleasant patient above history of Metastatic ER/PR positive HER-2/neu negative breast cancer currently on ibrance plus Faslodex is here for follow-up.  Patient denies any significant hot flashes. Denies any pain. She has good energy levels. No nausea no vomiting. Her shortness of breath or chest pain. No cough. She is fairly active.   REVIEW OF SYSTEMS:  A complete 10 point review of system is done which is negative except mentioned above/history of present illness.   PAST MEDICAL HISTORY :  Past Medical History  Diagnosis Date  . Breast cancer ([facility] 17[place] years ago    Right Breast c radiation -- 2015 Mastectomy  . Hypertension    . [facility] of breast ([facility] [place] 01/[place]/2015  . Arthritis   . HOH (hard of hearing)     AIDS    PAST SURGICAL HISTORY :   Past Surgical History  Procedure Laterality Date  . Mastectomy Right 2015    After screening in May 2015 - Pt choice  . Abdominal hysterectomy    . Joint replacement Right 2008    knee  . Breast lumpectomy Right 1998    c Radiation  . Cataract extraction w/phaco Left 08/08/2015    Procedure: CATARACT EXTRACTION PHACO AND INTRAOCULAR LENS PLACEMENT (IOC);  Surgeon: CLeandrew Koyanagi MD;  Location: [facility] ORS;  Service: Ophthalmology;  Laterality: Left;  [place]58.[place] AP% 14.9 CDE 8.60 Fluid Pack lot # [id number]    FAMILY HISTORY :  No family history on file.  SOCIAL HISTORY:   Social History  Substance Use Topics  . Smoking status: Never Smoker   . Smokeless tobacco: Not on file  . Alcohol Use: No    ALLERGIES:  is allergic to tetracyclines & related.  MEDICATIONS:  Current Outpatient Prescriptions  Medication Sig Dispense Refill  . lisinopril (PRINIVIL,ZESTRIL) 5 MG tablet Take by mouth.    . palbociclib (IBRANCE) 100 MG capsule Take 1 capsule (100 mg total) by mouth daily with breakfast. Take whole with food. Take for 3 weeks, then 1 week off. 21 capsule 3  . prednisoLONE acetate (PRED FORTE) 1 % ophthalmic suspension See admin instructions.  1   No current facility-administered medications  for this visit.    PHYSICAL EXAMINATION: ECOG PERFORMANCE STATUS: 0 - Asymptomatic  BP 148/73 mmHg  Pulse 85  Temp(Src) 98 F (36.7 C) (Tympanic)  Resp 18  Wt 112 lb 14 oz (51.2 kg)  Filed Weights   09/12/15 1412  Weight: 112 lb 14 oz (51.2 kg)    GENERAL: Well-nourished well-developed; Alert, no distress and comfortable.  Alone.  EYES: no pallor or icterus OROPHARYNX: no thrush or ulceration; good dentition  NECK: supple, right supraclavicular adenopathy felt.  LYMPH:  no palpable lymphadenopathy in the cervical, axillary or inguinal regions LUNGS: clear to  auscultation and  No wheeze or crackles HEART/CVS: regular rate & rhythm and no murmurs; No lower extremity edema ABDOMEN:abdomen soft, non-tender and normal bowel sounds Musculoskeletal:no cyanosis of digits and no clubbing  PSYCH: alert & oriented x 3 with fluent speech NEURO: no focal motor/sensory deficits SKIN:  no rashes or significant lesions  LABORATORY DATA:  I have reviewed the data as listed    Component Value Date/Time   NA 135 09/12/2015 1350   K 3.9 09/12/2015 1350   CL 103 09/12/2015 1350   CO2 25 09/12/2015 1350   GLUCOSE 135* 09/12/2015 1350   BUN 20 09/12/2015 1350   CREATININE 0.81 09/12/2015 1350   CALCIUM 9.1 09/12/2015 1350   PROT 6.6 09/12/2015 1350   ALBUMIN 4.4 09/12/2015 1350   AST 27 09/12/2015 1350   ALT 23 09/12/2015 1350   ALKPHOS 42 09/12/2015 1350   BILITOT 0.9 09/12/2015 1350   GFRNONAA >60 09/12/2015 1350   GFRAA >60 09/12/2015 1350    No results found for: SPEP, UPEP  Lab Results  Component Value Date   WBC 3.5* 09/12/2015   NEUTROABS 2.5 09/12/2015   HGB 13.3 09/12/2015   HCT 37.9 09/12/2015   MCV 104.0* 09/12/2015   PLT 196 09/12/2015      Chemistry      Component Value Date/Time   NA 135 09/12/2015 1350   K 3.9 09/12/2015 1350   CL 103 09/12/2015 1350   CO2 25 09/12/2015 1350   BUN 20 09/12/2015 1350   CREATININE 0.81 09/12/2015 1350      Component Value Date/Time   CALCIUM 9.1 09/12/2015 1350   ALKPHOS 42 09/12/2015 1350   AST 27 09/12/2015 1350   ALT 23 09/12/2015 1350   BILITOT 0.9 09/12/2015 1350       RADIOGRAPHIC STUDIES: I have personally reviewed the radiological images as listed and agreed with the findings in the report. No results found.   ASSESSMENT & PLAN:  Malignant neoplasm of overlapping sites of right breast ([place]) Metastatic ER/PR positive HER-2/neu negative breast cancer currently on Faslodex /ibrance ;Today day # 7 of her ibrance. Patient's had 2 doses of loading Faslodex; she is due for one  today. She is tolerating it well. No clinical progression of disease. Patient understands the treatments are palliative; and she will be on indefinite treatments.   # I reviewed the tumor markers PET scan with the patient detail. I reviewed the images of PET scan from April 2017 in detail-summarized above.  # Patient follow-up with me in approximately 1 month for her Faslodex/labs.     Orders Placed This Encounter  Procedures  . CBC with Differential    Standing Status: Future     Number of Occurrences:      Standing Expiration Date: 09/11/2016  . Comprehensive metabolic panel    Standing Status: Future     Number of Occurrences:  Standing Expiration Date: 09/11/2016    Order Specific Question:  Has the patient fasted?    Answer:  No   All questions were answered. The patient knows to call the clinic with any problems, questions or concerns.      Cammie Sickle, MD 09/12/2015 4:16 PM

## 2015-09-12 NOTE — Progress Notes (Signed)
Patient had her mammogram within the last month.

## 2015-09-13 LAB — CANCER ANTIGEN 27.29: CA 27.29: 67.4 U/mL — AB (ref 0.0–38.6)

## 2015-09-30 ENCOUNTER — Ambulatory Visit: Payer: Medicare HMO

## 2015-09-30 ENCOUNTER — Other Ambulatory Visit: Payer: Medicare HMO

## 2015-09-30 ENCOUNTER — Ambulatory Visit: Payer: Medicare HMO | Admitting: Internal Medicine

## 2015-10-10 ENCOUNTER — Inpatient Hospital Stay: Payer: Medicare HMO | Attending: Internal Medicine | Admitting: Internal Medicine

## 2015-10-10 ENCOUNTER — Encounter (INDEPENDENT_AMBULATORY_CARE_PROVIDER_SITE_OTHER): Payer: Self-pay

## 2015-10-10 ENCOUNTER — Inpatient Hospital Stay: Payer: Medicare HMO

## 2015-10-10 ENCOUNTER — Other Ambulatory Visit: Payer: Medicare HMO

## 2015-10-10 ENCOUNTER — Ambulatory Visit: Payer: Medicare HMO

## 2015-10-10 ENCOUNTER — Ambulatory Visit: Payer: Medicare HMO | Admitting: Internal Medicine

## 2015-10-10 VITALS — BP 169/99 | HR 83 | Temp 98.2°F | Resp 18 | Wt 113.5 lb

## 2015-10-10 DIAGNOSIS — C50811 Malignant neoplasm of overlapping sites of right female breast: Secondary | ICD-10-CM

## 2015-10-10 DIAGNOSIS — Z5111 Encounter for antineoplastic chemotherapy: Secondary | ICD-10-CM | POA: Insufficient documentation

## 2015-10-10 DIAGNOSIS — M129 Arthropathy, unspecified: Secondary | ICD-10-CM | POA: Insufficient documentation

## 2015-10-10 DIAGNOSIS — I10 Essential (primary) hypertension: Secondary | ICD-10-CM | POA: Insufficient documentation

## 2015-10-10 DIAGNOSIS — Z79899 Other long term (current) drug therapy: Secondary | ICD-10-CM | POA: Diagnosis not present

## 2015-10-10 DIAGNOSIS — C7801 Secondary malignant neoplasm of right lung: Secondary | ICD-10-CM | POA: Insufficient documentation

## 2015-10-10 DIAGNOSIS — C7802 Secondary malignant neoplasm of left lung: Secondary | ICD-10-CM | POA: Diagnosis not present

## 2015-10-10 DIAGNOSIS — Z923 Personal history of irradiation: Secondary | ICD-10-CM

## 2015-10-10 DIAGNOSIS — I890 Lymphedema, not elsewhere classified: Secondary | ICD-10-CM | POA: Diagnosis not present

## 2015-10-10 DIAGNOSIS — Z9011 Acquired absence of right breast and nipple: Secondary | ICD-10-CM | POA: Insufficient documentation

## 2015-10-10 DIAGNOSIS — Z170 Estrogen receptor positive status [ER+]: Secondary | ICD-10-CM | POA: Diagnosis not present

## 2015-10-10 LAB — COMPREHENSIVE METABOLIC PANEL
ALK PHOS: 40 U/L (ref 38–126)
ALT: 22 U/L (ref 14–54)
AST: 24 U/L (ref 15–41)
Albumin: 4.2 g/dL (ref 3.5–5.0)
Anion gap: 6 (ref 5–15)
BILIRUBIN TOTAL: 0.8 mg/dL (ref 0.3–1.2)
BUN: 17 mg/dL (ref 6–20)
CALCIUM: 8.9 mg/dL (ref 8.9–10.3)
CHLORIDE: 103 mmol/L (ref 101–111)
CO2: 26 mmol/L (ref 22–32)
Creatinine, Ser: 0.8 mg/dL (ref 0.44–1.00)
Glucose, Bld: 116 mg/dL — ABNORMAL HIGH (ref 65–99)
Potassium: 4.3 mmol/L (ref 3.5–5.1)
Sodium: 135 mmol/L (ref 135–145)
Total Protein: 6.5 g/dL (ref 6.5–8.1)

## 2015-10-10 LAB — CBC WITH DIFFERENTIAL/PLATELET
Basophils Absolute: 0.1 10*3/uL (ref 0–0.1)
Basophils Relative: 3 %
EOS PCT: 3 %
Eosinophils Absolute: 0.1 10*3/uL (ref 0–0.7)
HEMATOCRIT: 36.8 % (ref 35.0–47.0)
Hemoglobin: 12.8 g/dL (ref 12.0–16.0)
LYMPHS ABS: 0.6 10*3/uL — AB (ref 1.0–3.6)
LYMPHS PCT: 19 %
MCH: 36.7 pg — AB (ref 26.0–34.0)
MCHC: 34.9 g/dL (ref 32.0–36.0)
MCV: 105.3 fL — AB (ref 80.0–100.0)
Monocytes Absolute: 0.3 10*3/uL (ref 0.2–0.9)
Monocytes Relative: 9 %
NEUTROS ABS: 2.3 10*3/uL (ref 1.4–6.5)
Neutrophils Relative %: 66 %
PLATELETS: 206 10*3/uL (ref 150–440)
RBC: 3.49 MIL/uL — AB (ref 3.80–5.20)
RDW: 15.2 % — ABNORMAL HIGH (ref 11.5–14.5)
WBC: 3.4 10*3/uL — AB (ref 3.6–11.0)

## 2015-10-10 MED ORDER — FULVESTRANT 250 MG/5ML IM SOLN
500.0000 mg | Freq: Once | INTRAMUSCULAR | Status: AC
  Administered 2015-10-10: 500 mg via INTRAMUSCULAR
  Filled 2015-10-10: qty 10

## 2015-10-10 NOTE — Assessment & Plan Note (Addendum)
Metastatic ER/PR positive HER-2/neu negative breast cancer currently on Faslodex /ibrance ;Today day # 6 of her ibrance [financial issues]  # discussed treatments are indefinte- including Iv treatments in future. Pt not sure if she wants to do "IV chemo" but not completely against it either.   #  White count 3.4/ labs- okay.   # Patient follow-up with me in approximately 1 month for her Faslodex/labs.

## 2015-10-10 NOTE — Progress Notes (Signed)
[name] OFFICE PROGRESS NOTE  Patient Care Team: Lenard Simmer, MD as PCP - General (Endocrinology)  Cancer of breast [facility])   Staging form: Breast, AJCC 7th Edition     Clinical: Stage IV (T4, N1, M1) - Signed by Forest Gleason, MD on 01/20/2015    Oncology History   # June 2015-RIGHT BREAST CA ? 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  female pleasant patient above history of Metastatic ER/PR positive HER-2/neu negative breast cancer currently on ibrance plus Faslodex is here for follow-up.   No fever no chills. Denies any nausea vomiting diarrhea. Patient denies any significant hot flashes. Denies any pain. She has good energy levels. No nausea no vomiting. Her shortness of breath or chest pain. No cough. She is fairly active. She has been taking care of her grandchildren.  REVIEW OF SYSTEMS:  A complete 10 point review of system is done which is negative except mentioned above/history of present illness.   PAST MEDICAL HISTORY :  Past Medical History:  Diagnosis Date  . Arthritis   . Breast cancer ([facility] 17[place] years ago   Right Breast c radiation -- 2015 Mastectomy  . Cancer of breast ([place] 01/20/2015  . HOH (hard of hearing)    AIDS  . Hypertension      PAST SURGICAL HISTORY :   Past Surgical History:  Procedure Laterality Date  . ABDOMINAL HYSTERECTOMY    . BREAST LUMPECTOMY Right 1998   c Radiation  . CATARACT EXTRACTION W/PHACO Left 08/08/2015   Procedure: CATARACT EXTRACTION PHACO AND INTRAOCULAR LENS PLACEMENT (IOC);  Surgeon: CLeandrew Koyanagi MD;  Location: [facility] ORS;  Service: Ophthalmology;  Laterality: Left;  [place]58.[place] AP% 14.9 CDE 8.60 Fluid Pack lot # [id number]  . JOINT REPLACEMENT Right 2008   knee  . MASTECTOMY Right 2015   After screening in May 2015 - Pt choice    FAMILY HISTORY :  No family history on file.  SOCIAL HISTORY:   Social History  Substance Use Topics  . Smoking status: Never Smoker  . Smokeless tobacco: Not on file  . Alcohol use No    ALLERGIES:  is allergic to tetracyclines & related.  MEDICATIONS:  Current Outpatient Prescriptions  Medication Sig Dispense Refill  . lisinopril (PRINIVIL,ZESTRIL) 5 MG tablet Take by mouth.    . palbociclib (IBRANCE) 100 MG capsule Take 1 capsule (100 mg total) by mouth daily with breakfast. Take whole with food. Take for 3 weeks, then 1 week off. 21 capsule 3  . calcium carbonate (TUMS - DOSED IN MG ELEMENTAL CALCIUM) 500 MG chewable tablet     . Melatonin 5 MG/15ML LIQD      No  current facility-administered medications for this visit.     PHYSICAL EXAMINATION: ECOG PERFORMANCE STATUS: 0 - Asymptomatic  BP (!) 169/99 (BP Location: Left Arm, Patient Position: Sitting)   Pulse 83   Temp 98.2 F (36.8 C) (Tympanic)   Resp 18   Wt 113 lb 8 oz (51.5 kg)   BMI 18.89 kg/m   Filed Weights   10/10/15 1350  Weight: 113 lb 8 oz (51.5 kg)    GENERAL: Well-nourished well-developed; Alert, no distress and comfortable.  Alone.  EYES: no pallor or icterus OROPHARYNX: no thrush or ulceration; good dentition  NECK: supple, right supraclavicular adenopathy felt.  LYMPH:  no palpable lymphadenopathy in the cervical, axillary or inguinal regions LUNGS: clear  to auscultation and  No wheeze or crackles HEART/CVS: regular rate & rhythm and no murmurs; No lower extremity edema ABDOMEN:abdomen soft, non-tender and normal bowel sounds Musculoskeletal:no cyanosis of digits and no clubbing  PSYCH: alert & oriented x 3 with fluent speech NEURO: no focal motor/sensory deficits SKIN:  no rashes or significant lesions  LABORATORY DATA:  I have reviewed the data as listed    Component Value Date/Time   NA 135 10/10/2015 1326   K 4.3 10/10/2015 1326   CL 103 10/10/2015 1326   CO2 26 10/10/2015 1326   GLUCOSE 116 (H) 10/10/2015 1326   BUN 17 10/10/2015 1326   CREATININE 0.80 10/10/2015 1326   CALCIUM 8.9 10/10/2015 1326   PROT 6.5 10/10/2015 1326   ALBUMIN 4.2 10/10/2015 1326   AST 24 10/10/2015 1326   ALT 22 10/10/2015 1326   ALKPHOS 40 10/10/2015 1326   BILITOT 0.8 10/10/2015 1326   GFRNONAA >60 10/10/2015 1326   GFRAA >60 10/10/2015 1326    No results found for: SPEP, UPEP  Lab Results  Component Value Date   WBC 3.4 (L) 10/10/2015   NEUTROABS 2.3 10/10/2015   HGB 12.8 10/10/2015   HCT 36.8 10/10/2015   MCV 105.3 (H) 10/10/2015   PLT 206 10/10/2015      Chemistry      Component Value Date/Time   NA 135 10/10/2015 1326   K 4.3 10/10/2015 1326   CL 103 10/10/2015 1326   CO2 26 10/10/2015 1326   BUN 17 10/10/2015 1326   CREATININE 0.80 10/10/2015 1326      Component Value Date/Time   CALCIUM 8.9 10/10/2015 1326   ALKPHOS 40 10/10/2015 1326   AST 24 10/10/2015 1326   ALT 22 10/10/2015 1326   BILITOT 0.8 10/10/2015 1326       RADIOGRAPHIC STUDIES: I have personally reviewed the radiological images as listed and agreed with the findings in the report. No results found.   ASSESSMENT & PLAN:  Malignant neoplasm of overlapping sites of right breast ([place]) Metastatic ER/PR positive HER-2/neu negative breast cancer currently on Faslodex /ibrance ;Today day # 6 of her ibrance [financial issues]  # discussed treatments are  indefinte- including Iv treatments in future. Pt not sure if she wants to do "IV chemo" but not completely against it either.   #  White count 3.4/ labs- okay.   # Patient follow-up with me in approximately 1 month for her Faslodex/labs.    No orders of the defined types were placed in this encounter.  All questions were answered. The patient knows to call the clinic with any problems, questions or concerns.      Cammie Sickle, MD 10/10/2015 2:17 PM

## 2015-11-10 ENCOUNTER — Inpatient Hospital Stay: Payer: Medicare HMO | Attending: Internal Medicine

## 2015-11-10 ENCOUNTER — Telehealth: Payer: Self-pay | Admitting: *Deleted

## 2015-11-10 ENCOUNTER — Inpatient Hospital Stay (HOSPITAL_BASED_OUTPATIENT_CLINIC_OR_DEPARTMENT_OTHER): Payer: Medicare HMO | Admitting: Internal Medicine

## 2015-11-10 ENCOUNTER — Encounter: Payer: Self-pay | Admitting: Internal Medicine

## 2015-11-10 ENCOUNTER — Inpatient Hospital Stay: Payer: Medicare HMO

## 2015-11-10 VITALS — BP 167/72 | HR 76 | Temp 96.3°F | Resp 17 | Ht 65.0 in | Wt 112.8 lb

## 2015-11-10 DIAGNOSIS — C50811 Malignant neoplasm of overlapping sites of right female breast: Secondary | ICD-10-CM

## 2015-11-10 DIAGNOSIS — Z79818 Long term (current) use of other agents affecting estrogen receptors and estrogen levels: Secondary | ICD-10-CM | POA: Insufficient documentation

## 2015-11-10 DIAGNOSIS — D72819 Decreased white blood cell count, unspecified: Secondary | ICD-10-CM | POA: Diagnosis not present

## 2015-11-10 DIAGNOSIS — I890 Lymphedema, not elsewhere classified: Secondary | ICD-10-CM

## 2015-11-10 DIAGNOSIS — C7801 Secondary malignant neoplasm of right lung: Secondary | ICD-10-CM

## 2015-11-10 DIAGNOSIS — Z79811 Long term (current) use of aromatase inhibitors: Secondary | ICD-10-CM | POA: Insufficient documentation

## 2015-11-10 DIAGNOSIS — C7802 Secondary malignant neoplasm of left lung: Secondary | ICD-10-CM | POA: Insufficient documentation

## 2015-11-10 DIAGNOSIS — Z170 Estrogen receptor positive status [ER+]: Secondary | ICD-10-CM

## 2015-11-10 DIAGNOSIS — I10 Essential (primary) hypertension: Secondary | ICD-10-CM | POA: Insufficient documentation

## 2015-11-10 LAB — CBC WITH DIFFERENTIAL/PLATELET
BASOS ABS: 0.1 10*3/uL (ref 0–0.1)
Basophils Relative: 3 %
Eosinophils Absolute: 0.1 10*3/uL (ref 0–0.7)
Eosinophils Relative: 3 %
HEMATOCRIT: 36.3 % (ref 35.0–47.0)
HEMOGLOBIN: 12.6 g/dL (ref 12.0–16.0)
LYMPHS PCT: 16 %
Lymphs Abs: 0.4 10*3/uL — ABNORMAL LOW (ref 1.0–3.6)
MCH: 36.5 pg — ABNORMAL HIGH (ref 26.0–34.0)
MCHC: 34.7 g/dL (ref 32.0–36.0)
MCV: 105.4 fL — AB (ref 80.0–100.0)
MONO ABS: 0.2 10*3/uL (ref 0.2–0.9)
Monocytes Relative: 6 %
NEUTROS ABS: 2.1 10*3/uL (ref 1.4–6.5)
NEUTROS PCT: 72 %
Platelets: 221 10*3/uL (ref 150–440)
RBC: 3.44 MIL/uL — AB (ref 3.80–5.20)
RDW: 15.1 % — AB (ref 11.5–14.5)
WBC: 2.9 10*3/uL — ABNORMAL LOW (ref 3.6–11.0)

## 2015-11-10 LAB — COMPREHENSIVE METABOLIC PANEL
ALBUMIN: 4.2 g/dL (ref 3.5–5.0)
ALK PHOS: 38 U/L (ref 38–126)
ALT: 20 U/L (ref 14–54)
AST: 23 U/L (ref 15–41)
Anion gap: 6 (ref 5–15)
BILIRUBIN TOTAL: 0.8 mg/dL (ref 0.3–1.2)
BUN: 16 mg/dL (ref 6–20)
CALCIUM: 9 mg/dL (ref 8.9–10.3)
CO2: 27 mmol/L (ref 22–32)
CREATININE: 0.8 mg/dL (ref 0.44–1.00)
Chloride: 99 mmol/L — ABNORMAL LOW (ref 101–111)
GFR calc Af Amer: >60 mL/min (ref >60)
GLUCOSE: 133 mg/dL — AB (ref 65–99)
Potassium: 4 mmol/L (ref 3.5–5.1)
Sodium: 132 mmol/L — ABNORMAL LOW (ref 135–145)
TOTAL PROTEIN: 6.6 g/dL (ref 6.5–8.1)

## 2015-11-10 MED ORDER — FULVESTRANT 250 MG/5ML IM SOLN
500.0000 mg | Freq: Once | INTRAMUSCULAR | Status: AC
  Administered 2015-11-10: 500 mg via INTRAMUSCULAR
  Filled 2015-11-10: qty 10

## 2015-11-10 NOTE — Progress Notes (Signed)
No concerns today 

## 2015-11-10 NOTE — Assessment & Plan Note (Addendum)
Metastatic ER/PR positive HER-2/neu negative breast cancer currently on Faslodex /ibrance ;Today day # 10 day of her ibrance.   # Again, discussed treatments are indefinte- including Iv treatments in future. Pt not sure if she wants to do "IV chemo" but not completely against it either. Interested in "grant" for [employer].  Patient concerned about the cost of the treatment. We will reevaluate the disease status with CT scan prior to next visit.  #  White count 2.9/ labs- okay. Continue ibrance.  # Patient follow-up with me in approximately 1 month for her Faslodex/labs. CT C/A/P- prior.

## 2015-11-10 NOTE — Telephone Encounter (Signed)
Patient still has $3774 left on her grant which will last until Dec 23, 2015. She is not eligible to reapply for grant until December 04, 2015 per enrollment grant program.  Pt may call [phone] .  Option #1 and option #3 at that time.

## 2015-11-10 NOTE — Progress Notes (Signed)
[place] OFFICE PROGRESS NOTE  Patient Care Team: Lenard Simmer, MD as PCP - General (Endocrinology)  Cancer of breast [facility])   Staging form: Breast, AJCC 7th Edition     Clinical: Stage IV (T4, N1, M1) - Signed by Forest Gleason, MD on 01/20/2015    Oncology History   # June 2015-RIGHT BREAST CA ? 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  female pleasant patient above history of Metastatic ER/PR positive HER-2/neu negative breast cancer currently on ibrance plus Faslodex is here for follow-up.   Denies any pain. Denies any diarrhea. Denies any nausea vomiting. No shortness of breath or chest pain. No cough. She is fairly active. She is concerned about the financial/cause of the drugs including ibrance and Foslodex.   REVIEW OF SYSTEMS:  A complete 10 point review of system is done which is negative except mentioned above/history of present illness.   PAST MEDICAL HISTORY :  Past Medical History:  Diagnosis Date  . Arthritis   . Breast cancer ([place] 17+ years ago   Right Breast c radiation -- 2015 Mastectomy  . Cancer of breast ([place] 01/20/2015  . HOH (hard of hearing)    AIDS  . Hypertension     PAST SURGICAL HISTORY :   Past Surgical History:   Procedure Laterality Date  . ABDOMINAL HYSTERECTOMY    . BREAST LUMPECTOMY Right 1998   c Radiation  . CATARACT EXTRACTION W/PHACO Left 08/08/2015   Procedure: CATARACT EXTRACTION PHACO AND INTRAOCULAR LENS PLACEMENT (IOC);  Surgeon: CLeandrew Koyanagi MD;  Location: [facility] ORS;  Service: Ophthalmology;  Laterality: Left;  [place]58.0 AP% 14.9 CDE 8.60 Fluid Pack lot # [id number]  . JOINT REPLACEMENT Right 2008   knee  . MASTECTOMY Right 2015   After screening in May 2015 - Pt choice    FAMILY HISTORY :  History reviewed. No pertinent family history.  SOCIAL HISTORY:   Social History  Substance Use Topics  . Smoking status: Never Smoker  . Smokeless tobacco: Never Used  . Alcohol use No    ALLERGIES:  is allergic to tetracyclines & related.  MEDICATIONS:  Current Outpatient Prescriptions  Medication Sig Dispense Refill  . calcium carbonate (TUMS - DOSED IN MG ELEMENTAL CALCIUM) 500 MG chewable tablet     . lisinopril (PRINIVIL,ZESTRIL) 5 MG tablet Take by mouth.    . Melatonin 5 MG/15ML LIQD     . palbociclib (IBRANCE) 100 MG capsule Take 1 capsule (100 mg total) by mouth daily with breakfast. Take whole with food. Take for 3 weeks, then 1 week off. 21 capsule 3   No current facility-administered medications for this visit.  PHYSICAL EXAMINATION: ECOG PERFORMANCE STATUS: 0 - Asymptomatic  BP (!) 167/72 (BP Location: Left Arm, Patient Position: Sitting)   Pulse 76   Temp (!) 96.3 F (35.7 C) (Tympanic)   Resp 17   Ht _0  (1.651 m)   Wt 112 lb 12.8 oz (51.2 kg)   BMI 18.77 kg/m   Filed Weights   11/10/15 1442  Weight: 112 lb 12.8 oz (51.2 kg)    GENERAL: Well-nourished well-developed; Alert, no distress and comfortable.  Alone.  EYES: no pallor or icterus OROPHARYNX: no thrush or ulceration; good dentition  NECK: supple, right supraclavicular adenopathy felt.  LYMPH:  no palpable lymphadenopathy in the cervical, axillary or inguinal regions LUNGS: clear to  auscultation and  No wheeze or crackles HEART/CVS: regular rate & rhythm and no murmurs; No lower extremity edema ABDOMEN:abdomen soft, non-tender and normal bowel sounds Musculoskeletal:no cyanosis of digits and no clubbing  PSYCH: alert & oriented x 3 with fluent speech NEURO: no focal motor/sensory deficits SKIN:  no rashes or significant lesions  LABORATORY DATA:  I have reviewed the data as listed    Component Value Date/Time   NA 132 (L) 11/10/2015 1415   K 4.0 11/10/2015 1415   CL 99 (L) 11/10/2015 1415   CO2 27 11/10/2015 1415   GLUCOSE 133 (H) 11/10/2015 1415   BUN 16 11/10/2015 1415   CREATININE 0.80 11/10/2015 1415   CALCIUM 9.0 11/10/2015 1415   PROT 6.6 11/10/2015 1415   ALBUMIN 4.2 11/10/2015 1415   AST 23 11/10/2015 1415   ALT 20 11/10/2015 1415   ALKPHOS 38 11/10/2015 1415   BILITOT 0.8 11/10/2015 1415   GFRNONAA >60 11/10/2015 1415   GFRAA >60 11/10/2015 1415    No results found for: SPEP, UPEP  Lab Results  Component Value Date   WBC 2.9 (L) 11/10/2015   NEUTROABS 2.1 11/10/2015   HGB 12.6 11/10/2015   HCT 36.3 11/10/2015   MCV 105.4 (H) 11/10/2015   PLT 221 11/10/2015      Chemistry      Component Value Date/Time   NA 132 (L) 11/10/2015 1415   K 4.0 11/10/2015 1415   CL 99 (L) 11/10/2015 1415   CO2 27 11/10/2015 1415   BUN 16 11/10/2015 1415   CREATININE 0.80 11/10/2015 1415      Component Value Date/Time   CALCIUM 9.0 11/10/2015 1415   ALKPHOS 38 11/10/2015 1415   AST 23 11/10/2015 1415   ALT 20 11/10/2015 1415   BILITOT 0.8 11/10/2015 1415       RADIOGRAPHIC STUDIES: I have personally reviewed the radiological images as listed and agreed with the findings in the report. No results found.   ASSESSMENT & PLAN:  Malignant neoplasm of overlapping sites of right breast ([place]) Metastatic ER/PR positive HER-2/neu negative breast cancer currently on Faslodex /ibrance ;Today day # 10 day of her ibrance.   # Again, discussed treatments  are indefinte- including Iv treatments in future. Pt not sure if she wants to do "IV chemo" but not completely against it either. Interested in "grant" for [employer].   #  White count 2.9/ labs- okay.   # Patient follow-up with me in approximately 1 month for her Faslodex/labs. CT C/A/P- prior.    Orders Placed This Encounter  Procedures  . CT ABDOMEN PELVIS W CONTRAST    Standing Status:   Future    Standing Expiration Date:   02/08/2017    Order Specific Question:   Reason for Exam (  SYMPTOM  OR DIAGNOSIS REQUIRED)    Answer:   breast cancer metastatic    Order Specific Question:   Preferred imaging location?    Answer:   [facility]  . CT CHEST W CONTRAST    Standing Status:   Future    Standing Expiration Date:   01/09/2017    Order Specific Question:   Reason for Exam (SYMPTOM  OR DIAGNOSIS REQUIRED)    Answer:   breast cancer metastatic    Order Specific Question:   Preferred imaging location?    Answer:   [facility]   All questions were answered. The patient knows to call the clinic with any problems, questions or concerns.      Cammie Sickle, MD 11/10/2015 5:20 PM

## 2015-11-11 LAB — CANCER ANTIGEN 27.29: CA 27.29: 62.1 U/mL — ABNORMAL HIGH (ref 0.0–38.6)

## 2015-11-29 ENCOUNTER — Telehealth: Payer: Self-pay | Admitting: *Deleted

## 2015-11-29 ENCOUNTER — Ambulatory Visit: Admission: RE | Admit: 2015-11-29 | Payer: Medicare HMO | Source: Ambulatory Visit

## 2015-11-29 NOTE — Telephone Encounter (Signed)
Spoke with patient. She rcvd her forms to complete for the patient [employer]. States that these forms are very confusing and she doesn't understand what information is required. Pt expresses frustration in understanding terminology on the forms.   I explained that she needs proof of income- social sec. Checks, monthly income/bank statements to proove  How much she makes per year. This is to determine eligible to reapply for grant for her medication. Pt expresses that she doesn't understand what portion she has to sign on the form.  Per her request, I did provide her with the phone number for the pt. [employer] while on the phone. However, she stated that she would prefer that the RN sit down with her one on one to help her complete these required information.  I told her that I will be glad to help her with any of this information. However, I am in [place] clinic tomorrow. She asked if she could come at 830am tomorrow. She states that she will bring proof of income with her at that time.

## 2015-11-29 NOTE — Telephone Encounter (Signed)
Received a letter from co pay assistance that they sent request to [place] for more information and to her as well. She is getting her things together, and would someone return a call to her that we received something form co pay assistance and that we are working on it because it is due by 12/16/15

## 2015-12-05 ENCOUNTER — Ambulatory Visit
Admission: RE | Admit: 2015-12-05 | Discharge: 2015-12-05 | Disposition: A | Discharge status: Home / Self Care | Payer: Medicare HMO | Source: Ambulatory Visit | Attending: Internal Medicine | Admitting: Internal Medicine

## 2015-12-05 ENCOUNTER — Telehealth: Payer: Self-pay | Admitting: *Deleted

## 2015-12-05 ENCOUNTER — Other Ambulatory Visit: Payer: Self-pay

## 2015-12-05 DIAGNOSIS — C50011 Malignant neoplasm of nipple and areola, right female breast: Secondary | ICD-10-CM

## 2015-12-05 DIAGNOSIS — C7800 Secondary malignant neoplasm of unspecified lung: Secondary | ICD-10-CM | POA: Diagnosis not present

## 2015-12-05 DIAGNOSIS — C50811 Malignant neoplasm of overlapping sites of right female breast: Secondary | ICD-10-CM | POA: Insufficient documentation

## 2015-12-05 DIAGNOSIS — I700 Atherosclerosis of aorta: Secondary | ICD-10-CM | POA: Insufficient documentation

## 2015-12-05 MED ORDER — IOPAMIDOL (ISOVUE-300) INJECTION 61%
100.0000 mL | Freq: Once | INTRAVENOUS | Status: AC | PRN
  Administered 2015-12-05: 100 mL via INTRAVENOUS

## 2015-12-05 NOTE — Telephone Encounter (Signed)
Called to report she got 2 letters from co pay assistance that they did not receive the fax you sent with her information on it and asking for you to resend it using to barcode fax sheet to [phone]

## 2015-12-06 NOTE — Telephone Encounter (Signed)
Pt [employer] paperwork refaxed.

## 2015-12-08 ENCOUNTER — Encounter: Payer: Self-pay | Admitting: *Deleted

## 2015-12-08 ENCOUNTER — Inpatient Hospital Stay (HOSPITAL_BASED_OUTPATIENT_CLINIC_OR_DEPARTMENT_OTHER): Payer: Medicare HMO | Admitting: Internal Medicine

## 2015-12-08 ENCOUNTER — Inpatient Hospital Stay: Payer: Medicare HMO

## 2015-12-08 ENCOUNTER — Inpatient Hospital Stay: Payer: Medicare HMO | Attending: Internal Medicine

## 2015-12-08 ENCOUNTER — Other Ambulatory Visit: Payer: Self-pay

## 2015-12-08 VITALS — BP 146/68 | HR 82 | Temp 97.2°F | Resp 18 | Ht 65.0 in | Wt 111.8 lb

## 2015-12-08 DIAGNOSIS — C7801 Secondary malignant neoplasm of right lung: Secondary | ICD-10-CM | POA: Insufficient documentation

## 2015-12-08 DIAGNOSIS — I890 Lymphedema, not elsewhere classified: Secondary | ICD-10-CM | POA: Insufficient documentation

## 2015-12-08 DIAGNOSIS — C50811 Malignant neoplasm of overlapping sites of right female breast: Secondary | ICD-10-CM | POA: Insufficient documentation

## 2015-12-08 DIAGNOSIS — C50312 Malignant neoplasm of lower-inner quadrant of left female breast: Secondary | ICD-10-CM | POA: Insufficient documentation

## 2015-12-08 DIAGNOSIS — I2510 Atherosclerotic heart disease of native coronary artery without angina pectoris: Secondary | ICD-10-CM | POA: Insufficient documentation

## 2015-12-08 DIAGNOSIS — I10 Essential (primary) hypertension: Secondary | ICD-10-CM

## 2015-12-08 DIAGNOSIS — C7802 Secondary malignant neoplasm of left lung: Secondary | ICD-10-CM | POA: Diagnosis not present

## 2015-12-08 DIAGNOSIS — Z79811 Long term (current) use of aromatase inhibitors: Secondary | ICD-10-CM | POA: Diagnosis not present

## 2015-12-08 DIAGNOSIS — Z171 Estrogen receptor negative status [ER-]: Secondary | ICD-10-CM | POA: Insufficient documentation

## 2015-12-08 DIAGNOSIS — Z79899 Other long term (current) drug therapy: Secondary | ICD-10-CM

## 2015-12-08 DIAGNOSIS — C50011 Malignant neoplasm of nipple and areola, right female breast: Secondary | ICD-10-CM

## 2015-12-08 LAB — COMPREHENSIVE METABOLIC PANEL
ALK PHOS: 43 U/L (ref 38–126)
ALT: 21 U/L (ref 14–54)
AST: 26 U/L (ref 15–41)
Albumin: 4.3 g/dL (ref 3.5–5.0)
Anion gap: 9 (ref 5–15)
BUN: 19 mg/dL (ref 6–20)
CALCIUM: 9.4 mg/dL (ref 8.9–10.3)
CHLORIDE: 100 mmol/L — AB (ref 101–111)
CO2: 24 mmol/L (ref 22–32)
CREATININE: 0.85 mg/dL (ref 0.44–1.00)
Glucose, Bld: 123 mg/dL — ABNORMAL HIGH (ref 65–99)
Potassium: 4.1 mmol/L (ref 3.5–5.1)
Sodium: 133 mmol/L — ABNORMAL LOW (ref 135–145)
Total Bilirubin: 0.6 mg/dL (ref 0.3–1.2)
Total Protein: 7 g/dL (ref 6.5–8.1)

## 2015-12-08 LAB — CBC WITH DIFFERENTIAL/PLATELET
BASOS ABS: 0.1 10*3/uL (ref 0–0.1)
Basophils Relative: 3 %
Eosinophils Absolute: 0.1 10*3/uL (ref 0–0.7)
Eosinophils Relative: 4 %
HEMATOCRIT: 36.6 % (ref 35.0–47.0)
HEMOGLOBIN: 12.7 g/dL (ref 12.0–16.0)
LYMPHS ABS: 0.5 10*3/uL — AB (ref 1.0–3.6)
LYMPHS PCT: 14 %
MCH: 36.5 pg — AB (ref 26.0–34.0)
MCHC: 34.8 g/dL (ref 32.0–36.0)
MCV: 104.9 fL — AB (ref 80.0–100.0)
Monocytes Absolute: 0.2 10*3/uL (ref 0.2–0.9)
Monocytes Relative: 6 %
NEUTROS ABS: 2.4 10*3/uL (ref 1.4–6.5)
NEUTROS PCT: 73 %
PLATELETS: 242 10*3/uL (ref 150–440)
RBC: 3.49 MIL/uL — AB (ref 3.80–5.20)
RDW: 14.6 % — ABNORMAL HIGH (ref 11.5–14.5)
WBC: 3.3 10*3/uL — AB (ref 3.6–11.0)

## 2015-12-08 MED ORDER — FULVESTRANT 250 MG/5ML IM SOLN
500.0000 mg | Freq: Once | INTRAMUSCULAR | Status: AC
  Administered 2015-12-08: 500 mg via INTRAMUSCULAR
  Filled 2015-12-08: qty 10

## 2015-12-08 NOTE — Progress Notes (Signed)
[name]  Patient Care Team: Hayley Simmer, MD as PCP - General (Endocrinology)  Cancer of breast [facility])   Staging form: Breast, AJCC 7th Edition     Clinical: Stage IV (T4, N1, M1) - Signed by Hayley Gleason, MD on 01/20/2015    Oncology History   # June 2015-RIGHT BREAST CA ? Recurrent- IMC [nipple inverted; T4-skin invol; s/p Mastec; No LN; Dr.Smith ]; ER/PR- Pos 51-90%;Her 2 neu-NEG;Declined RT/Anti-hormone;   #  STAGE IV- Nov 2016- IMC ER-90%; PR-51-90%; her 2 neu-NEG [right ax LN Bx] NOV 2016- IBRANCE + Letrozole; April 2017- PET- RLL/LLL- progression; Supraclav/Ax LN- STABLE/TM- rising;June 20th 2017 FASLODEX+ Ibrance 14m; OCT 2nd- CT C/A/P- STABLE pul mets.   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 [[facility] s/p lumpec;s/p Tam x9 M RT]  # 2015- R UE Lymphedema on Sleeve     Cancer of breast ([place]   01/20/2015 Initial Diagnosis    [place]cer of breast ([place]       Malignant neoplasm of overlapping sites of right breast ([place]   09/12/2015 Initial Diagnosis    Malignant neoplasm of overlapping sites of right breast ([facility]       Malignant neoplasm of lower-inner quadrant of left breast in female, estrogen receptor negative ([place]    Initial Diagnosis    Malignant neoplasm of lower-inner quadrant of left breast in female, estrogen receptor negative ([place]        INTERVAL HISTORY:   [name] pleasant patient above history of Metastatic ER/PR positive HER-2/neu negative breast cancer currently on ibrance plus Faslodex is here for follow-up/ review the CT scans.  Complains of tightness in her hands especially the mornings. Also the feet. Denies any tingling and numbness. Denies any pain. Denies any diarrhea. Denies any nausea vomiting. No shortness of breath or chest pain. No cough. She is fairly active. Denies any hot flashes.  REVIEW OF SYSTEMS:  A complete 10 point review of system is done which is negative except mentioned  above/history of present illness.   PAST MEDICAL HISTORY :  Past Medical History:  Diagnosis Date  . Arthritis   . Breast cancer ([facility] 17+ years ago   Right Breast c radiation -- 2015 Mastectomy  . Cancer of breast ([place] 01/20/2015  . HOH (hard of hearing)    AIDS  . Hypertension   . Malignant neoplasm of lower-inner quadrant of left breast in female, estrogen receptor negative ([place]     PAST SURGICAL HISTORY :   Past Surgical History:  Procedure Laterality Date  . ABDOMINAL HYSTERECTOMY    . BREAST LUMPECTOMY Right 1998   c Radiation  . CATARACT EXTRACTION W/PHACO Left 08/08/2015   Procedure: CATARACT EXTRACTION PHACO AND INTRAOCULAR LENS PLACEMENT (IOC);  Surgeon: CLeandrew Koyanagi MD;  Location: [facility] ORS;  Service: Ophthalmology;  Laterality: Left;  [place]58.[place] AP% 14.9 CDE 8.60 Fluid Pack lot # [id number]  . JOINT REPLACEMENT Right 2008   knee  . MASTECTOMY Right 2015   After screening in May 2015 - Pt choice    FAMILY HISTORY :  History reviewed. No pertinent family history.  SOCIAL HISTORY:   Social History  Substance Use Topics  . Smoking status: Never Smoker  . Smokeless tobacco: Never Used  . Alcohol use No    ALLERGIES:  is allergic to tetracyclines & related.  MEDICATIONS:  Current Outpatient Prescriptions  Medication Sig Dispense Refill  . calcium carbonate (TUMS - DOSED IN  MG ELEMENTAL CALCIUM) 500 MG chewable tablet     . lisinopril (PRINIVIL,ZESTRIL) 5 MG tablet Take by mouth.    . Melatonin 5 MG/15ML LIQD     . palbociclib (IBRANCE) 100 MG capsule Take 1 capsule (100 mg total) by mouth daily with breakfast. Take whole with food. Take for 3 weeks, then 1 week off. 21 capsule 3   No current facility-administered medications for this visit.     PHYSICAL EXAMINATION: ECOG PERFORMANCE STATUS: 0 - Asymptomatic  BP (!) 146/68 (BP Location: Left Arm, Patient Position: Sitting)   Pulse 82   Temp 97.2 F (36.2 C) (Tympanic)   Resp 18   Ht _0  (1.651  m)   Wt 111 lb 12.8 oz (50.7 kg)   BMI 18.60 kg/m   Filed Weights   12/08/15 1523  Weight: 111 lb 12.8 oz (50.7 kg)    GENERAL: Well-nourished well-developed; Alert, no distress and comfortable.  Alone.  EYES: no pallor or icterus OROPHARYNX: no thrush or ulceration; good dentition  NECK: supple, right supraclavicular adenopathy felt.  LYMPH:  no palpable lymphadenopathy in the cervical, axillary or inguinal regions LUNGS: clear to auscultation and  No wheeze or crackles HEART/CVS: regular rate & rhythm and no murmurs; No lower extremity edema ABDOMEN:abdomen soft, non-tender and normal bowel sounds Musculoskeletal:no cyanosis of digits and no clubbing  PSYCH: alert & oriented x 3 with fluent speech NEURO: no focal motor/sensory deficits SKIN:  no rashes or significant lesions  LABORATORY DATA:  I have reviewed the data as listed    Component Value Date/Time   NA 133 (L) 12/08/2015 1449   K 4.1 12/08/2015 1449   CL 100 (L) 12/08/2015 1449   CO2 24 12/08/2015 1449   GLUCOSE 123 (H) 12/08/2015 1449   BUN 19 12/08/2015 1449   CREATININE 0.85 12/08/2015 1449   CALCIUM 9.4 12/08/2015 1449   PROT 7.0 12/08/2015 1449   ALBUMIN 4.3 12/08/2015 1449   AST 26 12/08/2015 1449   ALT 21 12/08/2015 1449   ALKPHOS 43 12/08/2015 1449   BILITOT 0.6 12/08/2015 1449   GFRNONAA >60 12/08/2015 1449   GFRAA >60 12/08/2015 1449    No results found for: SPEP, UPEP  Lab Results  Component Value Date   WBC 3.3 (L) 12/08/2015   NEUTROABS 2.4 12/08/2015   HGB 12.7 12/08/2015   HCT 36.6 12/08/2015   MCV 104.9 (H) 12/08/2015   PLT 242 12/08/2015      Chemistry      Component Value Date/Time   NA 133 (L) 12/08/2015 1449   K 4.1 12/08/2015 1449   CL 100 (L) 12/08/2015 1449   CO2 24 12/08/2015 1449   BUN 19 12/08/2015 1449   CREATININE 0.85 12/08/2015 1449      Component Value Date/Time   CALCIUM 9.4 12/08/2015 1449   ALKPHOS 43 12/08/2015 1449   AST 26 12/08/2015 1449   ALT 21  12/08/2015 1449   BILITOT 0.6 12/08/2015 1449     IMPRESSION: 1. Stable appearance of bilateral lower lobe pulmonary metastases. 2. Decrease in size of right axillary nodal metastasis. The right supraclavicular and prevascular index nodes are no longer measurable. 3. Aortic atherosclerosis   Electronically Signed   By: Kerby Moors M.D.   On: 12/05/2015 10:33  RADIOGRAPHIC STUDIES: I have personally reviewed the radiological images as listed and agreed with the findings in the report. No results found.   ASSESSMENT & PLAN:  Malignant neoplasm of lower-inner quadrant of left breast in  female, estrogen receptor negative ([place]) Metastatic ER/PR positive HER-2/neu negative breast cancer currently on Faslodex /ibrance- CT OCT 4th CT- stable bil pul nodules; improved right Ax.  # #  White count 3.3/ labs- okay. Continue ibrance; day #9 today.   # cataract surgery- on 25th. HOLD ibrance after 14th in anticipation of surgery; not to re-start until evaluated by me in 4 weeks.   # labs/shots/ 4 weeks.    Orders Placed This Encounter  Procedures  . CBC with Differential    Standing Status:   Future    Standing Expiration Date:   12/07/2016  . Comprehensive metabolic panel    Standing Status:   Future    Standing Expiration Date:   12/07/2016   # I reviewed the blood work- with the patient in detail; also reviewed the imaging independently [as summarized above]; and with the patient in detail.       Cammie Sickle, MD 12/08/2015 4:52 PM

## 2015-12-08 NOTE — Assessment & Plan Note (Signed)
Metastatic ER/PR positive HER-2/neu negative breast cancer currently on Faslodex /ibrance- CT OCT 4th CT- stable bil pul nodules; improved right Ax.  # #  White count 3.3/ labs- okay. Continue ibrance; day #9 today.   # cataract surgery- on 25th. HOLD ibrance after 14th in anticipation of surgery; not to re-start until evaluated by me in 4 weeks.   # labs/shots/ 4 weeks.

## 2015-12-08 NOTE — Progress Notes (Signed)
Ct last Monday.  Pt concerned that her finger feel like they are locking on her and the neuropathy in her feet.  Pt wants to know if its ok to do her cataract surgery in 2 weeks?

## 2015-12-09 LAB — CANCER ANTIGEN 27.29: CA 27.29: 77.1 U/mL — ABNORMAL HIGH (ref 0.0–38.6)

## 2015-12-20 NOTE — Discharge Instructions (Signed)

## 2015-12-21 ENCOUNTER — Encounter: Payer: Self-pay | Admitting: *Deleted

## 2015-12-26 ENCOUNTER — Telehealth: Payer: Self-pay | Admitting: *Deleted

## 2015-12-26 ENCOUNTER — Encounter: Payer: Self-pay | Admitting: *Deleted

## 2015-12-26 NOTE — Telephone Encounter (Signed)
Letter faxed to [facility]

## 2015-12-26 NOTE — Telephone Encounter (Signed)
Needs clearance for Cataract surgery scheduled for Wednesday 10/25. States papers have been sent with no response.Please call her

## 2015-12-28 ENCOUNTER — Ambulatory Visit: Payer: Medicare HMO | Admitting: Anesthesiology

## 2015-12-28 ENCOUNTER — Ambulatory Visit
Admission: RE | Admit: 2015-12-28 | Discharge: 2015-12-28 | Disposition: A | Discharge status: Home / Self Care | Payer: Medicare HMO | Source: Ambulatory Visit | Attending: Ophthalmology | Admitting: Ophthalmology

## 2015-12-28 ENCOUNTER — Encounter
Admission: RE | Disposition: A | Discharge status: Home / Self Care | Payer: Self-pay | Source: Ambulatory Visit | Attending: Ophthalmology

## 2015-12-28 DIAGNOSIS — Z853 Personal history of malignant neoplasm of breast: Secondary | ICD-10-CM | POA: Insufficient documentation

## 2015-12-28 DIAGNOSIS — Z9011 Acquired absence of right breast and nipple: Secondary | ICD-10-CM | POA: Diagnosis not present

## 2015-12-28 DIAGNOSIS — Z79899 Other long term (current) drug therapy: Secondary | ICD-10-CM | POA: Insufficient documentation

## 2015-12-28 DIAGNOSIS — Z96651 Presence of right artificial knee joint: Secondary | ICD-10-CM | POA: Diagnosis not present

## 2015-12-28 DIAGNOSIS — I10 Essential (primary) hypertension: Secondary | ICD-10-CM | POA: Insufficient documentation

## 2015-12-28 DIAGNOSIS — H2511 Age-related nuclear cataract, right eye: Secondary | ICD-10-CM | POA: Diagnosis present

## 2015-12-28 HISTORY — PX: CATARACT EXTRACTION W/PHACO: SHX586

## 2015-12-28 SURGERY — PHACOEMULSIFICATION, CATARACT, WITH IOL INSERTION
Anesthesia: Monitor Anesthesia Care | Laterality: Right | Wound class: Clean

## 2015-12-28 MED ORDER — EPINEPHRINE PF 1 MG/ML IJ SOLN
INTRAMUSCULAR | Status: DC | PRN
  Administered 2015-12-28: 65 mL via OPHTHALMIC

## 2015-12-28 MED ORDER — TIMOLOL MALEATE 0.5 % OP SOLN
OPHTHALMIC | Status: DC | PRN
  Administered 2015-12-28: 1 [drp] via OPHTHALMIC

## 2015-12-28 MED ORDER — MOXIFLOXACIN HCL 0.5 % OP SOLN
1.0000 [drp] | OPHTHALMIC | Status: DC | PRN
  Administered 2015-12-28 (×3): 1 [drp] via OPHTHALMIC

## 2015-12-28 MED ORDER — NA HYALUR & NA CHOND-NA HYALUR 0.4-0.35 ML IO KIT
PACK | INTRAOCULAR | Status: DC | PRN
  Administered 2015-12-28: 1 mL via INTRAOCULAR

## 2015-12-28 MED ORDER — LACTATED RINGERS IV SOLN: INTRAVENOUS | Status: DC

## 2015-12-28 MED ORDER — CEFUROXIME OPHTHALMIC INJECTION 1 MG/0.1 ML
INJECTION | OPHTHALMIC | Status: DC | PRN
Start: 2015-12-28 — End: 2015-12-28
  Administered 2015-12-28: 0.1 mL via INTRACAMERAL

## 2015-12-28 MED ORDER — MIDAZOLAM HCL 2 MG/2ML IJ SOLN
INTRAMUSCULAR | Status: DC | PRN
  Administered 2015-12-28 (×2): 1 mg via INTRAVENOUS

## 2015-12-28 MED ORDER — ARMC OPHTHALMIC DILATING DROPS
1.0000 "application " | OPHTHALMIC | Status: DC | PRN
  Administered 2015-12-28 (×3): 1 via OPHTHALMIC

## 2015-12-28 MED ORDER — LIDOCAINE HCL (PF) 4 % IJ SOLN
INTRAOCULAR | Status: DC | PRN
  Administered 2015-12-28: 1 mL via OPHTHALMIC

## 2015-12-28 MED ORDER — FENTANYL CITRATE (PF) 100 MCG/2ML IJ SOLN
INTRAMUSCULAR | Status: DC | PRN
  Administered 2015-12-28 (×2): 50 ug via INTRAVENOUS

## 2015-12-28 MED ORDER — BRIMONIDINE TARTRATE 0.2 % OP SOLN
OPHTHALMIC | Status: DC | PRN
  Administered 2015-12-28: 1 [drp] via OPHTHALMIC

## 2015-12-28 SURGICAL SUPPLY — 27 items
CANNULA ANT/CHMB 27G (MISCELLANEOUS) ×1 IMPLANT
CANNULA ANT/CHMB 27GA (MISCELLANEOUS) ×3 IMPLANT
CARTRIDGE ABBOTT (MISCELLANEOUS) IMPLANT
GLOVE SURG LX 7.5 STRW (GLOVE) ×2
GLOVE SURG LX STRL 7.5 STRW (GLOVE) ×1 IMPLANT
GLOVE SURG TRIUMPH 8.0 PF LTX (GLOVE) ×3 IMPLANT
GOWN STRL REUS W/ TWL LRG LVL3 (GOWN DISPOSABLE) ×2 IMPLANT
GOWN STRL REUS W/TWL LRG LVL3 (GOWN DISPOSABLE) ×6
LENS IOL TECNIS ITEC 22.5 (Intraocular Lens) ×2 IMPLANT
MARKER SKIN DUAL TIP RULER LAB (MISCELLANEOUS) ×3 IMPLANT
NDL FILTER BLUNT 18X1 1/2 (NEEDLE) ×1 IMPLANT
NDL RETROBULBAR .5 NSTRL (NEEDLE) IMPLANT
NEEDLE FILTER BLUNT 18X 1/2SAF (NEEDLE) ×2
NEEDLE FILTER BLUNT 18X1 1/2 (NEEDLE) ×1 IMPLANT
PACK CATARACT BRASINGTON (MISCELLANEOUS) ×3 IMPLANT
PACK EYE AFTER SURG (MISCELLANEOUS) ×3 IMPLANT
PACK OPTHALMIC (MISCELLANEOUS) ×3 IMPLANT
RING MALYGIN 7.0 (MISCELLANEOUS) IMPLANT
SUT ETHILON 10-0 CS-B-6CS-B-6 (SUTURE)
SUT VICRYL  9 0 (SUTURE)
SUT VICRYL 9 0 (SUTURE) IMPLANT
SUTURE EHLN 10-0 CS-B-6CS-B-6 (SUTURE) IMPLANT
SYR 3ML LL SCALE MARK (SYRINGE) ×3 IMPLANT
SYR 5ML LL (SYRINGE) ×3 IMPLANT
SYR TB 1ML LUER SLIP (SYRINGE) ×3 IMPLANT
WATER STERILE IRR 250ML POUR (IV SOLUTION) ×3 IMPLANT
WIPE NON LINTING 3.25X3.25 (MISCELLANEOUS) ×3 IMPLANT

## 2015-12-28 NOTE — H&P (Signed)
The History and Physical notes are on paper, have been signed, and are to be scanned. The patient remains stable and unchanged from the H&P.   Previous H&P reviewed, patient examined, and there are no changes.  [name] 12/28/2015 8:36 AM

## 2015-12-28 NOTE — Anesthesia Procedure Notes (Signed)
Procedure Name: MAC Date/Time: 12/28/2015 8:50 AM Performed by: Cameron Ali Pre-anesthesia Checklist: Patient identified, Emergency Drugs available, Suction available, Timeout performed and Patient being monitored Patient Re-evaluated:Patient Re-evaluated prior to inductionOxygen Delivery Method: Nasal cannula Placement Confirmation: positive ETCO2

## 2015-12-28 NOTE — Anesthesia Postprocedure Evaluation (Signed)
Anesthesia Post Note  Patient: Hayley [name]  Procedure(s) Performed: Procedure(s) (LRB): CATARACT EXTRACTION PHACO AND INTRAOCULAR LENS PLACEMENT (IOC) (Right)  Patient location during evaluation: PACU Anesthesia Type: MAC Level of consciousness: awake and alert and oriented Pain management: satisfactory to patient Vital Signs Assessment: post-procedure vital signs reviewed and stable Respiratory status: spontaneous breathing, nonlabored ventilation and respiratory function stable Cardiovascular status: blood pressure returned to baseline and stable Postop Assessment: Adequate PO intake and No signs of nausea or vomiting Anesthetic complications: no    Raliegh Ip

## 2015-12-28 NOTE — Op Note (Signed)
LOCATION:  [place]   PREOPERATIVE DIAGNOSIS:    Nuclear sclerotic cataract right eye. H25.11   POSTOPERATIVE DIAGNOSIS:  Nuclear sclerotic cataract right eye.     PROCEDURE:  Phacoemusification with posterior chamber intraocular lens placement of the right eye   LENS:   Implant Name Type Inv. Item Serial No. Manufacturer Lot No. LRB No. Used  LENS IOL DIOP 22.5 - [id number] Intraocular Lens LENS IOL DIOP 22.5 [id number]   Right 1        ULTRASOUND TIME: 16 % of 1 minutes, 20 seconds.  CDE 13.4   SURGEON:  Wyonia Hough, MD   ANESTHESIA:  Topical with tetracaine drops and 2% Xylocaine jelly, augmented with 1% preservative-free intracameral lidocaine.    COMPLICATIONS:  None.   DESCRIPTION OF PROCEDURE:  The patient was identified in the holding room and transported to the operating room and placed in the supine position under the operating microscope.  The right eye was identified as the operative eye and it was prepped and draped in the usual sterile ophthalmic fashion.   A 1 millimeter clear-corneal paracentesis was made at the 12:00 position.  0.5 ml of preservative-free 1% lidocaine was injected into the anterior chamber. The anterior chamber was filled with Viscoat viscoelastic.  A 2.4 millimeter keratome was used to make a near-clear corneal incision at the 9:00 position.  A curvilinear capsulorrhexis was made with a cystotome and capsulorrhexis forceps.  Balanced salt solution was used to hydrodissect and hydrodelineate the nucleus.   Phacoemulsification was then used in stop and chop fashion to remove the lens nucleus and epinucleus.  The remaining cortex was then removed using the irrigation and aspiration handpiece. Provisc was then placed into the capsular bag to distend it for lens placement.  A lens was then injected into the capsular bag.  The remaining viscoelastic was aspirated.   Wounds were hydrated with balanced salt solution.  The anterior  chamber was inflated to a physiologic pressure with balanced salt solution.  No wound leaks were noted. Cefuroxime 0.1 ml of a 10mg /ml solution was injected into the anterior chamber for a dose of 1 mg of intracameral antibiotic at the completion of the case.   Timolol and Brimonidine drops were applied to the eye.  The patient was taken to the recovery room in stable condition without complications of anesthesia or surgery.   Minnah Llamas 12/28/2015, 9:09 AM

## 2015-12-28 NOTE — Transfer of Care (Signed)
Immediate Anesthesia Transfer of Care Note  Patient: Hayley [name]  Procedure(s) Performed: Procedure(s) with comments: CATARACT EXTRACTION PHACO AND INTRAOCULAR LENS PLACEMENT (IOC) (Right) - requests early  Patient Location: PACU  Anesthesia Type: MAC  Level of Consciousness: awake, alert  and patient cooperative  Airway and Oxygen Therapy: Patient Spontanous Breathing and Patient connected to supplemental oxygen  Post-op Assessment: Post-op Vital signs reviewed, Patient's Cardiovascular Status Stable, Respiratory Function Stable, Patent Airway and No signs of Nausea or vomiting  Post-op Vital Signs: Reviewed and stable  Complications: No apparent anesthesia complications

## 2015-12-28 NOTE — Anesthesia Preprocedure Evaluation (Signed)
Anesthesia Evaluation  Patient identified by MRN, date of birth, ID band Patient awake    Reviewed: Allergy & Precautions, H&P , NPO status , Patient's Chart, lab work & pertinent test results  Airway Mallampati: II  TM Distance: >3 FB Neck ROM: full    Dental no notable dental hx.    Pulmonary    Pulmonary exam normal        Cardiovascular hypertension, Normal cardiovascular exam     Neuro/Psych    GI/Hepatic   Endo/Other    Renal/GU      Musculoskeletal   Abdominal   Peds  Hematology   Anesthesia Other Findings   Reproductive/Obstetrics                             Anesthesia Physical Anesthesia Plan  ASA: II  Anesthesia Plan: MAC   Post-op Pain Management:    Induction:   Airway Management Planned:   Additional Equipment:   Intra-op Plan:   Post-operative Plan:   Informed Consent: I have reviewed the patients History and Physical, chart, labs and discussed the procedure including the risks, benefits and alternatives for the proposed anesthesia with the patient or authorized representative who has indicated his/her understanding and acceptance.     Plan Discussed with:   Anesthesia Plan Comments:         Anesthesia Quick Evaluation  

## 2015-12-29 ENCOUNTER — Encounter: Payer: Self-pay | Admitting: Ophthalmology

## 2016-01-05 ENCOUNTER — Inpatient Hospital Stay (HOSPITAL_BASED_OUTPATIENT_CLINIC_OR_DEPARTMENT_OTHER): Payer: Medicare HMO | Admitting: Internal Medicine

## 2016-01-05 ENCOUNTER — Inpatient Hospital Stay: Payer: Medicare HMO | Attending: Internal Medicine

## 2016-01-05 ENCOUNTER — Inpatient Hospital Stay: Payer: Medicare HMO

## 2016-01-05 DIAGNOSIS — C773 Secondary and unspecified malignant neoplasm of axilla and upper limb lymph nodes: Secondary | ICD-10-CM | POA: Diagnosis not present

## 2016-01-05 DIAGNOSIS — C50312 Malignant neoplasm of lower-inner quadrant of left female breast: Secondary | ICD-10-CM | POA: Diagnosis not present

## 2016-01-05 DIAGNOSIS — M1990 Unspecified osteoarthritis, unspecified site: Secondary | ICD-10-CM | POA: Diagnosis not present

## 2016-01-05 DIAGNOSIS — Z9011 Acquired absence of right breast and nipple: Secondary | ICD-10-CM | POA: Diagnosis not present

## 2016-01-05 DIAGNOSIS — B20 Human immunodeficiency virus [HIV] disease: Secondary | ICD-10-CM | POA: Insufficient documentation

## 2016-01-05 DIAGNOSIS — Z79899 Other long term (current) drug therapy: Secondary | ICD-10-CM | POA: Diagnosis not present

## 2016-01-05 DIAGNOSIS — Z170 Estrogen receptor positive status [ER+]: Secondary | ICD-10-CM

## 2016-01-05 DIAGNOSIS — Z86000 Personal history of in-situ neoplasm of breast: Secondary | ICD-10-CM | POA: Insufficient documentation

## 2016-01-05 DIAGNOSIS — Z171 Estrogen receptor negative status [ER-]: Secondary | ICD-10-CM

## 2016-01-05 DIAGNOSIS — R221 Localized swelling, mass and lump, neck: Secondary | ICD-10-CM | POA: Diagnosis not present

## 2016-01-05 DIAGNOSIS — C50811 Malignant neoplasm of overlapping sites of right female breast: Secondary | ICD-10-CM

## 2016-01-05 DIAGNOSIS — I10 Essential (primary) hypertension: Secondary | ICD-10-CM | POA: Diagnosis not present

## 2016-01-05 DIAGNOSIS — I700 Atherosclerosis of aorta: Secondary | ICD-10-CM

## 2016-01-05 DIAGNOSIS — C7801 Secondary malignant neoplasm of right lung: Secondary | ICD-10-CM

## 2016-01-05 DIAGNOSIS — Z9223 Personal history of estrogen therapy: Secondary | ICD-10-CM | POA: Insufficient documentation

## 2016-01-05 DIAGNOSIS — I890 Lymphedema, not elsewhere classified: Secondary | ICD-10-CM | POA: Diagnosis not present

## 2016-01-05 DIAGNOSIS — Z79818 Long term (current) use of other agents affecting estrogen receptors and estrogen levels: Secondary | ICD-10-CM | POA: Insufficient documentation

## 2016-01-05 DIAGNOSIS — M25511 Pain in right shoulder: Secondary | ICD-10-CM

## 2016-01-05 DIAGNOSIS — C7802 Secondary malignant neoplasm of left lung: Secondary | ICD-10-CM | POA: Insufficient documentation

## 2016-01-05 DIAGNOSIS — Z23 Encounter for immunization: Secondary | ICD-10-CM | POA: Insufficient documentation

## 2016-01-05 LAB — COMPREHENSIVE METABOLIC PANEL
ALBUMIN: 4.6 g/dL (ref 3.5–5.0)
ALT: 22 U/L (ref 14–54)
AST: 23 U/L (ref 15–41)
Alkaline Phosphatase: 42 U/L (ref 38–126)
Anion gap: 5 (ref 5–15)
BUN: 16 mg/dL (ref 6–20)
CHLORIDE: 101 mmol/L (ref 101–111)
CO2: 28 mmol/L (ref 22–32)
CREATININE: 0.69 mg/dL (ref 0.44–1.00)
Calcium: 9 mg/dL (ref 8.9–10.3)
GFR calc non Af Amer: >60 mL/min (ref >60)
Glucose, Bld: 92 mg/dL (ref 65–99)
Potassium: 4.1 mmol/L (ref 3.5–5.1)
SODIUM: 134 mmol/L — AB (ref 135–145)
Total Bilirubin: 0.8 mg/dL (ref 0.3–1.2)
Total Protein: 7 g/dL (ref 6.5–8.1)

## 2016-01-05 LAB — CBC WITH DIFFERENTIAL/PLATELET
BASOS ABS: 0.1 10*3/uL (ref 0–0.1)
BASOS PCT: 3 %
EOS ABS: 0.1 10*3/uL (ref 0–0.7)
EOS PCT: 3 %
HCT: 39.9 % (ref 35.0–47.0)
Hemoglobin: 13.8 g/dL (ref 12.0–16.0)
Lymphocytes Relative: 17 %
Lymphs Abs: 0.8 10*3/uL — ABNORMAL LOW (ref 1.0–3.6)
MCH: 36.3 pg — AB (ref 26.0–34.0)
MCHC: 34.6 g/dL (ref 32.0–36.0)
MCV: 104.6 fL — ABNORMAL HIGH (ref 80.0–100.0)
Monocytes Absolute: 0.8 10*3/uL (ref 0.2–0.9)
Monocytes Relative: 16 %
Neutro Abs: 2.9 10*3/uL (ref 1.4–6.5)
Neutrophils Relative %: 61 %
PLATELETS: 241 10*3/uL (ref 150–440)
RBC: 3.81 MIL/uL (ref 3.80–5.20)
RDW: 14.7 % — ABNORMAL HIGH (ref 11.5–14.5)
WBC: 4.7 10*3/uL (ref 3.6–11.0)

## 2016-01-05 MED ORDER — FULVESTRANT 250 MG/5ML IM SOLN
500.0000 mg | Freq: Once | INTRAMUSCULAR | Status: AC
  Administered 2016-01-05: 500 mg via INTRAMUSCULAR
  Filled 2016-01-05: qty 10

## 2016-01-05 MED ORDER — HYDROCODONE-ACETAMINOPHEN 5-325 MG PO TABS: 1.0000 | ORAL_TABLET | Freq: Four times a day (QID) | ORAL | 0 refills | Status: DC | PRN

## 2016-01-05 NOTE — Progress Notes (Signed)
Patient is here for follow up, she mentions some right back pain that goes into her right arm. The fingers on her right hand are tingling and going numb.

## 2016-01-05 NOTE — Progress Notes (Signed)
[place] OFFICE PROGRESS NOTE  Patient Care Team: Lenard Simmer, MD as PCP - General (Endocrinology)  Cancer of breast [facility])   Staging form: Breast, AJCC 7th Edition     Clinical: Stage IV (T4, N1, M1) - Signed by Forest Gleason, MD on 01/20/2015    Oncology History   # June 2015-RIGHT BREAST CA ? Recurrent- IMC [nipple inverted; T4-skin invol; s/p Mastec; No LN; Dr.Smith ]; ER/PR- Pos 51-90%;Her 2 neu-NEG;Declined RT/Anti-hormone;   #  STAGE IV- Nov 2016- IMC ER-90%; PR-51-90%; her 2 neu-NEG [right ax LN Bx] NOV 2016- IBRANCE + Letrozole; April 2017- PET- RLL/LLL- progression; Supraclav/Ax LN- STABLE/TM- rising;June 20th 2017 FASLODEX+ Ibrance 150m; OCT 2nd- CT C/A/P- STABLE pul mets.   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       Malignant neoplasm of overlapping sites of right breast ([place]   09/12/2015 Initial Diagnosis    Malignant neoplasm of [facility] sites of right breast ([facility]       Malignant neoplasm of lower-inner quadrant of left breast in female, estrogen receptor negative ([place]    Initial Diagnosis    Malignant neoplasm of lower-inner quadrant of left breast in female, estrogen receptor negative ([place]      Carcinoma of lower-inner quadrant of left breast in female, estrogen receptor positive ([place]   01/05/2016 Initial Diagnosis    Carcinoma of lower-inner quadrant of left breast in female, estrogen receptor positive ([place]        INTERVAL HISTORY:   Hayley Don89y.o.  female pleasant patient above history of Metastatic ER/PR positive HER-2/neu negative breast cancer currently on ibrance plus Faslodex is here for follow-.  In the interim patient had a cataract surgery uneventfully.  Patient complains of pain in the right shoulder area; not worse with movement. She also noted to have a fullness  in the right supra-clavicular region. The pain is fairly intense; not helped by Tylenol.  Denies any tingling and numbness. Denies any pain. Denies any diarrhea. Denies any nausea vomiting. No shortness of breath or chest pain. No cough. She is fairly active. Denies any hot flashes.  REVIEW OF SYSTEMS:  A complete 10 point review of system is done which is negative except mentioned above/history of present illness.   PAST MEDICAL HISTORY :  Past Medical History:  Diagnosis Date  . Arthritis   . Breast cancer ([place] 17[place] years ago   Right Breast c radiation -- 2015 Mastectomy  . Cancer of breast (HS[place] 01/20/2015  . HOH (hard of hearing)    AIDS  . Hypertension   . Malignant neoplasm of lower-inner quadrant of left breast in female, estrogen receptor negative ([place]     PAST SURGICAL HISTORY :   Past Surgical History:  Procedure Laterality Date  . ABDOMINAL HYSTERECTOMY    . BREAST LUMPECTOMY Right 1998   c Radiation  . CATARACT EXTRACTION W/PHACO Left 08/08/2015   Procedure: CATARACT EXTRACTION PHACO AND INTRAOCULAR LENS PLACEMENT (IOC);  Surgeon: CLeandrew Koyanagi MD;  Location: [facility] ORS;  Service: Ophthalmology;  Laterality: Left;  [place]58.0 AP% 14.9 CDE 8.60 Fluid Pack lot # [id number]  . CATARACT EXTRACTION W/PHACO Right 12/28/2015   Procedure: CATARACT EXTRACTION PHACO AND INTRAOCULAR LENS PLACEMENT (IOC);  Surgeon: CLeandrew Koyanagi MD;  [facility];  Service: Ophthalmology;  Laterality: Right;  requests early  . JOINT REPLACEMENT Right 2008   knee  . MASTECTOMY Right 2015   After screening in May 2015 - Pt choice    FAMILY HISTORY :  No family history on file.  SOCIAL HISTORY:   Social History  Substance Use Topics  . Smoking status: Never Smoker  . Smokeless tobacco: Never Used  . Alcohol use No    ALLERGIES:  is allergic to tetracyclines & related.  MEDICATIONS:  Current Outpatient Prescriptions  Medication Sig Dispense Refill  . ALPHA  LIPOIC ACID PO Take by mouth daily.    . calcium carbonate (TUMS - DOSED IN MG ELEMENTAL CALCIUM) 500 MG chewable tablet     . Cholecalciferol (VITAMIN D3 PO) Take 4,000 Units by mouth 2 (two) times daily.    . Coenzyme Q10 (CO Q 10 PO) Take by mouth daily.    . Glucosamine-Chondroitin (GLUCOSAMINE CHONDR COMPLEX PO) Take by mouth daily.    Marland Kitchen lisinopril (PRINIVIL,ZESTRIL) 5 MG tablet Take by mouth.    . Melatonin 5 MG/15ML LIQD     . RESVERATROL PO Take by mouth daily.    Marland Kitchen HYDROcodone-acetaminophen (NORCO/VICODIN) 5-325 MG tablet Take 1 tablet by mouth every 6 (six) hours as needed for moderate pain. 40 tablet 0  . palbociclib (IBRANCE) 100 MG capsule Take 1 capsule (100 mg total) by mouth daily with breakfast. Take whole with food. Take for 3 weeks, then 1 week off. (Patient not taking: Reported on 01/05/2016) 21 capsule 3   No current facility-administered medications for this visit.     PHYSICAL EXAMINATION: ECOG PERFORMANCE STATUS: 0 - Asymptomatic  BP (!) 153/69 (BP Location: Left Arm, Patient Position: Sitting)   Pulse 78   Temp (!) 96.9 F (36.1 C) (Tympanic)   Resp 18   Wt 110 lb (49.9 kg)   BMI 18.30 kg/m   Filed Weights   01/05/16 0928  Weight: 110 lb (49.9 kg)    GENERAL: Well-nourished well-developed; Alert, no distress and comfortable.  Alone.  EYES: no pallor or icterus OROPHARYNX: no thrush or ulceration; good dentition  NECK: supple, right supraclavicular adenopathy felt.  LYMPH:  no palpable lymphadenopathy in the cervical, axillary or inguinal regions LUNGS: clear to auscultation and  No wheeze or crackles HEART/CVS: regular rate & rhythm and no murmurs; No lower extremity edema ABDOMEN:abdomen soft, non-tender and normal bowel sounds Musculoskeletal:no cyanosis of digits and no clubbing  PSYCH: alert & oriented x 3 with fluent speech NEURO: no focal motor/sensory deficits SKIN:  no rashes or significant lesions  LABORATORY DATA:  I have reviewed the  data as listed    Component Value Date/Time   NA 134 (L) 01/05/2016 0905   K 4.1 01/05/2016 0905   CL 101 01/05/2016 0905   CO2 28 01/05/2016 0905   GLUCOSE 92 01/05/2016 0905   BUN 16 01/05/2016 0905   CREATININE 0.69 01/05/2016 0905   CALCIUM 9.0 01/05/2016 0905   PROT 7.0 01/05/2016 0905   ALBUMIN 4.6 01/05/2016 0905   AST 23 01/05/2016 0905   ALT 22 01/05/2016 0905   ALKPHOS 42 01/05/2016 0905   BILITOT 0.8 01/05/2016 0905   GFRNONAA >60 01/05/2016 0905   GFRAA >60 01/05/2016 0905    No results found for: SPEP, UPEP  Lab Results  Component Value Date   WBC 4.7 01/05/2016   NEUTROABS 2.9 01/05/2016   HGB 13.8 01/05/2016   HCT 39.9 01/05/2016   MCV 104.6 (H)  01/05/2016   PLT 241 01/05/2016      Chemistry      Component Value Date/Time   NA 134 (L) 01/05/2016 0905   K 4.1 01/05/2016 0905   CL 101 01/05/2016 0905   CO2 28 01/05/2016 0905   BUN 16 01/05/2016 0905   CREATININE 0.69 01/05/2016 0905      Component Value Date/Time   CALCIUM 9.0 01/05/2016 0905   ALKPHOS 42 01/05/2016 0905   AST 23 01/05/2016 0905   ALT 22 01/05/2016 0905   BILITOT 0.8 01/05/2016 0905     IMPRESSION: 1. Stable appearance of bilateral lower lobe pulmonary metastases. 2. Decrease in size of right axillary nodal metastasis. The right supraclavicular and prevascular index nodes are no longer measurable. 3. Aortic atherosclerosis   Electronically Signed   By: Kerby Moors M.D.   On: 12/05/2015 10:33  RADIOGRAPHIC STUDIES: I have personally reviewed the radiological images as listed and agreed with the findings in the report. No results found.   ASSESSMENT & PLAN:  Carcinoma of lower-inner quadrant of left breast in female, estrogen receptor positive ([place]) Metastatic ER/PR positive HER-2/neu negative breast cancer currently on Faslodex /ibrance- CT OCT 4th CT- stable bil pul nodules; improved right Ax.Spoke to Dr.Stroud; radiology- soft tissue stranding noted no discrete  mass noted. Recommend CT of the neck.  # Right shoulder pain/ right supraclav mass- recommend CT neck; recommend vicodin q 6-8 hours prn. Made aware of prevention of constipation   # start Ibrance today; continue faslodex.  White count 4/ labs- okay.   # follow up few days after the results of CT scan.    Orders Placed This Encounter  Procedures  . CT SOFT TISSUE NECK W CONTRAST    Standing Status:   Future    Standing Expiration Date:   04/05/2017    Order Specific Question:   Reason for Exam (SYMPTOM  OR DIAGNOSIS REQUIRED)    Answer:   right neck mass; pain; Hx of breast cancer    Order Specific Question:   Preferred imaging location?    Answer:   [facility]   # I reviewed the blood work- with the patient in detail; also reviewed the imaging independently [as summarized above]; and with the patient in detail.       Cammie Sickle, MD 01/05/2016 1:27 PM

## 2016-01-05 NOTE — Assessment & Plan Note (Addendum)
Metastatic ER/PR positive HER-2/neu negative breast cancer currently on Faslodex /ibrance- CT OCT 4th CT- stable bil pul nodules; improved right Ax.Spoke to Dr.Stroud; radiology- soft tissue stranding noted no discrete mass noted. Recommend CT of the neck.  # Right shoulder pain/ right supraclav mass- recommend CT neck; recommend vicodin q 6-8 hours prn. Made aware of prevention of constipation   # start Ibrance today; continue faslodex.  White count 4/ labs- okay.   # follow up few days after the results of CT scan.

## 2016-01-05 NOTE — Progress Notes (Signed)
Pt has a copay relief card from the [facility] [employer] for her ibrance. I contacted [facility] at [phone]. Obtained fax number [phone] to fax this card to [facility] for the patient.

## 2016-01-09 ENCOUNTER — Other Ambulatory Visit: Payer: Medicare HMO

## 2016-01-09 ENCOUNTER — Ambulatory Visit: Payer: Medicare HMO | Admitting: Internal Medicine

## 2016-01-09 ENCOUNTER — Ambulatory Visit
Admission: RE | Admit: 2016-01-09 | Discharge: 2016-01-09 | Disposition: A | Discharge status: Home / Self Care | Payer: Medicare HMO | Source: Ambulatory Visit | Attending: Internal Medicine | Admitting: Internal Medicine

## 2016-01-09 ENCOUNTER — Ambulatory Visit: Payer: Medicare HMO

## 2016-01-09 DIAGNOSIS — C50312 Malignant neoplasm of lower-inner quadrant of left female breast: Secondary | ICD-10-CM

## 2016-01-09 DIAGNOSIS — D4989 Neoplasm of unspecified behavior of other specified sites: Secondary | ICD-10-CM | POA: Diagnosis not present

## 2016-01-09 DIAGNOSIS — Z170 Estrogen receptor positive status [ER+]: Secondary | ICD-10-CM | POA: Diagnosis not present

## 2016-01-09 DIAGNOSIS — R221 Localized swelling, mass and lump, neck: Secondary | ICD-10-CM | POA: Diagnosis present

## 2016-01-09 MED ORDER — IOPAMIDOL (ISOVUE-300) INJECTION 61%
75.0000 mL | Freq: Once | INTRAVENOUS | Status: AC | PRN
  Administered 2016-01-09: 75 mL via INTRAVENOUS

## 2016-01-11 ENCOUNTER — Other Ambulatory Visit: Payer: Self-pay | Admitting: Pathology

## 2016-01-13 ENCOUNTER — Inpatient Hospital Stay (HOSPITAL_BASED_OUTPATIENT_CLINIC_OR_DEPARTMENT_OTHER): Payer: Medicare HMO | Admitting: Internal Medicine

## 2016-01-13 DIAGNOSIS — C50811 Malignant neoplasm of overlapping sites of right female breast: Secondary | ICD-10-CM

## 2016-01-13 DIAGNOSIS — I700 Atherosclerosis of aorta: Secondary | ICD-10-CM

## 2016-01-13 DIAGNOSIS — Z170 Estrogen receptor positive status [ER+]: Secondary | ICD-10-CM | POA: Diagnosis not present

## 2016-01-13 DIAGNOSIS — C7801 Secondary malignant neoplasm of right lung: Secondary | ICD-10-CM

## 2016-01-13 DIAGNOSIS — B20 Human immunodeficiency virus [HIV] disease: Secondary | ICD-10-CM

## 2016-01-13 DIAGNOSIS — C50312 Malignant neoplasm of lower-inner quadrant of left female breast: Secondary | ICD-10-CM

## 2016-01-13 DIAGNOSIS — Z9011 Acquired absence of right breast and nipple: Secondary | ICD-10-CM

## 2016-01-13 DIAGNOSIS — Z79818 Long term (current) use of other agents affecting estrogen receptors and estrogen levels: Secondary | ICD-10-CM

## 2016-01-13 DIAGNOSIS — M25511 Pain in right shoulder: Secondary | ICD-10-CM

## 2016-01-13 DIAGNOSIS — M1990 Unspecified osteoarthritis, unspecified site: Secondary | ICD-10-CM

## 2016-01-13 DIAGNOSIS — Z86000 Personal history of in-situ neoplasm of breast: Secondary | ICD-10-CM

## 2016-01-13 DIAGNOSIS — Z171 Estrogen receptor negative status [ER-]: Secondary | ICD-10-CM

## 2016-01-13 DIAGNOSIS — I10 Essential (primary) hypertension: Secondary | ICD-10-CM

## 2016-01-13 DIAGNOSIS — Z9223 Personal history of estrogen therapy: Secondary | ICD-10-CM

## 2016-01-13 DIAGNOSIS — C773 Secondary and unspecified malignant neoplasm of axilla and upper limb lymph nodes: Secondary | ICD-10-CM

## 2016-01-13 DIAGNOSIS — C7802 Secondary malignant neoplasm of left lung: Secondary | ICD-10-CM

## 2016-01-13 DIAGNOSIS — Z79899 Other long term (current) drug therapy: Secondary | ICD-10-CM

## 2016-01-13 NOTE — Progress Notes (Signed)
Patient is here for follow up  

## 2016-01-13 NOTE — Assessment & Plan Note (Addendum)
Metastatic ER/PR positive HER-2/neu negative breast cancer currently on Faslodex /ibrance- CT OCT 4th CT- stable bil pul nodules; improved right Ax. But CT neck soft tissue stranding noted no discrete mass noted- but causing significant pain- recommend palliative RT; I spoke to Dr.Crsytal.   # Discussed with the patient that if she continues to have progressive pain or further progression clinically or on imaging- she will need switching of therapies to other options including chemotherapy versus afinitor + Aromasin.   # Right shoulder pain/ right supraclav mass-  Sec to progression of disease; plan RT.   On Ibrance;  continue faslodex.  # Follow up in 6 weeks/labs/shots; 2 weeks/labs/shots. Patient's images were also reviewed at the tumor conference; reviewed myself.

## 2016-01-13 NOTE — Progress Notes (Signed)
Hayley [name] OFFICE PROGRESS NOTE  Patient Care Team: Lenard Simmer, MD as PCP - General (Endocrinology)  Cancer of breast [facility])   Staging form: Breast, AJCC 7th Edition     Clinical: Stage IV (T4, N1, M1) - Signed by Forest Gleason, MD on 01/20/2015    Oncology History   # June 2015-RIGHT BREAST CA ? Recurrent- IMC [nipple inverted; T4-skin invol; s/p Mastec; No LN; Dr.Smith ]; ER/PR- Pos 51-90%;Her 2 neu-NEG;Declined RT/Anti-hormone;   #  STAGE IV- Nov 2016- IMC ER-90%; PR-51-90%; her 2 neu-NEG [right ax LN Bx] NOV 2016- IBRANCE + Letrozole; April 2017- PET- RLL/LLL- progression; Supraclav/Ax LN- STABLE/TM- rising;June 20th 2017 FASLODEX+ Ibrance 169m; OCT 2nd- CT C/A/P- STABLE pul mets.   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 [[facility] s/p lumpec;s/p Tam x9 M RT]  # 2015- R UE Lymphedema on Sleeve     Cancer of breast ([place]   01/20/2015 Initial Diagnosis    Cancer of breast ([facility]       Malignant neoplasm of overlapping sites of right breast ([place]   09/12/2015 Initial Diagnosis    Malignant neoplasm of overlapping sites of right breast ([facility]       Malignant neoplasm of lower-inner quadrant of left breast in female, estrogen receptor negative ([place]    Initial Diagnosis    Malignant neoplasm of lower-inner quadrant of left breast in female, estrogen receptor negative ([place]      Carcinoma of lower-inner quadrant of left breast in female, estrogen receptor positive ([place]   01/05/2016 Initial Diagnosis    Carcinoma of lower-inner quadrant of left breast in female, estrogen receptor positive ([place]        INTERVAL HISTORY:   Hayley Don89y.o.  female pleasant patient above history of Metastatic ER/PR positive HER-2/neu negative breast cancer currently on ibrance plus Faslodex is here for follow to review the results of her CT scan.  Patient's right shoulder pain has improved since starting the narcotic pain  medication. Patient has been taking 1-2 pills a day.  Denies any tingling and numbness. Denies any pain. Denies any diarrhea. Denies any nausea vomiting. No shortness of breath or chest pain. No cough. She is fairly active. Denies any hot flashes.  REVIEW OF SYSTEMS:  A complete 10 point review of system is done which is negative except mentioned above/history of present illness.   PAST MEDICAL HISTORY :  Past Medical History:  Diagnosis Date  . Arthritis   . Breast [place]   Right Breast c radiation -- 2015 Mastectomy  . [place] of [place] ([place] 01/[place]  . HOH (hard of hearing)    AIDS  . Hypertension   . Malignant neoplasm of lower-inner quadrant of left breast in female, estrogen receptor negative ([place]     PAST SURGICAL HISTORY :   Past Surgical History:  Procedure Laterality Date  . ABDOMINAL HYSTERECTOMY    . BREAST LUMPECTOMY Right 1998   c Radiation  . CATARACT EXTRACTION W/PHACO Left 08/08/2015   Procedure: CATARACT EXTRACTION PHACO AND INTRAOCULAR LENS PLACEMENT (IOC);  Surgeon: CLeandrew Koyanagi MD;  Location: [facility] ORS;  Service: Ophthalmology;  Laterality: Left;  [place]58.0 AP% 14.9 CDE 8.60 Fluid Pack lot # [id number]  . CATARACT EXTRACTION W/PHACO Right 12/28/2015   Procedure: CATARACT EXTRACTION PHACO AND INTRAOCULAR LENS PLACEMENT (IOC);  Surgeon: CLeandrew Koyanagi MD;  [place] [facility]  Service: Ophthalmology;  Laterality: Right;  requests early  . JOINT REPLACEMENT Right 2008   knee  . MASTECTOMY Right 2015   After screening in May 2015 - Pt choice    FAMILY HISTORY :  No family history on file.  SOCIAL HISTORY:   Social History  Substance Use Topics  . Smoking status: Never Smoker  . Smokeless tobacco: Never Used  . Alcohol use No    ALLERGIES:  is allergic to tetracyclines & related.  MEDICATIONS:  Current Outpatient Prescriptions  Medication Sig Dispense Refill  . ALPHA LIPOIC ACID PO Take by mouth daily.     . calcium carbonate (TUMS - DOSED IN MG ELEMENTAL CALCIUM) 500 MG chewable tablet     . Cholecalciferol (VITAMIN D3 PO) Take 4,000 Units by mouth 2 (two) times daily.    . Coenzyme Q10 (CO Q 10 PO) Take by mouth daily.    . Glucosamine-Chondroitin (GLUCOSAMINE CHONDR COMPLEX PO) Take by mouth daily.    Marland Kitchen HYDROcodone-acetaminophen (NORCO/VICODIN) 5-325 MG tablet Take 1 tablet by mouth every 6 (six) hours as needed for moderate pain. 40 tablet 0  . lisinopril (PRINIVIL,ZESTRIL) 5 MG tablet Take by mouth.    . Melatonin 5 MG/15ML LIQD     . palbociclib (IBRANCE) 100 MG capsule Take 1 capsule (100 mg total) by mouth daily with breakfast. Take whole with food. Take for 3 weeks, then 1 week off. 21 capsule 3  . RESVERATROL PO Take by mouth daily.     No current facility-administered medications for this visit.     PHYSICAL EXAMINATION: ECOG PERFORMANCE STATUS: 0 - Asymptomatic  BP (!) 164/73 (BP Location: Left Arm, Patient Position: Sitting)   Pulse 76   Temp 97.3 F (36.3 C) (Tympanic)   Resp 18   Wt 111 lb 6.4 oz (50.5 kg)   SpO2 95%   BMI 18.54 kg/m   Filed Weights   01/13/16 1051  Weight: 111 lb 6.4 oz (50.5 kg)    GENERAL: Well-nourished well-developed; Alert, no distress and comfortable.  Alone.  EYES: no pallor or icterus OROPHARYNX: no thrush or ulceration; good dentition  NECK: supple, right supraclavicular adenopathy felt.  LYMPH:  no palpable lymphadenopathy in the cervical, axillary or inguinal regions LUNGS: clear to auscultation and  No wheeze or crackles HEART/CVS: regular rate & rhythm and no murmurs; No lower extremity edema ABDOMEN:abdomen soft, non-tender and normal bowel sounds Musculoskeletal:no cyanosis of digits and no clubbing  PSYCH: alert & oriented x 3 with fluent speech NEURO: no focal motor/sensory deficits SKIN:  no rashes or significant lesions  LABORATORY DATA:  I have reviewed the data as listed    Component Value Date/Time   NA 134 (L)  01/05/2016 0905   K 4.1 01/05/2016 0905   CL 101 01/05/2016 0905   CO2 28 01/05/2016 0905   GLUCOSE 92 01/05/2016 0905   BUN 16 01/05/2016 0905   CREATININE 0.69 01/05/2016 0905   CALCIUM 9.0 01/05/2016 0905   PROT 7.0 01/05/2016 0905   ALBUMIN 4.6 01/05/2016 0905   AST 23 01/05/2016 0905   ALT 22 01/05/2016 0905   ALKPHOS 42 01/05/2016 0905   BILITOT 0.8 01/05/2016 0905   GFRNONAA >60 01/05/2016 0905   GFRAA >60 01/05/2016 0905    No results found for: SPEP, UPEP  Lab Results  Component Value Date   WBC 4.7 01/05/2016   NEUTROABS 2.9 01/05/2016   HGB 13.8 01/05/2016   HCT 39.9 01/05/2016   MCV 104.6 (H) 01/05/2016   PLT  241 01/05/2016      Chemistry      Component Value Date/Time   NA 134 (L) 01/05/2016 0905   K 4.1 01/05/2016 0905   CL 101 01/05/2016 0905   CO2 28 01/05/2016 0905   BUN 16 01/05/2016 0905   CREATININE 0.69 01/05/2016 0905      Component Value Date/Time   CALCIUM 9.0 01/05/2016 0905   ALKPHOS 42 01/05/2016 0905   AST 23 01/05/2016 0905   ALT 22 01/05/2016 0905   BILITOT 0.8 01/05/2016 0905     IMPRESSION: 1. Stable appearance of bilateral lower lobe pulmonary metastases. 2. Decrease in size of right axillary nodal metastasis. The right supraclavicular and prevascular index nodes are no longer measurable. 3. Aortic atherosclerosis   Electronically Signed   By: Kerby Moors M.D.   On: 12/05/2015 10:33  RADIOGRAPHIC STUDIES: I have personally reviewed the radiological images as listed and agreed with the findings in the report. No results found.   ASSESSMENT & PLAN:  Carcinoma of lower-inner quadrant of left breast in female, estrogen receptor positive ([place]) Metastatic ER/PR positive HER-2/neu negative breast cancer currently on Faslodex /ibrance- CT OCT 4th CT- stable bil pul nodules; improved right Ax. But CT neck soft tissue stranding noted no discrete mass noted- but causing significant pain- recommend palliative RT; I spoke to  Dr.Crsytal.   # Discussed with the patient that if she continues to have progressive pain or further progression clinically or on imaging- she will need switching of therapies to other options including chemotherapy versus afinitor + Aromasin.   # Right shoulder pain/ right supraclav mass-  Sec to progression of disease; plan RT.   On Ibrance;  continue faslodex.  # Follow up in 6 weeks/labs/shots; 2 weeks/labs/shots.    Orders Placed This Encounter  Procedures  . CBC with Differential    Standing Status:   Future    Standing Expiration Date:   01/12/2017  . Comprehensive metabolic panel    Standing Status:   Future    Standing Expiration Date:   01/12/2017  . CBC with Differential    Standing Status:   Future    Standing Expiration Date:   01/12/2017  . Comprehensive metabolic panel    Standing Status:   Future    Standing Expiration Date:   01/12/2017   # I reviewed the blood work- with the patient in detail; also reviewed the imaging independently [as summarized above]; and with the patient in detail.       Cammie Sickle, MD 01/13/2016 3:16 PM

## 2016-01-18 ENCOUNTER — Ambulatory Visit
Admission: RE | Admit: 2016-01-18 | Discharge: 2016-01-18 | Disposition: A | Discharge status: Home / Self Care | Payer: Medicare HMO | Source: Ambulatory Visit | Attending: Radiation Oncology | Admitting: Radiation Oncology

## 2016-01-18 ENCOUNTER — Encounter: Payer: Self-pay | Admitting: Radiation Oncology

## 2016-01-18 VITALS — BP 143/75 | HR 91 | Temp 97.8°F | Wt 111.4 lb

## 2016-01-18 DIAGNOSIS — Z510 Encounter for antineoplastic radiation therapy: Secondary | ICD-10-CM | POA: Insufficient documentation

## 2016-01-18 DIAGNOSIS — Z170 Estrogen receptor positive status [ER+]: Secondary | ICD-10-CM | POA: Insufficient documentation

## 2016-01-18 DIAGNOSIS — C50811 Malignant neoplasm of overlapping sites of right female breast: Secondary | ICD-10-CM | POA: Diagnosis not present

## 2016-01-18 DIAGNOSIS — R200 Anesthesia of skin: Secondary | ICD-10-CM | POA: Diagnosis not present

## 2016-01-18 DIAGNOSIS — I890 Lymphedema, not elsewhere classified: Secondary | ICD-10-CM | POA: Insufficient documentation

## 2016-01-18 DIAGNOSIS — C7951 Secondary malignant neoplasm of bone: Secondary | ICD-10-CM | POA: Diagnosis not present

## 2016-01-18 DIAGNOSIS — I10 Essential (primary) hypertension: Secondary | ICD-10-CM | POA: Diagnosis not present

## 2016-01-18 DIAGNOSIS — Z9011 Acquired absence of right breast and nipple: Secondary | ICD-10-CM | POA: Insufficient documentation

## 2016-01-18 DIAGNOSIS — Z853 Personal history of malignant neoplasm of breast: Secondary | ICD-10-CM | POA: Diagnosis not present

## 2016-01-18 NOTE — Consult Note (Signed)
NEW PATIENT EVALUATION  Name: Hayley [name]  MRN: [id number]  Date:   01/18/2016     DOB: Feb 03, 1934   This 82 y.o. female patient presents to the clinic for initial evaluation of metastatic invasive mammary carcinoma the right supra clavicular fossa causing significant narcotic dependent pain in her right upper extremity and neck.  REFERRING PHYSICIAN: Morayati, [name], MD  CHIEF COMPLAINT:  Chief Complaint  Patient presents with  . Cancer    DIAGNOSIS: The encounter diagnosis was Malignant neoplasm of overlapping sites of right breast ([place]).   PREVIOUS INVESTIGATIONS:  PET CT and CT scans reviewed Pathology report reviewed Clinical notes reviewed Case presented at weekly tumor conference  HPI: Patient is a pleasant 82 year old female whose history dates back to 2015 when she presented with invasive mammary carcinoma with nipple inversion T4 lesion with skin involvement status post mastectomy for ER/PR positive HER-2/neu negative disease. She declined chemotherapy or radiation therapy and was started on tamoxifen. She progressed to stage IV disease in November 2016 treated with I Brandts and letrozole. PET CT scan showed progression of disease with hypermetabolic activity in the lungs and right supraclavicular fossa. She does have a history of approximate 30 years prior having radiation therapy to the right breast [facility] for ductal carcinoma in situ does not recall being treated to her right supraclavicular fossa. Recent CT scan also demonstrates metastatic disease in the right supraclavicular fossa most likely involving the brachial plexus causing her symptoms. She does have significant lymphedema of her right upper extremity does wear a sleeve. She is seen today for consideration of palliative radiation therapy to her right supraclavicular fossa. She does have some numbness in her distal extremities upper extremity no weakness or other sensory level.  PLANNED TREATMENT  REGIMEN: Palliative radiation therapy to right supraclavicular fossa  PAST MEDICAL HISTORY:  has a past medical history of Arthritis; Breast cancer ([place]) (17+ years ago); Cancer of breast ([place]) (01/20/2015); HOH (hard of hearing); Hypertension; and Malignant neoplasm of lower-inner quadrant of left breast in female, estrogen receptor negative ([place]).    PAST SURGICAL HISTORY:  Past Surgical History:  Procedure Laterality Date  . ABDOMINAL HYSTERECTOMY    . BREAST LUMPECTOMY Right 1998   c Radiation  . CATARACT EXTRACTION W/PHACO Left 08/08/2015   Procedure: CATARACT EXTRACTION PHACO AND INTRAOCULAR LENS PLACEMENT (IOC);  Surgeon: Leandrew Koyanagi, MD;  Location: [facility] ORS;  Service: Ophthalmology;  Laterality: Left;  [place] 58.0 AP% 14.9 CDE 8.60 Fluid Pack lot # [id number]  . CATARACT EXTRACTION W/PHACO Right 12/28/2015   Procedure: CATARACT EXTRACTION PHACO AND INTRAOCULAR LENS PLACEMENT (IOC);  Surgeon: Leandrew Koyanagi, MD;  Location: [facility];  Service: Ophthalmology;  Laterality: Right;  requests early  . JOINT REPLACEMENT Right 2008   knee  . MASTECTOMY Right 2015   After screening in May 2015 - Pt choice    FAMILY HISTORY: family history is not on file.  SOCIAL HISTORY:  reports that she has never smoked. She has never used smokeless tobacco. She reports that she does not drink alcohol or use drugs.  ALLERGIES: Tetracyclines & related  MEDICATIONS:  Current Outpatient Prescriptions  Medication Sig Dispense Refill  . ALPHA LIPOIC ACID PO Take by mouth daily.    . calcium carbonate (TUMS - DOSED IN MG ELEMENTAL CALCIUM) 500 MG chewable tablet     . Cholecalciferol (VITAMIN D3 PO) Take 4,000 Units by mouth 2 (two) times daily.    . Coenzyme Q10 (CO Q  10 PO) Take by mouth daily.    . Glucosamine-Chondroitin (GLUCOSAMINE CHONDR COMPLEX PO) Take by mouth daily.    Marland Kitchen HYDROcodone-acetaminophen (NORCO/VICODIN) 5-325 MG tablet Take 1 tablet by mouth every 6 (six) hours as  needed for moderate pain. 40 tablet 0  . lisinopril (PRINIVIL,ZESTRIL) 5 MG tablet Take by mouth.    . Melatonin 5 MG/15ML LIQD     . palbociclib (IBRANCE) 100 MG capsule Take 1 capsule (100 mg total) by mouth daily with breakfast. Take whole with food. Take for 3 weeks, then 1 week off. 21 capsule 3  . RESVERATROL PO Take by mouth daily.     No current facility-administered medications for this encounter.     ECOG PERFORMANCE STATUS:  1 - Symptomatic but completely ambulatory  REVIEW OF SYSTEMS: Patient is extremely active except for the pain in her right shoulder and slight peripheral neuropathy Patient denies any weight loss, fatigue, weakness, fever, chills or night sweats. Patient denies any loss of vision, blurred vision. Patient denies any ringing  of the ears or hearing loss. No irregular heartbeat. Patient denies heart murmur or history of fainting. Patient denies any chest pain or pain radiating to her upper extremities. Patient denies any shortness of breath, difficulty breathing at night, cough or hemoptysis. Patient denies any swelling in the lower legs. Patient denies any nausea vomiting, vomiting of blood, or coffee ground material in the vomitus. Patient denies any stomach pain. Patient states has had normal bowel movements no significant constipation or diarrhea. Patient denies any dysuria, hematuria or significant nocturia. Patient denies any problems walking, swelling in the joints or loss of balance. Patient denies any skin changes, loss of hair or loss of weight. Patient denies any excessive worrying or anxiety or significant depression. Patient denies any problems with insomnia. Patient denies excessive thirst, polyuria, polydipsia. Patient denies any swollen glands, patient denies easy bruising or easy bleeding. Patient denies any recent infections, allergies or URI. Patient "s visual fields have not changed significantly in recent time.    PHYSICAL EXAM: BP (!) 143/75   Pulse  91   Temp 97.8 F (36.6 C)   Wt 111 lb 7.1 oz (50.5 kg)   BMI 18.55 kg/m  Thin well-developed female in NAD. Range of motion of her right upper extremity does not elicit pain motor sensory and DTR levels are equal and symmetric in the upper lower extremities. She has no palpable mass in her right supraclavicular fossa just to complete feeling of fullness. Well-developed well-nourished patient in NAD. HEENT reveals PERLA, EOMI, discs not visualized.  Oral cavity is clear. No oral mucosal lesions are identified. Neck is clear without evidence of cervical or supraclavicular adenopathy. Lungs are clear to A&P. Cardiac examination is essentially unremarkable with regular rate and rhythm without murmur rub or thrill. Abdomen is benign with no organomegaly or masses noted. Motor sensory and DTR levels are equal and symmetric in the upper and lower extremities. Cranial nerves II through XII are grossly intact. Proprioception is intact. No peripheral adenopathy or edema is identified. No motor or sensory levels are noted. Crude visual fields are within normal range.  LABORATORY DATA: Pathology reports reviewed    RADIOLOGY RESULTS: PET CT and CT scans reviewed and compatible with the above-stated findings   IMPRESSION: Progressive metastatic stage IV breast cancer with involvement of the right brachial plexus and supraclavicular fossa for palliative radiation therapy  PLAN: At this time I to go ahead with a short short course of palliative radiation  therapy to her right supraclavicular fossa. I believe we will avoid any areas of overlap from her previous treatment fields 30 years prior although that is a possibility based on lack of records of that time. I would plan on delivering 3000 cGy in 10 fractions. Risks and benefits of treatment including skin reaction fatigue alteration of blood counts and possible overlap with previous fields which should not cause any significant morbidity all were discussed in  detail with the patient. She seems to comprehend my treatment plan well. I personally set up and ordered CT simulation for early next week.Marland Kitchen She will be receiving her chemotherapy during treatment concurrently.There will be extra effort by both professional staff as well as technical staff to coordinate and manage concurrent chemoradiation and ensuing side effects during her treatments.  I would like to take this opportunity to thank you for allowing me to participate in the care of your patient.Armstead Peaks., MD

## 2016-01-19 ENCOUNTER — Other Ambulatory Visit: Payer: Self-pay | Admitting: *Deleted

## 2016-01-19 MED ORDER — HYDROCODONE-ACETAMINOPHEN 5-325 MG PO TABS: 1.0000 | ORAL_TABLET | Freq: Four times a day (QID) | ORAL | 0 refills | Status: DC | PRN

## 2016-01-24 ENCOUNTER — Ambulatory Visit
Admission: RE | Admit: 2016-01-24 | Discharge: 2016-01-24 | Disposition: A | Discharge status: Home / Self Care | Payer: Medicare HMO | Source: Ambulatory Visit | Attending: Radiation Oncology | Admitting: Radiation Oncology

## 2016-01-24 DIAGNOSIS — Z510 Encounter for antineoplastic radiation therapy: Secondary | ICD-10-CM | POA: Diagnosis not present

## 2016-01-31 DIAGNOSIS — Z510 Encounter for antineoplastic radiation therapy: Secondary | ICD-10-CM | POA: Diagnosis not present

## 2016-02-01 ENCOUNTER — Other Ambulatory Visit: Payer: Self-pay | Admitting: *Deleted

## 2016-02-01 DIAGNOSIS — C50811 Malignant neoplasm of overlapping sites of right female breast: Secondary | ICD-10-CM

## 2016-02-02 ENCOUNTER — Inpatient Hospital Stay: Payer: Medicare HMO

## 2016-02-02 DIAGNOSIS — Z170 Estrogen receptor positive status [ER+]: Principal | ICD-10-CM

## 2016-02-02 DIAGNOSIS — C50811 Malignant neoplasm of overlapping sites of right female breast: Secondary | ICD-10-CM

## 2016-02-02 DIAGNOSIS — C50312 Malignant neoplasm of lower-inner quadrant of left female breast: Secondary | ICD-10-CM

## 2016-02-02 LAB — CBC WITH DIFFERENTIAL/PLATELET
BASOS ABS: 0.1 10*3/uL (ref 0–0.1)
Basophils Relative: 2 %
EOS PCT: 2 %
Eosinophils Absolute: 0.1 10*3/uL (ref 0–0.7)
HCT: 37.8 % (ref 35.0–47.0)
Hemoglobin: 13 g/dL (ref 12.0–16.0)
LYMPHS PCT: 23 %
Lymphs Abs: 0.6 10*3/uL — ABNORMAL LOW (ref 1.0–3.6)
MCH: 36 pg — ABNORMAL HIGH (ref 26.0–34.0)
MCHC: 34.6 g/dL (ref 32.0–36.0)
MCV: 104.2 fL — AB (ref 80.0–100.0)
MONO ABS: 0.5 10*3/uL (ref 0.2–0.9)
MONOS PCT: 19 %
Neutro Abs: 1.4 10*3/uL (ref 1.4–6.5)
Neutrophils Relative %: 54 %
PLATELETS: 121 10*3/uL — AB (ref 150–440)
RBC: 3.62 MIL/uL — ABNORMAL LOW (ref 3.80–5.20)
RDW: 15.4 % — AB (ref 11.5–14.5)
WBC: 2.6 10*3/uL — ABNORMAL LOW (ref 3.6–11.0)

## 2016-02-02 LAB — COMPREHENSIVE METABOLIC PANEL
ALT: 21 U/L (ref 14–54)
ANION GAP: 6 (ref 5–15)
AST: 22 U/L (ref 15–41)
Albumin: 4.1 g/dL (ref 3.5–5.0)
Alkaline Phosphatase: 36 U/L — ABNORMAL LOW (ref 38–126)
BUN: 14 mg/dL (ref 6–20)
CHLORIDE: 103 mmol/L (ref 101–111)
CO2: 29 mmol/L (ref 22–32)
Calcium: 8.9 mg/dL (ref 8.9–10.3)
Creatinine, Ser: 0.71 mg/dL (ref 0.44–1.00)
Glucose, Bld: 84 mg/dL (ref 65–99)
POTASSIUM: 3.9 mmol/L (ref 3.5–5.1)
Sodium: 138 mmol/L (ref 135–145)
TOTAL PROTEIN: 6.5 g/dL (ref 6.5–8.1)
Total Bilirubin: 0.6 mg/dL (ref 0.3–1.2)

## 2016-02-02 MED ORDER — FULVESTRANT 250 MG/5ML IM SOLN
500.0000 mg | Freq: Once | INTRAMUSCULAR | Status: AC
  Administered 2016-02-02: 500 mg via INTRAMUSCULAR
  Filled 2016-02-02: qty 10

## 2016-02-08 ENCOUNTER — Ambulatory Visit
Admission: RE | Admit: 2016-02-08 | Discharge: 2016-02-08 | Disposition: A | Discharge status: Home / Self Care | Payer: Medicare HMO | Source: Ambulatory Visit | Attending: Radiation Oncology | Admitting: Radiation Oncology

## 2016-02-08 DIAGNOSIS — Z510 Encounter for antineoplastic radiation therapy: Secondary | ICD-10-CM | POA: Diagnosis not present

## 2016-02-09 ENCOUNTER — Ambulatory Visit
Admission: RE | Admit: 2016-02-09 | Discharge: 2016-02-09 | Disposition: A | Discharge status: Home / Self Care | Payer: Medicare HMO | Source: Ambulatory Visit | Attending: Radiation Oncology | Admitting: Radiation Oncology

## 2016-02-09 DIAGNOSIS — Z510 Encounter for antineoplastic radiation therapy: Secondary | ICD-10-CM | POA: Diagnosis not present

## 2016-02-10 ENCOUNTER — Ambulatory Visit
Admission: RE | Admit: 2016-02-10 | Discharge: 2016-02-10 | Disposition: A | Discharge status: Home / Self Care | Payer: Medicare HMO | Source: Ambulatory Visit | Attending: Radiation Oncology | Admitting: Radiation Oncology

## 2016-02-10 DIAGNOSIS — Z510 Encounter for antineoplastic radiation therapy: Secondary | ICD-10-CM | POA: Diagnosis not present

## 2016-02-13 ENCOUNTER — Ambulatory Visit
Admission: RE | Admit: 2016-02-13 | Discharge: 2016-02-13 | Disposition: A | Discharge status: Home / Self Care | Payer: Medicare HMO | Source: Ambulatory Visit | Attending: Radiation Oncology | Admitting: Radiation Oncology

## 2016-02-13 DIAGNOSIS — Z510 Encounter for antineoplastic radiation therapy: Secondary | ICD-10-CM | POA: Diagnosis not present

## 2016-02-14 ENCOUNTER — Ambulatory Visit
Admission: RE | Admit: 2016-02-14 | Discharge: 2016-02-14 | Disposition: A | Discharge status: Home / Self Care | Payer: Medicare HMO | Source: Ambulatory Visit | Attending: Radiation Oncology | Admitting: Radiation Oncology

## 2016-02-14 DIAGNOSIS — Z510 Encounter for antineoplastic radiation therapy: Secondary | ICD-10-CM | POA: Diagnosis not present

## 2016-02-15 ENCOUNTER — Ambulatory Visit
Admission: RE | Admit: 2016-02-15 | Discharge: 2016-02-15 | Disposition: A | Discharge status: Home / Self Care | Payer: Medicare HMO | Source: Ambulatory Visit | Attending: Radiation Oncology | Admitting: Radiation Oncology

## 2016-02-15 ENCOUNTER — Inpatient Hospital Stay: Payer: Medicare HMO | Attending: Radiation Oncology

## 2016-02-15 DIAGNOSIS — Z170 Estrogen receptor positive status [ER+]: Secondary | ICD-10-CM | POA: Insufficient documentation

## 2016-02-15 DIAGNOSIS — C50811 Malignant neoplasm of overlapping sites of right female breast: Secondary | ICD-10-CM | POA: Insufficient documentation

## 2016-02-15 DIAGNOSIS — Z510 Encounter for antineoplastic radiation therapy: Secondary | ICD-10-CM | POA: Diagnosis not present

## 2016-02-15 LAB — CBC
HCT: 37.3 % (ref 35.0–47.0)
Hemoglobin: 12.8 g/dL (ref 12.0–16.0)
MCH: 35.9 pg — AB (ref 26.0–34.0)
MCHC: 34.4 g/dL (ref 32.0–36.0)
MCV: 104.4 fL — AB (ref 80.0–100.0)
PLATELETS: 180 10*3/uL (ref 150–440)
RBC: 3.58 MIL/uL — AB (ref 3.80–5.20)
RDW: 15.5 % — ABNORMAL HIGH (ref 11.5–14.5)
WBC: 2.3 10*3/uL — ABNORMAL LOW (ref 3.6–11.0)

## 2016-02-16 ENCOUNTER — Ambulatory Visit
Admission: RE | Admit: 2016-02-16 | Discharge: 2016-02-16 | Disposition: A | Discharge status: Home / Self Care | Payer: Medicare HMO | Source: Ambulatory Visit | Attending: Radiation Oncology | Admitting: Radiation Oncology

## 2016-02-16 DIAGNOSIS — Z510 Encounter for antineoplastic radiation therapy: Secondary | ICD-10-CM | POA: Diagnosis not present

## 2016-02-17 ENCOUNTER — Ambulatory Visit
Admission: RE | Admit: 2016-02-17 | Discharge: 2016-02-17 | Disposition: A | Discharge status: Home / Self Care | Payer: Medicare HMO | Source: Ambulatory Visit | Attending: Radiation Oncology | Admitting: Radiation Oncology

## 2016-02-17 DIAGNOSIS — Z510 Encounter for antineoplastic radiation therapy: Secondary | ICD-10-CM | POA: Diagnosis not present

## 2016-02-20 ENCOUNTER — Ambulatory Visit
Admission: RE | Admit: 2016-02-20 | Discharge: 2016-02-20 | Disposition: A | Discharge status: Home / Self Care | Payer: Medicare HMO | Source: Ambulatory Visit | Attending: Radiation Oncology | Admitting: Radiation Oncology

## 2016-02-20 DIAGNOSIS — Z510 Encounter for antineoplastic radiation therapy: Secondary | ICD-10-CM | POA: Diagnosis not present

## 2016-02-21 ENCOUNTER — Telehealth: Payer: Self-pay | Admitting: *Deleted

## 2016-02-21 ENCOUNTER — Ambulatory Visit
Admission: RE | Admit: 2016-02-21 | Discharge: 2016-02-21 | Disposition: A | Discharge status: Home / Self Care | Payer: Medicare HMO | Source: Ambulatory Visit | Attending: Radiation Oncology | Admitting: Radiation Oncology

## 2016-02-21 DIAGNOSIS — Z510 Encounter for antineoplastic radiation therapy: Secondary | ICD-10-CM | POA: Diagnosis not present

## 2016-02-21 NOTE — Telephone Encounter (Signed)
Patient would like to know how to facilitate direct billing to her PAN Hayley [name]. Patient was advised to bring copies of all PAN paperwork to [place] on her next visit and route it to [facility].

## 2016-02-22 ENCOUNTER — Ambulatory Visit
Admission: RE | Admit: 2016-02-22 | Discharge: 2016-02-22 | Disposition: A | Discharge status: Home / Self Care | Payer: Medicare HMO | Source: Ambulatory Visit | Attending: Radiation Oncology | Admitting: Radiation Oncology

## 2016-02-22 DIAGNOSIS — Z510 Encounter for antineoplastic radiation therapy: Secondary | ICD-10-CM | POA: Diagnosis not present

## 2016-02-29 NOTE — Telephone Encounter (Signed)
Pt call acknowledge from Dr. Aletha Halim team. In my experience, the PAN program would like the insurance claim to be processed as normal and the patient is directly responsible for sending any remaining HP bills to PAN.

## 2016-03-08 ENCOUNTER — Inpatient Hospital Stay: Payer: Medicare HMO

## 2016-03-08 ENCOUNTER — Inpatient Hospital Stay: Payer: Medicare HMO | Admitting: Internal Medicine

## 2016-03-08 NOTE — Assessment & Plan Note (Deleted)
Metastatic ER/PR positive HER-2/neu negative breast cancer currently on Faslodex /ibrance- CT OCT 4th CT- stable bil pul nodules; improved right Ax. But CT neck soft tissue stranding noted no discrete mass noted- but causing significant pain- recommend palliative RT; I spoke to Dr.Crsytal.   # Discussed with the patient that if she continues to have progressive pain or further progression clinically or on imaging- she will need switching of therapies to other options including chemotherapy versus afinitor + Aromasin.   # Right shoulder pain/ right supraclav mass-  Sec to progression of disease; plan RT.   On Ibrance;  continue faslodex.  # Follow up in 6 weeks/labs/shots; 2 weeks/labs/shots. Patient's images were also reviewed at the tumor conference; reviewed myself.

## 2016-03-08 NOTE — Progress Notes (Deleted)
[facility] OFFICE PROGRESS NOTE  Patient Care Team: Hayley Simmer, MD as PCP - General (Endocrinology)  Cancer of breast [facility])   Staging form: Breast, AJCC 7th Edition     Clinical: Stage IV (T4, N1, M1) - Signed by Hayley Gleason, MD on 01/20/2015    Oncology History   # June 2015-RIGHT BREAST CA ? Recurrent- IMC [nipple inverted; T4-skin invol; s/p Mastec; No LN; Dr.Smith ]; ER/PR- Pos 51-90%;Her 2 neu-NEG;Declined RT/Anti-hormone;   #  STAGE IV- Nov 2016- IMC ER-90%; PR-51-90%; her 2 neu-NEG [right ax LN Bx] NOV 2016- IBRANCE + Letrozole; April 2017- PET- RLL/LLL- progression; Supraclav/Ax LN- STABLE/TM- rising;June 20th 2017 FASLODEX+ Ibrance 169m; OCT 2nd- CT C/A/P- STABLE pul mets.   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 [[name] s/p lumpec;s/p Tam x9 M RT]  # 2015- R UE Lymphedema on Sleeve     Cancer of breast ([place]   01/20/2015 Initial Diagnosis    Cancer of breast ([facility]       Malignant neoplasm of overlapping sites of right breast ([place]   09/12/2015 Initial Diagnosis    Malignant neoplasm of overlapping sites of right breast ([facility]       Malignant neoplasm of lower-inner quadrant of left breast in female, estrogen receptor negative ([place]    Initial Diagnosis    Malignant neoplasm of lower-inner quadrant of left breast in female, estrogen receptor negative ([place]      Carcinoma of lower-inner quadrant of left breast in female, estrogen receptor positive ([place]   01/05/2016 Initial Diagnosis    Carcinoma of lower-inner quadrant of left breast in female, estrogen receptor positive ([place]        INTERVAL HISTORY:   Hayley Don89y.o.  female pleasant patient above history of Metastatic ER/PR positive HER-2/neu negative breast cancer currently on ibrance plus Faslodex is here for follow to review the results of her CT scan.  Patient's right shoulder pain has improved since starting the narcotic pain  medication. Patient has been taking 1-2 pills a day.  Denies any tingling and numbness. Denies any pain. Denies any diarrhea. Denies any nausea vomiting. No shortness of breath or chest pain. No cough. She is fairly active. Denies any hot flashes.  REVIEW OF SYSTEMS:  A complete 10 point review of system is done which is negative except mentioned above/history of present illness.   PAST MEDICAL HISTORY :  Past Medical History:  Diagnosis Date  . Arthritis   . Breast cancer ([place] 17[place] years ago   Right Breast c radiation -- 2015 Mastectomy  . Cancer of breast ([place] 01/[place]  . HOH (hard of hearing)    AIDS  . Hypertension   . Malignant neoplasm of lower-inner quadrant of left breast in female, estrogen receptor negative ([place]     PAST SURGICAL HISTORY :   Past Surgical History:  Procedure Laterality Date  . ABDOMINAL HYSTERECTOMY    . BREAST LUMPECTOMY Right 1998   c Radiation  . CATARACT EXTRACTION W/PHACO Left 08/08/2015   Procedure: CATARACT EXTRACTION PHACO AND INTRAOCULAR LENS PLACEMENT (IOC);  Surgeon: CLeandrew Koyanagi MD;  Location: [facility] ORS;  Service: Ophthalmology;  Laterality: Left;  [place]58.0 AP% 14.9 CDE 8.60 Fluid Pack lot # [id number]  . CATARACT EXTRACTION W/PHACO Right 12/28/2015   Procedure: CATARACT EXTRACTION PHACO AND INTRAOCULAR LENS PLACEMENT (IOC);  Surgeon: CLeandrew Koyanagi MD;  [place] [facility]  Service: Ophthalmology;  Laterality: Right;  requests early  . JOINT REPLACEMENT Right 2008   knee  . MASTECTOMY Right 2015   After screening in May 2015 - Pt choice    FAMILY HISTORY :  No family history on file.  SOCIAL HISTORY:   Social History  Substance Use Topics  . Smoking status: Never Smoker  . Smokeless tobacco: Never Used  . Alcohol use No    ALLERGIES:  is allergic to tetracyclines & related.  MEDICATIONS:  Current Outpatient Prescriptions  Medication Sig Dispense Refill  . ALPHA LIPOIC ACID PO Take by mouth daily.     . calcium carbonate (TUMS - DOSED IN MG ELEMENTAL CALCIUM) 500 MG chewable tablet     . Cholecalciferol (VITAMIN D3 PO) Take 4,000 Units by mouth 2 (two) times daily.    . Coenzyme Q10 (CO Q 10 PO) Take by mouth daily.    . Glucosamine-Chondroitin (GLUCOSAMINE CHONDR COMPLEX PO) Take by mouth daily.    Marland Kitchen HYDROcodone-acetaminophen (NORCO/VICODIN) 5-325 MG tablet Take 1 tablet by mouth every 6 (six) hours as needed for moderate pain. 40 tablet 0  . lisinopril (PRINIVIL,ZESTRIL) 5 MG tablet Take by mouth.    . Melatonin 5 MG/15ML LIQD     . palbociclib (IBRANCE) 100 MG capsule Take 1 capsule (100 mg total) by mouth daily with breakfast. Take whole with food. Take for 3 weeks, then 1 week off. 21 capsule 3  . RESVERATROL PO Take by mouth daily.     No current facility-administered medications for this visit.     PHYSICAL EXAMINATION: ECOG PERFORMANCE STATUS: 0 - Asymptomatic  There were no vitals taken for this visit.  There were no vitals filed for this visit.  GENERAL: Well-nourished well-developed; Alert, no distress and comfortable.  Alone.  EYES: no pallor or icterus OROPHARYNX: no thrush or ulceration; good dentition  NECK: supple, right supraclavicular adenopathy felt.  LYMPH:  no palpable lymphadenopathy in the cervical, axillary or inguinal regions LUNGS: clear to auscultation and  No wheeze or crackles HEART/CVS: regular rate & rhythm and no murmurs; No lower extremity edema ABDOMEN:abdomen soft, non-tender and normal bowel sounds Musculoskeletal:no cyanosis of digits and no clubbing  PSYCH: alert & oriented x 3 with fluent speech NEURO: no focal motor/sensory deficits SKIN:  no rashes or significant lesions  LABORATORY DATA:  I have reviewed the data as listed    Component Value Date/Time   NA 138 02/02/2016 1015   K 3.9 02/02/2016 1015   CL 103 02/02/2016 1015   CO2 29 02/02/2016 1015   GLUCOSE 84 02/02/2016 1015   BUN 14 02/02/2016 1015   CREATININE 0.71  02/02/2016 1015   CALCIUM 8.9 02/02/2016 1015   PROT 6.5 02/02/2016 1015   ALBUMIN 4.1 02/02/2016 1015   AST 22 02/02/2016 1015   ALT 21 02/02/2016 1015   ALKPHOS 36 (L) 02/02/2016 1015   BILITOT 0.6 02/02/2016 1015   GFRNONAA >60 02/02/2016 1015   GFRAA >60 02/02/2016 1015    No results found for: SPEP, UPEP  Lab Results  Component Value Date   WBC 2.3 (L) 02/15/2016   NEUTROABS 1.4 02/02/2016   HGB 12.8 02/15/2016   HCT 37.3 02/15/2016   MCV 104.4 (H) 02/15/2016   PLT 180 02/15/2016      Chemistry      Component Value Date/Time   NA 138 02/02/2016 1015   K 3.9 02/02/2016 1015   CL 103 02/02/2016 1015   CO2 29 02/02/2016 1015   BUN 14  02/02/2016 1015   CREATININE 0.71 02/02/2016 1015      Component Value Date/Time   CALCIUM 8.9 02/02/2016 1015   ALKPHOS 36 (L) 02/02/2016 1015   AST 22 02/02/2016 1015   ALT 21 02/02/2016 1015   BILITOT 0.6 02/02/2016 1015     IMPRESSION: 1. Stable appearance of bilateral lower lobe pulmonary metastases. 2. Decrease in size of right axillary nodal metastasis. The right supraclavicular and prevascular index nodes are no longer measurable. 3. Aortic atherosclerosis   Electronically Signed   By: Kerby Moors M.D.   On: 12/05/2015 10:33  RADIOGRAPHIC STUDIES: I have personally reviewed the radiological images as listed and agreed with the findings in the report. No results found.   ASSESSMENT & PLAN:  No problem-specific Assessment & Plan notes found for this encounter.   No orders of the defined types were placed in this encounter.  # I reviewed the blood work- with the patient in detail; also reviewed the imaging independently [as summarized above]; and with the patient in detail.       Cammie Sickle, MD 03/08/2016 8:42 AM

## 2016-03-14 ENCOUNTER — Inpatient Hospital Stay: Payer: Medicare HMO

## 2016-03-14 ENCOUNTER — Inpatient Hospital Stay: Payer: Medicare HMO | Attending: Internal Medicine | Admitting: Internal Medicine

## 2016-03-14 VITALS — BP 166/67 | HR 86 | Temp 97.5°F | Wt 115.0 lb

## 2016-03-14 DIAGNOSIS — I10 Essential (primary) hypertension: Secondary | ICD-10-CM | POA: Diagnosis not present

## 2016-03-14 DIAGNOSIS — Z923 Personal history of irradiation: Secondary | ICD-10-CM | POA: Diagnosis not present

## 2016-03-14 DIAGNOSIS — C7951 Secondary malignant neoplasm of bone: Secondary | ICD-10-CM | POA: Diagnosis not present

## 2016-03-14 DIAGNOSIS — C50312 Malignant neoplasm of lower-inner quadrant of left female breast: Secondary | ICD-10-CM

## 2016-03-14 DIAGNOSIS — M129 Arthropathy, unspecified: Secondary | ICD-10-CM | POA: Insufficient documentation

## 2016-03-14 DIAGNOSIS — C773 Secondary and unspecified malignant neoplasm of axilla and upper limb lymph nodes: Secondary | ICD-10-CM | POA: Diagnosis not present

## 2016-03-14 DIAGNOSIS — Z9011 Acquired absence of right breast and nipple: Secondary | ICD-10-CM | POA: Diagnosis not present

## 2016-03-14 DIAGNOSIS — C7802 Secondary malignant neoplasm of left lung: Secondary | ICD-10-CM | POA: Insufficient documentation

## 2016-03-14 DIAGNOSIS — I700 Atherosclerosis of aorta: Secondary | ICD-10-CM | POA: Insufficient documentation

## 2016-03-14 DIAGNOSIS — I890 Lymphedema, not elsewhere classified: Secondary | ICD-10-CM | POA: Insufficient documentation

## 2016-03-14 DIAGNOSIS — R202 Paresthesia of skin: Secondary | ICD-10-CM | POA: Diagnosis not present

## 2016-03-14 DIAGNOSIS — Z170 Estrogen receptor positive status [ER+]: Principal | ICD-10-CM

## 2016-03-14 DIAGNOSIS — C7801 Secondary malignant neoplasm of right lung: Secondary | ICD-10-CM | POA: Diagnosis not present

## 2016-03-14 DIAGNOSIS — Z79818 Long term (current) use of other agents affecting estrogen receptors and estrogen levels: Secondary | ICD-10-CM | POA: Insufficient documentation

## 2016-03-14 DIAGNOSIS — R200 Anesthesia of skin: Secondary | ICD-10-CM | POA: Diagnosis not present

## 2016-03-14 DIAGNOSIS — C50811 Malignant neoplasm of overlapping sites of right female breast: Secondary | ICD-10-CM

## 2016-03-14 LAB — CBC WITH DIFFERENTIAL/PLATELET
BASOS ABS: 0 10*3/uL (ref 0–0.1)
BASOS PCT: 1 %
EOS ABS: 0.1 10*3/uL (ref 0–0.7)
EOS PCT: 3 %
HCT: 36.1 % (ref 35.0–47.0)
Hemoglobin: 12.6 g/dL (ref 12.0–16.0)
Lymphocytes Relative: 18 %
Lymphs Abs: 0.5 10*3/uL — ABNORMAL LOW (ref 1.0–3.6)
MCH: 36.6 pg — ABNORMAL HIGH (ref 26.0–34.0)
MCHC: 35 g/dL (ref 32.0–36.0)
MCV: 104.5 fL — ABNORMAL HIGH (ref 80.0–100.0)
MONO ABS: 0.3 10*3/uL (ref 0.2–0.9)
Monocytes Relative: 9 %
Neutro Abs: 1.9 10*3/uL (ref 1.4–6.5)
Neutrophils Relative %: 69 %
PLATELETS: 194 10*3/uL (ref 150–440)
RBC: 3.46 MIL/uL — AB (ref 3.80–5.20)
RDW: 16.1 % — ABNORMAL HIGH (ref 11.5–14.5)
WBC: 2.8 10*3/uL — AB (ref 3.6–11.0)

## 2016-03-14 LAB — COMPREHENSIVE METABOLIC PANEL
ALT: 21 U/L (ref 14–54)
AST: 23 U/L (ref 15–41)
Albumin: 4 g/dL (ref 3.5–5.0)
Alkaline Phosphatase: 36 U/L — ABNORMAL LOW (ref 38–126)
Anion gap: 8 (ref 5–15)
BUN: 22 mg/dL — AB (ref 6–20)
CHLORIDE: 101 mmol/L (ref 101–111)
CO2: 26 mmol/L (ref 22–32)
CREATININE: 0.89 mg/dL (ref 0.44–1.00)
Calcium: 9.3 mg/dL (ref 8.9–10.3)
GFR calc Af Amer: >60 mL/min (ref >60)
GFR, EST NON AFRICAN AMERICAN: 60 mL/min — AB (ref >60)
Glucose, Bld: 88 mg/dL (ref 65–99)
Potassium: 4.1 mmol/L (ref 3.5–5.1)
SODIUM: 135 mmol/L (ref 135–145)
Total Bilirubin: 0.6 mg/dL (ref 0.3–1.2)
Total Protein: 6.6 g/dL (ref 6.5–8.1)

## 2016-03-14 MED ORDER — FULVESTRANT 250 MG/5ML IM SOLN
500.0000 mg | Freq: Once | INTRAMUSCULAR | Status: AC
  Administered 2016-03-14: 500 mg via INTRAMUSCULAR
  Filled 2016-03-14: qty 10

## 2016-03-14 NOTE — Progress Notes (Signed)
[place] OFFICE PROGRESS NOTE  Patient Care Team: Lenard Simmer, MD as PCP - General (Endocrinology)  Cancer of breast [facility])   Staging form: Breast, AJCC 7th Edition     Clinical: Stage IV (T4, N1, M1) - Signed by Forest Gleason, MD on 01/20/2015    Oncology History   # June 2015-RIGHT BREAST CA ? Recurrent- IMC [nipple inverted; T4-skin invol; s/p Mastec; No LN; Dr.Smith ]; ER/PR- Pos 51-90%;Her 2 neu-NEG;Declined RT/Anti-hormone;   #  STAGE IV- Nov 2016- IMC ER-90%; PR-51-90%; her 2 neu-NEG [right ax LN Bx] NOV 2016- IBRANCE + Letrozole; April 2017- PET- RLL/LLL- progression; Supraclav/Ax LN- STABLE/TM- rising;June 20th 2017 FASLODEX+ Ibrance 149m; OCT 2nd- CT C/A/P- STABLE pul mets.   # NOV CT Neck- Progression in brachial plexus area-  RT [s/p RT- middec 2017]  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 [[facility] s/p lumpec;s/p Tam x9 M RT]  # 2015- R UE Lymphedema on Sleeve  # Jan 2018- check MMR     Cancer of breast ([place]   01/20/2015 Initial Diagnosis    Cancer of breast ([facility]       Malignant neoplasm of overlapping sites of right breast ([place]   09/12/2015 Initial Diagnosis    Malignant neoplasm of overlapping sites of right breast ([facility]       Carcinoma of lower-inner quadrant of left breast in female, estrogen receptor positive ([place]   01/05/2016 Initial Diagnosis    Carcinoma of lower-inner quadrant of left breast in female, estrogen receptor positive ([place]        INTERVAL HISTORY:   Hayley Don89y.o.  female pleasant patient above history of Metastatic ER/PR positive HER-2/neu negative breast cancer currently on ibrance plus Faslodex ; Oligometastatic progression in the brachial plexus right shoulder status post radiation is here for follow-up.  Patient's pain is improved. She is not currently taking any pain medication. Patient admits to tingling and numbness of her fingertips and the right hand.  Denies any diarrhea. Denies any nausea vomiting. No  shortness of breath or chest pain. No cough. She is fairly active. Denies any hot flashes.  REVIEW OF SYSTEMS:  A complete 10 point review of system is done which is negative except mentioned above/history of present illness.   PAST MEDICAL HISTORY :  Past Medical History:  Diagnosis Date  . Arthritis   . Breast cancer ([place] 17[place] years ago   Right Breast c radiation -- 2015 Mastectomy  . Cancer of breast ([facility] 01/20/2015  . HOH (hard of hearing)    AIDS  . Hypertension   . Malignant neoplasm of lower-inner quadrant of left breast in female, estrogen receptor negative ([facility]     PAST SURGICAL HISTORY :   Past Surgical History:  Procedure Laterality Date  . ABDOMINAL HYSTERECTOMY    . BREAST LUMPECTOMY Right 1998   c Radiation  . CATARACT EXTRACTION W/PHACO Left 08/08/2015   Procedure: CATARACT EXTRACTION PHACO AND INTRAOCULAR LENS PLACEMENT (IOC);  Surgeon: CLeandrew Koyanagi MD;  Location: [facility] ORS;  Service: Ophthalmology;  Laterality: Left;  [place]58.0 AP% 14.9 CDE 8.60 Fluid Pack lot # [id number]  . CATARACT EXTRACTION W/PHACO Right 12/28/2015   Procedure: CATARACT EXTRACTION PHACO AND INTRAOCULAR LENS PLACEMENT (IOC);  Surgeon: CLeandrew Koyanagi MD;  [place] [facility]  Service: Ophthalmology;  Laterality: Right;  requests early  . JOINT REPLACEMENT Right 2008   knee  . MASTECTOMY Right 2015   After screening in May 2015 - Pt choice  FAMILY HISTORY :  No family history on file.  SOCIAL HISTORY:   Social History  Substance Use Topics  . Smoking status: Never Smoker  . Smokeless tobacco: Never Used  . Alcohol use No    ALLERGIES:  is allergic to tetracyclines & related.  MEDICATIONS:  Current Outpatient Prescriptions  Medication Sig Dispense Refill  . ALPHA LIPOIC ACID PO Take by mouth daily.    . calcium carbonate (TUMS - DOSED IN MG ELEMENTAL CALCIUM) 500 MG chewable tablet     . Cholecalciferol (VITAMIN D3 PO) Take 4,000 Units by mouth 2 (two)  times daily.    . Coenzyme Q10 (CO Q 10 PO) Take by mouth daily.    . Glucosamine-Chondroitin (GLUCOSAMINE CHONDR COMPLEX PO) Take by mouth daily.    Marland Kitchen lisinopril (PRINIVIL,ZESTRIL) 5 MG tablet Take 5 mg by mouth daily.     . Melatonin 5 MG/15ML LIQD Take 5 mg by mouth at bedtime as needed.     . palbociclib (IBRANCE) 100 MG capsule Take 1 capsule (100 mg total) by mouth daily with breakfast. Take whole with food. Take for 3 weeks, then 1 week off. 21 capsule 3  . RESVERATROL PO Take by mouth daily.     No current facility-administered medications for this visit.     PHYSICAL EXAMINATION: ECOG PERFORMANCE STATUS: 0 - Asymptomatic  BP (!) 166/67 (BP Location: Left Arm, Patient Position: Sitting)   Pulse 86   Temp 97.5 F (36.4 C) (Tympanic)   Wt 115 lb (52.2 kg)   BMI 19.14 kg/m   Filed Weights   03/14/16 1130  Weight: 115 lb (52.2 kg)    GENERAL: Well-nourished well-developed; Alert, no distress and comfortable.  Alone.  EYES: no pallor or icterus OROPHARYNX: no thrush or ulceration; good dentition  NECK: supple, right supraclavicular adenopathy felt.  LYMPH:  no palpable lymphadenopathy in the cervical, axillary or inguinal regions LUNGS: clear to auscultation and  No wheeze or crackles HEART/CVS: regular rate & rhythm and no murmurs; No lower extremity edema ABDOMEN:abdomen soft, non-tender and normal bowel sounds Musculoskeletal:no cyanosis of digits and no clubbing  PSYCH: alert & oriented x 3 with fluent speech NEURO: no focal motor/sensory deficits SKIN:  no rashes or significant lesions  LABORATORY DATA:  I have reviewed the data as listed    Component Value Date/Time   NA 135 03/14/2016 1055   K 4.1 03/14/2016 1055   CL 101 03/14/2016 1055   CO2 26 03/14/2016 1055   GLUCOSE 88 03/14/2016 1055   BUN 22 (H) 03/14/2016 1055   CREATININE 0.89 03/14/2016 1055   CALCIUM 9.3 03/14/2016 1055   PROT 6.6 03/14/2016 1055   ALBUMIN 4.0 03/14/2016 1055   AST 23  03/14/2016 1055   ALT 21 03/14/2016 1055   ALKPHOS 36 (L) 03/14/2016 1055   BILITOT 0.6 03/14/2016 1055   GFRNONAA 60 (L) 03/14/2016 1055   GFRAA >60 03/14/2016 1055    No results found for: SPEP, UPEP  Lab Results  Component Value Date   WBC 2.8 (L) 03/14/2016   NEUTROABS 1.9 03/14/2016   HGB 12.6 03/14/2016   HCT 36.1 03/14/2016   MCV 104.5 (H) 03/14/2016   PLT 194 03/14/2016      Chemistry      Component Value Date/Time   NA 135 03/14/2016 1055   K 4.1 03/14/2016 1055   CL 101 03/14/2016 1055   CO2 26 03/14/2016 1055   BUN 22 (H) 03/14/2016 1055   CREATININE 0.89 03/14/2016  1055      Component Value Date/Time   CALCIUM 9.3 03/14/2016 1055   ALKPHOS 36 (L) 03/14/2016 1055   AST 23 03/14/2016 1055   ALT 21 03/14/2016 1055   BILITOT 0.6 03/14/2016 1055     IMPRESSION: 1. Stable appearance of bilateral lower lobe pulmonary metastases. 2. Decrease in size of right axillary nodal metastasis. The right supraclavicular and prevascular index nodes are no longer measurable. 3. Aortic atherosclerosis   Electronically Signed   By: Kerby Moors M.D.   On: 12/05/2015 10:33  RADIOGRAPHIC STUDIES: I have personally reviewed the radiological images as listed and agreed with the findings in the report. No results found.   ASSESSMENT & PLAN:  Carcinoma of lower-inner quadrant of left breast in female, estrogen receptor positive ([place]) Metastatic ER/PR positive HER-2/neu negative breast cancer currently on Faslodex /ibrance- DEC 4th CT- stable bil pul nodules; worsened right Axillary lesion- s/p palliative RT- pain improved. Labs- reviewed;ANC- 1.9; Hb-11/ platelets- N.   # Discussed with the patient that if she continues to have progressive pain or further progression on imaging.  she will need switching of therapies to other options including chemotherapy versus afinitor + Aromasin. Pt declines chemotherapy. Check MMR.   # Reviewed/counselled regarding the goals of  care- being palliative/treatment are usually indefinite-until progression or side effects. Goal is to maintain quality of life as the disease is incurable.   # Right UE lymphedema- ? Worsened- ? Malignancy vs RT- continue pump/sleeve; if worse recommend PT eval.   # will follow up in 4 weeks/ shots/ will order PEt scan at next visit.    Orders Placed This Encounter  Procedures  . Comprehensive metabolic panel    Standing Status:   Future    Standing Expiration Date:   03/14/2017  . CBC with Differential    Standing Status:   Future    Standing Expiration Date:   03/14/2017  . Cancer antigen 27.29    Standing Status:   Future    Standing Expiration Date:   03/14/2017   # I reviewed the blood work- with the patient in detail; also reviewed the imaging independently [as summarized above]; and with the patient in detail.       Cammie Sickle, MD 03/15/2016 4:52 PM

## 2016-03-14 NOTE — Progress Notes (Signed)
Patient here today for follow up.  Patient states no new concerns today  

## 2016-03-14 NOTE — Assessment & Plan Note (Addendum)
Metastatic ER/PR positive HER-2/neu negative breast cancer currently on Faslodex /ibrance- DEC 4th CT- stable bil pul nodules; worsened right Axillary lesion- s/p palliative RT- pain improved. Labs- reviewed;ANC- 1.9; Hb-11/ platelets- N.   # Discussed with the patient that if she continues to have progressive pain or further progression on imaging.  she will need switching of therapies to other options including chemotherapy versus afinitor + Aromasin. Pt declines chemotherapy. Check MMR.   # Reviewed/counselled regarding the goals of care- being palliative/treatment are usually indefinite-until progression or side effects. Goal is to maintain quality of life as the disease is incurable.   # Right UE lymphedema- ? Worsened- ? Malignancy vs RT- continue pump/sleeve; if worse recommend PT eval.   # will follow up in 4 weeks/ shots/ will order PEt scan at next visit.

## 2016-03-20 LAB — SURGICAL PATHOLOGY

## 2016-03-22 ENCOUNTER — Ambulatory Visit: Payer: Medicare HMO | Admitting: Radiation Oncology

## 2016-04-11 ENCOUNTER — Ambulatory Visit
Admission: RE | Admit: 2016-04-11 | Discharge: 2016-04-11 | Disposition: A | Discharge status: Home / Self Care | Payer: Medicare HMO | Source: Ambulatory Visit | Attending: Radiation Oncology | Admitting: Radiation Oncology

## 2016-04-11 ENCOUNTER — Inpatient Hospital Stay: Payer: Medicare HMO

## 2016-04-11 ENCOUNTER — Inpatient Hospital Stay: Payer: Medicare HMO | Attending: Internal Medicine | Admitting: Internal Medicine

## 2016-04-11 VITALS — BP 159/69 | HR 77 | Temp 96.6°F | Wt 116.3 lb

## 2016-04-11 VITALS — BP 162/63 | HR 80 | Temp 97.2°F | Wt 116.0 lb

## 2016-04-11 DIAGNOSIS — Z170 Estrogen receptor positive status [ER+]: Principal | ICD-10-CM

## 2016-04-11 DIAGNOSIS — I890 Lymphedema, not elsewhere classified: Secondary | ICD-10-CM | POA: Diagnosis not present

## 2016-04-11 DIAGNOSIS — C50312 Malignant neoplasm of lower-inner quadrant of left female breast: Secondary | ICD-10-CM | POA: Diagnosis not present

## 2016-04-11 DIAGNOSIS — C090 Malignant neoplasm of tonsillar fossa: Secondary | ICD-10-CM | POA: Diagnosis not present

## 2016-04-11 DIAGNOSIS — Z853 Personal history of malignant neoplasm of breast: Secondary | ICD-10-CM | POA: Diagnosis not present

## 2016-04-11 DIAGNOSIS — Z79899 Other long term (current) drug therapy: Secondary | ICD-10-CM

## 2016-04-11 DIAGNOSIS — I700 Atherosclerosis of aorta: Secondary | ICD-10-CM | POA: Diagnosis not present

## 2016-04-11 DIAGNOSIS — Z79818 Long term (current) use of other agents affecting estrogen receptors and estrogen levels: Secondary | ICD-10-CM | POA: Diagnosis not present

## 2016-04-11 DIAGNOSIS — C7801 Secondary malignant neoplasm of right lung: Secondary | ICD-10-CM | POA: Diagnosis not present

## 2016-04-11 DIAGNOSIS — Z9013 Acquired absence of bilateral breasts and nipples: Secondary | ICD-10-CM

## 2016-04-11 DIAGNOSIS — M129 Arthropathy, unspecified: Secondary | ICD-10-CM | POA: Diagnosis not present

## 2016-04-11 DIAGNOSIS — C7802 Secondary malignant neoplasm of left lung: Secondary | ICD-10-CM | POA: Insufficient documentation

## 2016-04-11 DIAGNOSIS — I10 Essential (primary) hypertension: Secondary | ICD-10-CM | POA: Insufficient documentation

## 2016-04-11 DIAGNOSIS — C50811 Malignant neoplasm of overlapping sites of right female breast: Secondary | ICD-10-CM

## 2016-04-11 LAB — CBC WITH DIFFERENTIAL/PLATELET
BASOS ABS: 0 10*3/uL (ref 0–0.1)
Basophils Relative: 2 %
EOS ABS: 0.1 10*3/uL (ref 0–0.7)
Eosinophils Relative: 2 %
HEMATOCRIT: 36.5 % (ref 35.0–47.0)
HEMOGLOBIN: 12.7 g/dL (ref 12.0–16.0)
Lymphocytes Relative: 17 %
Lymphs Abs: 0.5 10*3/uL — ABNORMAL LOW (ref 1.0–3.6)
MCH: 36.7 pg — ABNORMAL HIGH (ref 26.0–34.0)
MCHC: 34.8 g/dL (ref 32.0–36.0)
MCV: 105.6 fL — ABNORMAL HIGH (ref 80.0–100.0)
MONO ABS: 0.2 10*3/uL (ref 0.2–0.9)
MONOS PCT: 9 %
NEUTROS ABS: 1.9 10*3/uL (ref 1.4–6.5)
NEUTROS PCT: 70 %
Platelets: 198 10*3/uL (ref 150–440)
RBC: 3.45 MIL/uL — ABNORMAL LOW (ref 3.80–5.20)
RDW: 16.8 % — AB (ref 11.5–14.5)
WBC: 2.7 10*3/uL — ABNORMAL LOW (ref 3.6–11.0)

## 2016-04-11 LAB — COMPREHENSIVE METABOLIC PANEL
ALK PHOS: 38 U/L (ref 38–126)
ALT: 21 U/L (ref 14–54)
ANION GAP: 6 (ref 5–15)
AST: 27 U/L (ref 15–41)
Albumin: 4.2 g/dL (ref 3.5–5.0)
BILIRUBIN TOTAL: 0.8 mg/dL (ref 0.3–1.2)
BUN: 17 mg/dL (ref 6–20)
CALCIUM: 9.1 mg/dL (ref 8.9–10.3)
CO2: 27 mmol/L (ref 22–32)
CREATININE: 0.79 mg/dL (ref 0.44–1.00)
Chloride: 101 mmol/L (ref 101–111)
GFR calc non Af Amer: >60 mL/min (ref >60)
Glucose, Bld: 99 mg/dL (ref 65–99)
Potassium: 3.9 mmol/L (ref 3.5–5.1)
Sodium: 134 mmol/L — ABNORMAL LOW (ref 135–145)
TOTAL PROTEIN: 6.8 g/dL (ref 6.5–8.1)

## 2016-04-11 MED ORDER — FULVESTRANT 250 MG/5ML IM SOLN
500.0000 mg | Freq: Once | INTRAMUSCULAR | Status: AC
  Administered 2016-04-11: 500 mg via INTRAMUSCULAR
  Filled 2016-04-11: qty 10

## 2016-04-11 NOTE — Assessment & Plan Note (Addendum)
Metastatic ER/PR positive HER-2/neu negative breast cancer currently on Faslodex /ibrance- DEC 4th CT- stable bil pul nodules; worsened right Axillary lesion- s/p palliative RT- pain improved. Labs- reviewed;WBC- 2.3; ANC- 1.9; Hb-12/ platelets- N.   # Discussed with the patient that if she continues to have progressive pain or further progression on imaging. Await PET scan.   Discussed re: affinitor + Aromasin. I discussed the potential side effects including but not limited to mucositis skin rash and diarrhea with Afinitor. Pt declines chemotherapy.   # Right UE lymphedema- ? Worsened; not better- continue pump/sleeve;reluctant for PT eval.   # will follow up in March 19th week/labs/ shots/ PET scan few days prior.

## 2016-04-11 NOTE — Progress Notes (Signed)
Patient here today for follow up.   

## 2016-04-11 NOTE — Progress Notes (Signed)
Radiation Oncology Follow up Note  Name: Hayley [name]   Date:   04/11/2016 MRN:  [id number] DOB: 11-12-1933    This 83 y.o. female presents to the clinic today for one-month follow-up status post palliative radiation therapy to her right supraclavicular fossa for metastatic invasive mammary carcinoma.Marland Kitchen  REFERRING PROVIDER: Lenard Simmer, MD  HPI: Patient is a 83 year old female now out 1 month having received palliative radiation therapy to her right supraclavicular fossa for metastatic invasive mammary carcinoma causing narcotic dependent pain in her right upper extremity and neck. She is seen today in routine follow-up and is really doing well. She still has some numbness in her fingers although pain has almost completely resolved.. She is currently not on narcotic analgesics. She is seeing medical oncology today for further treatment plan.  COMPLICATIONS OF TREATMENT: none  FOLLOW UP COMPLIANCE: keeps appointments   PHYSICAL EXAM:  BP (!) 159/69   Pulse 77   Temp (!) 96.6 F (35.9 C)   Wt 116 lb 4.7 oz (52.7 kg)   BMI 19.35 kg/m  There some firmness in right supraclavicular fossa. Motor sensory and DTR levels are equal and symmetric in the upper extremities. No pain is elicited on range of motion of her right upper extremity. Well-developed well-nourished patient in NAD. HEENT reveals PERLA, EOMI, discs not visualized.  Oral cavity is clear. No oral mucosal lesions are identified. Neck is clear without evidence of cervical or supraclavicular adenopathy. Lungs are clear to A&P. Cardiac examination is essentially unremarkable with regular rate and rhythm without murmur rub or thrill. Abdomen is benign with no organomegaly or masses noted. Motor sensory and DTR levels are equal and symmetric in the upper and lower extremities. Cranial nerves II through XII are grossly intact. Proprioception is intact. No peripheral adenopathy or edema is identified. No motor or sensory levels are  noted. Crude visual fields are within normal range.  RADIOLOGY RESULTS: No current films for review  PLAN: At this time patient is achieved excellent palliative benefit from radiation therapy. I'll reevaluate her in 4-5 months for follow-up. Patient knows to call with any concerns. Again she is seeing medical oncology today for further treatment plan.  I would like to take this opportunity to thank you for allowing me to participate in the care of your patient.Armstead Peaks., MD

## 2016-04-11 NOTE — Progress Notes (Signed)
[place] OFFICE PROGRESS NOTE  Patient Care Team: Lenard Simmer, MD as PCP - General (Endocrinology)  Cancer of breast [facility])   Staging form: Breast, AJCC 7th Edition     Clinical: Stage IV (T4, N1, M1) - Signed by Forest Gleason, MD on 01/20/2015    Oncology History   # June 2015-RIGHT BREAST CA ? Recurrent- IMC [nipple inverted; T4-skin invol; s/p Mastec; No LN; Dr.Smith ]; ER/PR- Pos 51-90%;Her 2 neu-NEG;Declined RT/Anti-hormone;   #  STAGE IV- Nov 2016- IMC ER-90%; PR-51-90%; her 2 neu-NEG [right ax LN Bx] NOV 2016- IBRANCE + Letrozole; April 2017- PET- RLL/LLL- progression; Supraclav/Ax LN- STABLE/TM- rising;June 20th 2017 FASLODEX+ Ibrance 119m; OCT 2nd- CT C/A/P- STABLE pul mets.   # NOV CT Neck- Progression in brachial plexus area-  RT [s/p RT- middec 2017]  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 [[facility] s/p lumpec;s/p Tam x9 M RT]  # 2015- R UE Lymphedema on Sleeve  # Jan 2018- MMR-STABLE.      Cancer of breast ([facility]   01/20/2015 Initial Diagnosis    Cancer of breast ([facility]       Malignant neoplasm of overlapping sites of right breast ([facility]   09/12/2015 Initial Diagnosis    Malignant neoplasm of overlapping sites of right breast ([facility]       Carcinoma of lower-inner quadrant of left breast in female, estrogen receptor positive ([facility]   01/05/2016 Initial Diagnosis    Carcinoma of lower-inner quadrant of left breast in female, estrogen receptor positive ([facility]        INTERVAL HISTORY:   [name].  female pleasant patient above history of Metastatic ER/PR positive HER-2/neu negative breast cancer currently on ibrance plus Faslodex ; Oligometastatic progression in the brachial plexus right shoulder status post radiation is here for follow-up.  Denies any diarrhea. Denies any nausea vomiting. No shortness of breath or chest pain. No cough. She is fairly active. Denies any hot flashes. Patient's pain is improved. She is not currently taking any pain  medication. Patient admits to tingling and numbness of her right hand- middle 2 fingers.   REVIEW OF SYSTEMS:  A complete 10 point review of system is done which is negative except mentioned above/history of present illness.   PAST MEDICAL HISTORY :  Past Medical History:  Diagnosis Date  . Arthritis   . Breast cancer ([place] 17+ years ago   Right Breast c radiation -- 2015 Mastectomy  . Cancer of breast ([place] 01/20/2015  . HOH (hard of hearing)    AIDS  . Hypertension   . Malignant neoplasm of lower-inner quadrant of left breast in female, estrogen receptor negative ([facility] [place]     PAST SURGICAL HISTORY :   Past Surgical History:  Procedure Laterality Date  . ABDOMINAL HYSTERECTOMY    . BREAST LUMPECTOMY Right 1998   c Radiation  . CATARACT EXTRACTION W/PHACO Left 08/08/2015   Procedure: CATARACT EXTRACTION PHACO AND INTRAOCULAR LENS PLACEMENT (IOC);  Surgeon: CLeandrew Koyanagi MD;  Location: [facility] ORS;  Service: Ophthalmology;  Laterality: Left;  [place]58.[place] AP% 14.9 CDE 8.60 Fluid Pack lot # [id number]  . CATARACT EXTRACTION W/PHACO Right 12/28/2015   Procedure: CATARACT EXTRACTION PHACO AND INTRAOCULAR LENS PLACEMENT (IOC);  Surgeon: CLeandrew Koyanagi MD;  [place] [facility]  Service: Ophthalmology;  Laterality: Right;  requests early  . JOINT REPLACEMENT Right 2008   knee  . MASTECTOMY Right 2015   After screening in May 2015 - Pt choice  FAMILY HISTORY :  No family history on file.  SOCIAL HISTORY:   Social History  Substance Use Topics  . Smoking status: Never Smoker  . Smokeless tobacco: Never Used  . Alcohol use No    ALLERGIES:  is allergic to tetracyclines & related.  MEDICATIONS:  Current Outpatient Prescriptions  Medication Sig Dispense Refill  . ALPHA LIPOIC ACID PO Take by mouth daily.    . calcium carbonate (TUMS - DOSED IN MG ELEMENTAL CALCIUM) 500 MG chewable tablet     . Cholecalciferol (VITAMIN D3 PO) Take 4,000 Units by mouth 2 (two) times  daily.    . Coenzyme Q10 (CO Q 10 PO) Take by mouth daily.    . Glucosamine-Chondroitin (GLUCOSAMINE CHONDR COMPLEX PO) Take by mouth daily.    Marland Kitchen lisinopril (PRINIVIL,ZESTRIL) 5 MG tablet Take 5 mg by mouth daily.     . Melatonin 3 MG TABS Take 3 mg by mouth as needed.    . palbociclib (IBRANCE) 100 MG capsule Take 1 capsule (100 mg total) by mouth daily with breakfast. Take whole with food. Take for 3 weeks, then 1 week off. 21 capsule 3  . RESVERATROL PO Take by mouth daily.     No current facility-administered medications for this visit.     PHYSICAL EXAMINATION: ECOG PERFORMANCE STATUS: 0 - Asymptomatic  BP (!) 162/63 (BP Location: Left Arm, Patient Position: Sitting)   Pulse 80   Temp 97.2 F (36.2 C) (Tympanic)   Wt 116 lb (52.6 kg)   BMI 19.30 kg/m   Filed Weights   04/11/16 1129  Weight: 116 lb (52.6 kg)    GENERAL: Well-nourished well-developed; Alert, no distress and comfortable.  Alone.  EYES: no pallor or icterus OROPHARYNX: no thrush or ulceration; good dentition  NECK: supple, right supraclavicular adenopathy felt.  LYMPH:  no palpable lymphadenopathy in the cervical, axillary or inguinal regions LUNGS: clear to auscultation and  No wheeze or crackles HEART/CVS: regular rate & rhythm and no murmurs; No lower extremity edema ABDOMEN:abdomen soft, non-tender and normal bowel sounds Musculoskeletal:no cyanosis of digits and no clubbing  PSYCH: alert & oriented x 3 with fluent speech NEURO: no focal motor/sensory deficits SKIN:  no rashes or significant lesions  LABORATORY DATA:  I have reviewed the data as listed    Component Value Date/Time   NA 134 (L) 04/11/2016 1105   K 3.9 04/11/2016 1105   CL 101 04/11/2016 1105   CO2 27 04/11/2016 1105   GLUCOSE 99 04/11/2016 1105   BUN 17 04/11/2016 1105   CREATININE 0.79 04/11/2016 1105   CALCIUM 9.1 04/11/2016 1105   PROT 6.8 04/11/2016 1105   ALBUMIN 4.2 04/11/2016 1105   AST 27 04/11/2016 1105   ALT 21  04/11/2016 1105   ALKPHOS 38 04/11/2016 1105   BILITOT 0.8 04/11/2016 1105   GFRNONAA >60 04/11/2016 1105   GFRAA >60 04/11/2016 1105    No results found for: SPEP, UPEP  Lab Results  Component Value Date   WBC 2.7 (L) 04/11/2016   NEUTROABS 1.9 04/11/2016   HGB 12.7 04/11/2016   HCT 36.5 04/11/2016   MCV 105.6 (H) 04/11/2016   PLT 198 04/11/2016      Chemistry      Component Value Date/Time   NA 134 (L) 04/11/2016 1105   K 3.9 04/11/2016 1105   CL 101 04/11/2016 1105   CO2 27 04/11/2016 1105   BUN 17 04/11/2016 1105   CREATININE 0.79 04/11/2016 1105  Component Value Date/Time   CALCIUM 9.1 04/11/2016 1105   ALKPHOS 38 04/11/2016 1105   AST 27 04/11/2016 1105   ALT 21 04/11/2016 1105   BILITOT 0.8 04/11/2016 1105     IMPRESSION: 1. Stable appearance of bilateral lower lobe pulmonary metastases. 2. Decrease in size of right axillary nodal metastasis. The right supraclavicular and prevascular index nodes are no longer measurable. 3. Aortic atherosclerosis   Electronically Signed   By: Kerby Moors M.D.   On: 12/05/2015 10:33  RADIOGRAPHIC STUDIES: I have personally reviewed the radiological images as listed and agreed with the findings in the report. No results found.   ASSESSMENT & PLAN:  Carcinoma of lower-inner quadrant of left breast in female, estrogen receptor positive ([place]) Metastatic ER/PR positive HER-2/neu negative breast cancer currently on Faslodex /ibrance- DEC 4th CT- stable bil pul nodules; worsened right Axillary lesion- s/p palliative RT- pain improved. Labs- reviewed;WBC- 2.3; ANC- 1.9; Hb-12/ platelets- N.   # Discussed with the patient that if she continues to have progressive pain or further progression on imaging. Await PET scan.   Discussed re: affinitor + Aromasin. I discussed the potential side effects including but not limited to mucositis skin rash and diarrhea with Afinitor. Pt declines chemotherapy.   # Right UE lymphedema-  ? Worsened; not better- continue pump/sleeve;reluctant for PT eval.   # will follow up in March 19th week/labs/ shots/ PET scan few days prior.    Orders Placed This Encounter  Procedures  . NM PET Image Restag (PS) Skull Base To Thigh    Standing Status:   Future    Standing Expiration Date:   06/11/2017    Order Specific Question:   Reason for Exam (SYMPTOM  OR DIAGNOSIS REQUIRED)    Answer:   breast cancer    Order Specific Question:   Preferred imaging location?    Answer:   [facility]  . CBC with Differential    Standing Status:   Standing    Number of Occurrences:   20    Standing Expiration Date:   04/11/2017  . Comprehensive metabolic panel    Standing Status:   Standing    Number of Occurrences:   20    Standing Expiration Date:   04/11/2017  . Cancer antigen 27.29    Standing Status:   Standing    Number of Occurrences:   20    Standing Expiration Date:   04/11/2017   # I reviewed the blood work- with the patient in detail; also reviewed the imaging independently [as summarized above]; and with the patient in detail.       Cammie Sickle, MD 04/11/2016 1:15 PM

## 2016-04-12 LAB — CANCER ANTIGEN 27.29: CA 27.29: 70.8 U/mL — ABNORMAL HIGH (ref 0.0–38.6)

## 2016-05-03 ENCOUNTER — Telehealth: Payer: Self-pay | Admitting: *Deleted

## 2016-05-03 NOTE — Telephone Encounter (Signed)
Patient contacted regarding p[employer]izer application for her Hayley [name]. She will come in at 815 am to complete paperwork in morning.

## 2016-05-04 ENCOUNTER — Encounter: Payer: Self-pay | Admitting: *Deleted

## 2016-05-04 NOTE — Progress Notes (Signed)
Faxed completed ibrance financial assistance application/consents to [employer].

## 2016-05-10 ENCOUNTER — Telehealth: Payer: Self-pay | Admitting: *Deleted

## 2016-05-10 NOTE — Telephone Encounter (Signed)
Verne at [employer] patient assistance program.  Regarding Hayley [name] (dob: 05/20/1933). Application for [facility] was approved for pt's assitance. Caller has questions regardinag the Ibrance 100 mg prescription.  Approval for patient assistance is good through 03/04/17, then pt will need to reapply for pt's assistance.  Call back number at [phone].  Returned Verne's phone call at 1600.    Spoke with [employer] just wanted to make sure that the script they received on the ibrance application was written and faxed from our office. The handwriting on the application was different from the md signature. I explained that the information and prescription was correct and verified that the rx was written by our office.

## 2016-05-16 ENCOUNTER — Telehealth: Payer: Self-pay | Admitting: *Deleted

## 2016-05-16 NOTE — Telephone Encounter (Addendum)
Candise Bowens from [employer] [facility]. Twanda called asking for return phone call. Call returned. Patient has been referred over to the patient assistance program. Sheldon inquiring whether our office received the verification of patient's benefits form that was sent over on 05/04/16. I explained that this was not on the patient's chart and we did not receive this. However, on 3/8 Rn received a phone call from [employer] explaining patient was approved for pt assistance and they wanted to verify that the rx came from our office. Per [employer], this information will be documented on the pfizer access side and noted that pt was approved. I asked her if I needed to do anything else. She stated that is all she needed to know.

## 2016-05-21 ENCOUNTER — Ambulatory Visit: Payer: Medicare HMO

## 2016-05-22 ENCOUNTER — Ambulatory Visit
Admission: RE | Admit: 2016-05-22 | Discharge: 2016-05-22 | Disposition: A | Discharge status: Home / Self Care | Payer: Medicare HMO | Source: Ambulatory Visit | Attending: Internal Medicine | Admitting: Internal Medicine

## 2016-05-22 ENCOUNTER — Other Ambulatory Visit: Payer: Self-pay | Admitting: Internal Medicine

## 2016-05-22 DIAGNOSIS — Z170 Estrogen receptor positive status [ER+]: Secondary | ICD-10-CM | POA: Insufficient documentation

## 2016-05-22 DIAGNOSIS — C7989 Secondary malignant neoplasm of other specified sites: Secondary | ICD-10-CM | POA: Diagnosis not present

## 2016-05-22 DIAGNOSIS — C50312 Malignant neoplasm of lower-inner quadrant of left female breast: Secondary | ICD-10-CM | POA: Diagnosis present

## 2016-05-22 DIAGNOSIS — C7802 Secondary malignant neoplasm of left lung: Secondary | ICD-10-CM | POA: Diagnosis not present

## 2016-05-22 DIAGNOSIS — Z9011 Acquired absence of right breast and nipple: Secondary | ICD-10-CM | POA: Insufficient documentation

## 2016-05-22 DIAGNOSIS — C7801 Secondary malignant neoplasm of right lung: Secondary | ICD-10-CM | POA: Insufficient documentation

## 2016-05-22 LAB — GLUCOSE, CAPILLARY: Glucose-Capillary: 92 mg/dL (ref 65–99)

## 2016-05-22 MED ORDER — FLUDEOXYGLUCOSE F - 18 (FDG) INJECTION
12.7400 | Freq: Once | INTRAVENOUS | Status: AC | PRN
  Administered 2016-05-22: 12.74 via INTRAVENOUS

## 2016-05-23 ENCOUNTER — Inpatient Hospital Stay: Payer: Medicare HMO

## 2016-05-23 ENCOUNTER — Inpatient Hospital Stay (HOSPITAL_BASED_OUTPATIENT_CLINIC_OR_DEPARTMENT_OTHER): Payer: Medicare HMO | Admitting: Internal Medicine

## 2016-05-23 ENCOUNTER — Inpatient Hospital Stay: Payer: Medicare HMO | Attending: Internal Medicine

## 2016-05-23 VITALS — BP 156/78 | HR 94 | Temp 97.6°F | Resp 18 | Ht 65.0 in | Wt 113.2 lb

## 2016-05-23 DIAGNOSIS — I890 Lymphedema, not elsewhere classified: Secondary | ICD-10-CM | POA: Insufficient documentation

## 2016-05-23 DIAGNOSIS — I10 Essential (primary) hypertension: Secondary | ICD-10-CM | POA: Insufficient documentation

## 2016-05-23 DIAGNOSIS — C50312 Malignant neoplasm of lower-inner quadrant of left female breast: Secondary | ICD-10-CM

## 2016-05-23 DIAGNOSIS — C7800 Secondary malignant neoplasm of unspecified lung: Secondary | ICD-10-CM

## 2016-05-23 DIAGNOSIS — Z9011 Acquired absence of right breast and nipple: Secondary | ICD-10-CM | POA: Diagnosis not present

## 2016-05-23 DIAGNOSIS — I700 Atherosclerosis of aorta: Secondary | ICD-10-CM | POA: Diagnosis not present

## 2016-05-23 DIAGNOSIS — Z170 Estrogen receptor positive status [ER+]: Secondary | ICD-10-CM | POA: Diagnosis not present

## 2016-05-23 DIAGNOSIS — C50811 Malignant neoplasm of overlapping sites of right female breast: Secondary | ICD-10-CM

## 2016-05-23 DIAGNOSIS — Z79899 Other long term (current) drug therapy: Secondary | ICD-10-CM | POA: Diagnosis not present

## 2016-05-23 DIAGNOSIS — K219 Gastro-esophageal reflux disease without esophagitis: Secondary | ICD-10-CM

## 2016-05-23 LAB — COMPREHENSIVE METABOLIC PANEL
ALK PHOS: 42 U/L (ref 38–126)
ALT: 20 U/L (ref 14–54)
AST: 28 U/L (ref 15–41)
Albumin: 4.2 g/dL (ref 3.5–5.0)
Anion gap: 8 (ref 5–15)
BUN: 15 mg/dL (ref 6–20)
CALCIUM: 9.2 mg/dL (ref 8.9–10.3)
CHLORIDE: 102 mmol/L (ref 101–111)
CO2: 25 mmol/L (ref 22–32)
CREATININE: 0.84 mg/dL (ref 0.44–1.00)
GFR calc Af Amer: >60 mL/min (ref >60)
GFR calc non Af Amer: >60 mL/min (ref >60)
Glucose, Bld: 102 mg/dL — ABNORMAL HIGH (ref 65–99)
Potassium: 3.7 mmol/L (ref 3.5–5.1)
SODIUM: 135 mmol/L (ref 135–145)
TOTAL PROTEIN: 7.3 g/dL (ref 6.5–8.1)
Total Bilirubin: 0.7 mg/dL (ref 0.3–1.2)

## 2016-05-23 LAB — CBC WITH DIFFERENTIAL/PLATELET
BASOS ABS: 0.1 10*3/uL (ref 0–0.1)
BASOS PCT: 3 %
EOS ABS: 0.1 10*3/uL (ref 0–0.7)
Eosinophils Relative: 3 %
HCT: 37.5 % (ref 35.0–47.0)
HEMOGLOBIN: 13.1 g/dL (ref 12.0–16.0)
LYMPHS ABS: 0.4 10*3/uL — AB (ref 1.0–3.6)
Lymphocytes Relative: 19 %
MCH: 37.7 pg — ABNORMAL HIGH (ref 26.0–34.0)
MCHC: 35 g/dL (ref 32.0–36.0)
MCV: 107.7 fL — ABNORMAL HIGH (ref 80.0–100.0)
Monocytes Absolute: 0.4 10*3/uL (ref 0.2–0.9)
Monocytes Relative: 17 %
NEUTROS PCT: 58 %
Neutro Abs: 1.3 10*3/uL — ABNORMAL LOW (ref 1.4–6.5)
PLATELETS: 128 10*3/uL — AB (ref 150–440)
RBC: 3.48 MIL/uL — AB (ref 3.80–5.20)
RDW: 15.7 % — ABNORMAL HIGH (ref 11.5–14.5)
WBC: 2.2 10*3/uL — AB (ref 3.6–11.0)

## 2016-05-23 MED ORDER — FULVESTRANT 250 MG/5ML IM SOLN
500.0000 mg | Freq: Once | INTRAMUSCULAR | Status: AC
  Administered 2016-05-23: 500 mg via INTRAMUSCULAR
  Filled 2016-05-23: qty 10

## 2016-05-23 NOTE — Progress Notes (Signed)
[place] OFFICE PROGRESS NOTE  Patient Care Team: Hayley Simmer, MD as PCP - General (Endocrinology)  Cancer of breast [facility])   Staging form: Breast, AJCC 7th Edition     Clinical: Stage IV (T4, N1, M1) - Signed by Hayley Gleason, MD on 01/20/2015    Oncology History   # June 2015-RIGHT BREAST CA ? Recurrent- IMC [nipple inverted; T4-skin invol; s/p Mastec; No LN; Hayley [name] ]; ER/PR- Pos 51-90%;Her 2 neu-NEG;Declined RT/Anti-hormone;   #  STAGE IV- Nov 2016- IMC ER-90%; PR-51-90%; her 2 neu-NEG [right ax LN Bx] NOV 2016- IBRANCE + Letrozole; April 2017- PET- RLL/LLL- progression; Supraclav/Ax LN- STABLE/TM- rising;June 20th 2017 FASLODEX+ Ibrance 1107m; OCT 2nd- CT C/A/P- STABLE pul mets.   # NOV CT Neck- Progression in brachial plexus area-  RT [s/p RT- middec 2017]  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 [[facility] s/p lumpec;s/p Tam x9 M RT]  # 2015- R UE Lymphedema on Sleeve  # Jan 2018- MMR-STABLE.      Carcinoma of lower-inner quadrant of left breast in female, estrogen receptor positive ([place]   01/05/2016 Initial Diagnosis    Carcinoma of lower-inner quadrant of left breast in female, estrogen receptor positive ([facility]        INTERVAL HISTORY:   [name].  female pleasant patient above history of Metastatic ER/PR positive HER-2/neu negative breast cancer currently on ibrance plus Faslodex ; Oligometastatic progression in the brachial plexus right shoulder status post radiation is here for follow-up/ Results of the PET scan.  She continues to have mild tingling and numbness of her right hand middle 2 fingers.  Denies any diarrhea. Denies any nausea vomiting. No shortness of breath or chest pain. No cough. She is fairly active. Denies any hot flashes. Patient's pain is improved. She is not currently taking any pain medication. No fevers no chills. No infections. Right upper extremity swelling improved since using the lymphedema sleeve. Patient complains of reflux in  the last few weeks.   REVIEW OF SYSTEMS:  A complete 10 point review of system is done which is negative except mentioned above/history of present illness.   PAST MEDICAL HISTORY :  Past Medical History:  Diagnosis Date  . Arthritis   . Breast cancer ([facility] 17+ years ago   Right Breast c radiation -- 2015 Mastectomy  . Cancer of breast ([place] 01/20/2015  . HOH (hard of hearing)    AIDS  . Hypertension   . Malignant neoplasm of lower-inner quadrant of left breast in female, estrogen receptor negative ([place]     PAST SURGICAL HISTORY :   Past Surgical History:  Procedure Laterality Date  . ABDOMINAL HYSTERECTOMY    . BREAST LUMPECTOMY Right 1998   c Radiation  . CATARACT EXTRACTION W/PHACO Left 08/08/2015   Procedure: CATARACT EXTRACTION PHACO AND INTRAOCULAR LENS PLACEMENT (IOC);  Surgeon: Hayley Koyanagi MD;  Location: [facility] ORS;  Service: Ophthalmology;  Laterality: Left;  [place]58.[place] AP% 14.9 CDE 8.60 Fluid Pack lot # [id number]  . CATARACT EXTRACTION W/PHACO Right 12/28/2015   Procedure: CATARACT EXTRACTION PHACO AND INTRAOCULAR LENS PLACEMENT (IOC);  Surgeon: Hayley Koyanagi MD;  [place]  Service: Ophthalmology;  Laterality: Right;  requests early  . JOINT REPLACEMENT Right 2008   knee  . MASTECTOMY Right 2015   After screening in May 2015 - Pt choice    FAMILY HISTORY :  No family history on file.  SOCIAL HISTORY:   Social History  Substance  Use Topics  . Smoking status: Never Smoker  . Smokeless tobacco: Never Used  . Alcohol use No    ALLERGIES:  is allergic to tetracyclines & related.  MEDICATIONS:  Current Outpatient Prescriptions  Medication Sig Dispense Refill  . ALPHA LIPOIC ACID PO Take 1 capsule by mouth daily.     . calcium carbonate (TUMS - DOSED IN MG ELEMENTAL CALCIUM) 500 MG chewable tablet Chew 1 tablet by mouth daily.     . Cholecalciferol (VITAMIN D3 PO) Take 4,000 Units by mouth 2 (two) times daily.    . Coenzyme Q10  (CO Q 10 PO) Take by mouth daily.    . Glucosamine-Chondroitin (GLUCOSAMINE CHONDR COMPLEX PO) Take 1 tablet by mouth 2 (two) times daily.     Marland Kitchen lisinopril (PRINIVIL,ZESTRIL) 5 MG tablet Take 5 mg by mouth daily.     . Melatonin 3 MG TABS Take 3 mg by mouth as needed.    . palbociclib (IBRANCE) 100 MG capsule Take 1 capsule (100 mg total) by mouth daily with breakfast. Take whole with food. Take for 3 weeks, then 1 week off. 21 capsule 3  . RESVERATROL PO Take 1 tablet by mouth daily.      No current facility-administered medications for this visit.     PHYSICAL EXAMINATION: ECOG PERFORMANCE STATUS: 0 - Asymptomatic  BP (!) 156/78 (BP Location: Left Arm, Patient Position: Sitting)   Pulse 94   Temp 97.6 F (36.4 C) (Tympanic)   Resp 18   Ht '5\' 5"'  (1.651 m)   Wt 113 lb 3.2 oz (51.3 kg)   BMI 18.84 kg/m   Filed Weights   05/23/16 0916  Weight: 113 lb 3.2 oz (51.3 kg)    GENERAL: Well-nourished well-developed; Alert, no distress and comfortable.  Alone.  EYES: no pallor or icterus OROPHARYNX: no thrush or ulceration; good dentition  NECK: supple, right supraclavicular adenopathy felt.  LYMPH:  no palpable lymphadenopathy in the cervical, axillary or inguinal regions LUNGS: clear to auscultation and  No wheeze or crackles HEART/CVS: regular rate & rhythm and no murmurs; No lower extremity edema ABDOMEN:abdomen soft, non-tender and normal bowel sounds Musculoskeletal:no cyanosis of digits and no clubbing  PSYCH: alert & oriented x 3 with fluent speech NEURO: no focal motor/sensory deficits SKIN:  no rashes or significant lesions  LABORATORY DATA:  I have reviewed the data as listed    Component Value Date/Time   NA 135 05/23/2016 0850   K 3.7 05/23/2016 0850   CL 102 05/23/2016 0850   CO2 25 05/23/2016 0850   GLUCOSE 102 (H) 05/23/2016 0850   BUN 15 05/23/2016 0850   CREATININE 0.84 05/23/2016 0850   CALCIUM 9.2 05/23/2016 0850   PROT 7.3 05/23/2016 0850   ALBUMIN 4.2  05/23/2016 0850   AST 28 05/23/2016 0850   ALT 20 05/23/2016 0850   ALKPHOS 42 05/23/2016 0850   BILITOT 0.7 05/23/2016 0850   GFRNONAA >60 05/23/2016 0850   GFRAA >60 05/23/2016 0850    No results found for: SPEP, UPEP  Lab Results  Component Value Date   WBC 2.2 (L) 05/23/2016   NEUTROABS 1.3 (L) 05/23/2016   HGB 13.1 05/23/2016   HCT 37.5 05/23/2016   MCV 107.7 (H) 05/23/2016   PLT 128 (L) 05/23/2016      Chemistry      Component Value Date/Time   NA 135 05/23/2016 0850   K 3.7 05/23/2016 0850   CL 102 05/23/2016 0850   CO2 25 05/23/2016 0850  BUN 15 05/23/2016 0850   CREATININE 0.84 05/23/2016 0850      Component Value Date/Time   CALCIUM 9.2 05/23/2016 0850   ALKPHOS 42 05/23/2016 0850   AST 28 05/23/2016 0850   ALT 20 05/23/2016 0850   BILITOT 0.7 05/23/2016 0850     IMPRESSION: 1. Stable appearance of bilateral lower lobe pulmonary metastases. 2. Decrease in size of right axillary nodal metastasis. The right supraclavicular and prevascular index nodes are no longer measurable. 3. Aortic atherosclerosis   Electronically Signed   By: Kerby Moors M.D.   On: 12/05/2015 10:33  RADIOGRAPHIC STUDIES: I have personally reviewed the radiological images as listed and agreed with the findings in the report. Nm Pet Image Restag (ps) Skull Base To Thigh  Result Date: 05/22/2016 CLINICAL DATA:  Subsequent treatment strategy for breast cancer. EXAM: NUCLEAR MEDICINE PET SKULL BASE TO THIGH TECHNIQUE: 12.74 mCi F-18 FDG was injected intravenously. Full-ring PET imaging was performed from the skull base to thigh after the radiotracer. CT data was obtained and used for attenuation correction and anatomic localization. FASTING BLOOD GLUCOSE:  Value: 92 mg/dl COMPARISON:  CT neck dated 01/09/2016. CT chest abdomen pelvis dated 12/05/2015. PET-CT dated 06/27/2015. FINDINGS: NECK 4 mm short axis right posterior cervical node (series 3/ image 21), max SUV 2.5, previously  1.4. Mild abnormal soft tissue along the right lateral neck/posterior supraclavicular region (series 3/ image 37), max SUV 3.0, previously 6.3. Mild hypermetabolism along the right supraclavicular region, max SUV 2.6, previously 4.6. CHEST Status post right mastectomy. Mild abnormal soft tissue along the right axilla/lateral aspect of the brachial plexus (series 3/ image 53), max SUV 2.3, previously 6.2. 4 mm short axis prevascular node (series 3/image 59), max SUV 5.2, previously 9.1. Bilateral pulmonary nodules, including: --1.5 cm nodule in the posterior right lung base (series 3/ image 103), max SUV 4.3, previously 7.3 --1.2 cm nodule in the anterior left lower lobe (series 3/ image 105), max SUV 2.1, previously 2.3 --1.8 cm nodule at the posterior left lung base (series 3/ image 109), max SUV 7.0, previously 6.8 Mild cardiomegaly.  No pericardial effusion. ABDOMEN/PELVIS No abnormal hypermetabolic activity within the liver, pancreas, adrenal glands, or spleen. No hypermetabolic lymph nodes in the abdomen or pelvis. SKELETON No focal hypermetabolic activity to suggest skeletal metastasis. IMPRESSION: Status post right mastectomy. Improving metastasis along the right neck, right supraclavicular region, and right axilla/ brachial plexus. Improving prevascular nodal metastasis. Bilateral pulmonary metastases, stable versus mildly improved. Electronically Signed   By: Julian Hy M.D.   On: 05/22/2016 12:13     ASSESSMENT & PLAN:  Carcinoma of lower-inner quadrant of left breast in female, estrogen receptor positive ([place]) Metastatic ER/PR positive HER-2/neu negative breast cancer currently on Faslodex /ibrance- March 20th 2018- improved Right ax mass;STABLE- pul nodules WBC- 2.2; ANC- 1.3; Hb-13/ platelets- 128  # continue faslodex/ ibrance for now. If tumor markers start- getting worse; recommend changing to afinitor+ aromasin.   # Right UE lymphedema- ? Better;  continue pump/sleeve.    # Reflux-  try prilosec prn.   # follow up in 4 weeks/labs.   # I reviewed the blood work- with the patient in detail; also reviewed the imaging independently [as summarized above]; and with the patient in detail.    No orders of the defined types were placed in this encounter.  # I reviewed the blood work- with the patient in detail; also reviewed the imaging independently [as summarized above]; and with the patient in detail.  Hayley Sickle, MD 05/23/2016 1:23 PM

## 2016-05-23 NOTE — Progress Notes (Signed)
Patient here for breast cancer f/u. Pt reports intermittent indigestion-she uses an occasional tums to relieve symptoms. She does not want to use a PPI.

## 2016-05-23 NOTE — Assessment & Plan Note (Addendum)
Metastatic ER/PR positive HER-2/neu negative breast cancer currently on Faslodex /ibrance- March 20th 2018- improved Right ax mass;STABLE- pul nodules WBC- 2.2; ANC- 1.3; Hb-13/ platelets- 128  # continue faslodex/ ibrance for now. If tumor markers start- getting worse; recommend changing to afinitor+ aromasin.   # Right UE lymphedema- ? Better;  continue pump/sleeve.    # Reflux- try prilosec prn.   # follow up in 4 weeks/labs.   # I reviewed the blood work- with the patient in detail; also reviewed the imaging independently [as summarized above]; and with the patient in detail.

## 2016-05-24 LAB — CANCER ANTIGEN 27.29: CA 27.29: 80 U/mL — ABNORMAL HIGH (ref 0.0–38.6)

## 2016-06-20 ENCOUNTER — Ambulatory Visit: Payer: Medicare HMO | Admitting: Internal Medicine

## 2016-06-20 ENCOUNTER — Other Ambulatory Visit: Payer: Medicare HMO

## 2016-06-20 ENCOUNTER — Ambulatory Visit: Payer: Medicare HMO

## 2016-06-21 ENCOUNTER — Inpatient Hospital Stay (HOSPITAL_BASED_OUTPATIENT_CLINIC_OR_DEPARTMENT_OTHER): Payer: Medicare HMO | Admitting: Internal Medicine

## 2016-06-21 ENCOUNTER — Inpatient Hospital Stay: Payer: Medicare HMO

## 2016-06-21 ENCOUNTER — Inpatient Hospital Stay: Payer: Medicare HMO | Attending: Internal Medicine

## 2016-06-21 VITALS — BP 123/68 | HR 90 | Temp 97.8°F | Resp 16 | Wt 116.0 lb

## 2016-06-21 DIAGNOSIS — Z79818 Long term (current) use of other agents affecting estrogen receptors and estrogen levels: Secondary | ICD-10-CM

## 2016-06-21 DIAGNOSIS — Z9011 Acquired absence of right breast and nipple: Secondary | ICD-10-CM | POA: Insufficient documentation

## 2016-06-21 DIAGNOSIS — C50312 Malignant neoplasm of lower-inner quadrant of left female breast: Secondary | ICD-10-CM

## 2016-06-21 DIAGNOSIS — K219 Gastro-esophageal reflux disease without esophagitis: Secondary | ICD-10-CM

## 2016-06-21 DIAGNOSIS — I700 Atherosclerosis of aorta: Secondary | ICD-10-CM | POA: Diagnosis not present

## 2016-06-21 DIAGNOSIS — C7800 Secondary malignant neoplasm of unspecified lung: Secondary | ICD-10-CM | POA: Insufficient documentation

## 2016-06-21 DIAGNOSIS — C50811 Malignant neoplasm of overlapping sites of right female breast: Secondary | ICD-10-CM

## 2016-06-21 DIAGNOSIS — Z170 Estrogen receptor positive status [ER+]: Secondary | ICD-10-CM | POA: Diagnosis not present

## 2016-06-21 DIAGNOSIS — C7951 Secondary malignant neoplasm of bone: Secondary | ICD-10-CM | POA: Insufficient documentation

## 2016-06-21 DIAGNOSIS — I890 Lymphedema, not elsewhere classified: Secondary | ICD-10-CM | POA: Insufficient documentation

## 2016-06-21 DIAGNOSIS — I10 Essential (primary) hypertension: Secondary | ICD-10-CM | POA: Insufficient documentation

## 2016-06-21 LAB — CBC WITH DIFFERENTIAL/PLATELET
Basophils Absolute: 0.1 10*3/uL (ref 0–0.1)
Basophils Relative: 2 %
Eosinophils Absolute: 0.1 10*3/uL (ref 0–0.7)
Eosinophils Relative: 2 %
HCT: 36 % (ref 35.0–47.0)
Hemoglobin: 12.6 g/dL (ref 12.0–16.0)
Lymphocytes Relative: 17 %
Lymphs Abs: 0.4 10*3/uL — ABNORMAL LOW (ref 1.0–3.6)
MCH: 37.9 pg — ABNORMAL HIGH (ref 26.0–34.0)
MCHC: 35.1 g/dL (ref 32.0–36.0)
MCV: 108 fL — ABNORMAL HIGH (ref 80.0–100.0)
Monocytes Absolute: 0.4 10*3/uL (ref 0.2–0.9)
Monocytes Relative: 18 %
Neutro Abs: 1.5 10*3/uL (ref 1.4–6.5)
Neutrophils Relative %: 61 %
Platelets: 138 10*3/uL — ABNORMAL LOW (ref 150–440)
RBC: 3.33 MIL/uL — ABNORMAL LOW (ref 3.80–5.20)
RDW: 15.3 % — ABNORMAL HIGH (ref 11.5–14.5)
WBC: 2.5 10*3/uL — ABNORMAL LOW (ref 3.6–11.0)

## 2016-06-21 LAB — COMPREHENSIVE METABOLIC PANEL
ALBUMIN: 4.3 g/dL (ref 3.5–5.0)
ALK PHOS: 42 U/L (ref 38–126)
ALT: 17 U/L (ref 14–54)
AST: 24 U/L (ref 15–41)
Anion gap: 4 — ABNORMAL LOW (ref 5–15)
BILIRUBIN TOTAL: 0.8 mg/dL (ref 0.3–1.2)
BUN: 17 mg/dL (ref 6–20)
CALCIUM: 9 mg/dL (ref 8.9–10.3)
CO2: 29 mmol/L (ref 22–32)
CREATININE: 0.78 mg/dL (ref 0.44–1.00)
Chloride: 101 mmol/L (ref 101–111)
GFR calc Af Amer: >60 mL/min (ref >60)
GLUCOSE: 116 mg/dL — AB (ref 65–99)
POTASSIUM: 3.7 mmol/L (ref 3.5–5.1)
Sodium: 134 mmol/L — ABNORMAL LOW (ref 135–145)
TOTAL PROTEIN: 6.7 g/dL (ref 6.5–8.1)

## 2016-06-21 MED ORDER — FULVESTRANT 250 MG/5ML IM SOLN
500.0000 mg | Freq: Once | INTRAMUSCULAR | Status: AC
  Administered 2016-06-21: 500 mg via INTRAMUSCULAR
  Filled 2016-06-21: qty 10

## 2016-06-21 NOTE — Progress Notes (Signed)
[name]  Patient Care Team: Hayley Simmer, [name] as PCP - General (Endocrinology)  Cancer of breast [name])   Staging form: Breast, AJCC 7th Edition     Clinical: Stage IV (T4, N1, M1) - Signed by Hayley Gleason, [name] on 01/20/2015    Oncology History   # June 2015-RIGHT BREAST CA ? Recurrent- IMC [nipple inverted; T4-skin invol; s/p Mastec; No LN; [name] ]; ER/PR- Pos 51-90%;Her 2 neu-NEG;Declined RT/Anti-hormone;   #  STAGE IV- Nov 2016- IMC ER-90%; PR-51-90%; her 2 neu-NEG [right ax LN Bx] NOV 2016- IBRANCE + Letrozole; April 2017- PET- RLL/LLL- progression; Supraclav/Ax LN- STABLE/TM- rising;June 20th 2017 FASLODEX+ Ibrance 177m; OCT 2nd- CT C/A/P- STABLE pul mets.   # NOV CT Neck- Progression in brachial plexus area-  RT [s/p RT- middec 2017]  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 [[name] s/p lumpec;s/p Tam x9 M RT]  # 2015- R UE Lymphedema on Sleeve  # Jan 2018- MMR-STABLE.      Carcinoma of lower-inner quadrant of left breast in female, estrogen receptor positive ([name]   01/05/2016 Initial Diagnosis    Carcinoma of lower-inner quadrant of left breast in female, estrogen receptor positive ([name]        INTERVAL HISTORY:   [name]  female pleasant patient above history of Metastatic ER/PR positive HER-2/neu negative breast cancer currently on ibrance plus Faslodex ; Oligometastatic progression in the brachial plexus right shoulder status post radiation is here for follow-up.  Patient states her reflux symptoms have improved. Patient did not take Prilosec as recommended. Patient states she has been using natural remedies.   Denies any nausea vomiting. No shortness of breath or chest pain. No cough. She is fairly active. Denies any hot flashes.   REVIEW OF SYSTEMS:  A complete 10 point review of system is done which is negative except mentioned above/history of present illness.   PAST MEDICAL HISTORY :  Past Medical History:  Diagnosis  Date  . Arthritis   . Breast cancer ([facility] 17[place] ago   Right Breast c radiation -- 2015 Mastectomy  . [place]  . HOH (hard of hearing)    AIDS  . Hypertension   . Malignant neoplasm of lower-inner quadrant of left breast in female, estrogen receptor negative [place]     PAST SURGICAL HISTORY :   Past Surgical History:  Procedure Laterality Date  . ABDOMINAL HYSTERECTOMY    . BREAST LUMPECTOMY Right 1998   c Radiation  . CATARACT EXTRACTION W/PHACO Left 08/08/2015   Procedure: CATARACT EXTRACTION PHACO AND INTRAOCULAR LENS PLACEMENT (IOC);  Surgeon: [name];  Location: [name];  Service: Ophthalmology;  Laterality: Left;  [place]58.[place] AP% 14.9 CDE 8.60 Fluid Pack lot # [id number]  . CATARACT EXTRACTION W/PHACO Right 12/28/2015   Procedure: CATARACT EXTRACTION PHACO AND INTRAOCULAR LENS PLACEMENT (IOC);  Surgeon: [name];  Location: [name]  Service: Ophthalmology;  Laterality: Right;  requests early  . JOINT REPLACEMENT Right 2008   knee  . MASTECTOMY Right 2015   After screening in May 2015 - Pt choice    FAMILY HISTORY :  No family history on file.  SOCIAL HISTORY:   Social History  Substance Use Topics  . Smoking status: Never Smoker  . Smokeless tobacco: Never Used  . Alcohol use No    ALLERGIES:  is allergic to tetracyclines & related.  MEDICATIONS:  Current Outpatient Prescriptions  Medication Sig Dispense Refill  . ALPHA LIPOIC ACID PO Take 1 capsule by mouth daily.     . calcium carbonate (TUMS - DOSED IN MG ELEMENTAL CALCIUM) 500 MG chewable tablet Chew 1 tablet by mouth daily.     . Cholecalciferol (VITAMIN D3 PO) Take 4,000 Units by mouth 2 (two) times daily.    . Coenzyme Q10 (CO Q 10 PO) Take by mouth daily.    . Glucosamine-Chondroitin (GLUCOSAMINE CHONDR COMPLEX PO) Take 1 tablet by mouth 2 (two) times daily.     Marland Kitchen lisinopril (PRINIVIL,ZESTRIL) 5 MG tablet Take 5 mg by mouth daily.     .  Melatonin 3 MG TABS Take 3 mg by mouth as needed.    . palbociclib (IBRANCE) 100 MG capsule Take 1 capsule (100 mg total) by mouth daily with breakfast. Take whole with food. Take for 3 weeks, then 1 week off. 21 capsule 3  . RESVERATROL PO Take 1 tablet by mouth daily.      No current facility-administered medications for this visit.     PHYSICAL EXAMINATION: ECOG PERFORMANCE STATUS: 0 - Asymptomatic  BP 123/68 (BP Location: Left Arm, Patient Position: Sitting)   Pulse 90   Temp 97.8 F (36.6 C) (Tympanic)   Resp 16   Wt 116 lb (52.6 kg)   BMI 19.30 kg/m   Filed Weights   06/21/16 0916  Weight: 116 lb (52.6 kg)    GENERAL: Well-nourished well-developed; Alert, no distress and comfortable.  Alone.  EYES: no pallor or icterus OROPHARYNX: no thrush or ulceration; good dentition  NECK: supple, right supraclavicular adenopathy felt.  LYMPH:  no palpable lymphadenopathy in the cervical, axillary or inguinal regions LUNGS: clear to auscultation and  No wheeze or crackles HEART/CVS: regular rate & rhythm and no murmurs; No lower extremity edema ABDOMEN:abdomen soft, non-tender and normal bowel sounds Musculoskeletal:no cyanosis of digits and no clubbing  PSYCH: alert & oriented x 3 with fluent speech NEURO: no focal motor/sensory deficits SKIN:  no rashes or significant lesions  LABORATORY DATA:  I have reviewed the data as listed    Component Value Date/Time   NA 134 (L) 06/21/2016 0845   K 3.7 06/21/2016 0845   CL 101 06/21/2016 0845   CO2 29 06/21/2016 0845   GLUCOSE 116 (H) 06/21/2016 0845   BUN 17 06/21/2016 0845   CREATININE 0.78 06/21/2016 0845   CALCIUM 9.0 06/21/2016 0845   PROT 6.7 06/21/2016 0845   ALBUMIN 4.3 06/21/2016 0845   AST 24 06/21/2016 0845   ALT 17 06/21/2016 0845   ALKPHOS 42 06/21/2016 0845   BILITOT 0.8 06/21/2016 0845   GFRNONAA >60 06/21/2016 0845   GFRAA >60 06/21/2016 0845    No results found for: SPEP, UPEP  Lab Results  Component  Value Date   WBC 2.5 (L) 06/21/2016   NEUTROABS 1.5 06/21/2016   HGB 12.6 06/21/2016   HCT 36.0 06/21/2016   MCV 108.0 (H) 06/21/2016   PLT 138 (L) 06/21/2016      Chemistry      Component Value Date/Time   NA 134 (L) 06/21/2016 0845   K 3.7 06/21/2016 0845   CL 101 06/21/2016 0845   CO2 29 06/21/2016 0845   BUN 17 06/21/2016 0845   CREATININE 0.78 06/21/2016 0845      Component Value Date/Time   CALCIUM 9.0 06/21/2016 0845   ALKPHOS 42 06/21/2016 0845   AST 24 06/21/2016 0845   ALT 17 06/21/2016 0845   BILITOT 0.8  06/21/2016 0845     IMPRESSION: 1. Stable appearance of bilateral lower lobe pulmonary metastases. 2. Decrease in size of right axillary nodal metastasis. The right supraclavicular and prevascular index nodes are no longer measurable. 3. Aortic atherosclerosis   Electronically Signed   By: Kerby Moors M.D.   On: 12/05/2015 10:33  RADIOGRAPHIC STUDIES: I have personally reviewed the radiological images as listed and agreed with the findings in the report. No results found.   ASSESSMENT & PLAN:  Carcinoma of lower-inner quadrant of left breast in female, estrogen receptor positive ([place]) Metastatic ER/PR positive HER-2/neu negative breast cancer currently on Faslodex /ibrance- March 20th 2018- improved Right ax mass;STABLE- pul nodules WBC- 2.5; ANC- 1.5; Hb-12/ platelets- 138; but tumor marker trending up to 80. Awaiting from today.   # continue faslodex/ ibrance for now. If tumor markers start- getting worse; recommend changing to afinitor+ aromasin. Again discussed at length- patient continues to be resistant to chemotherapy.  # Reflux- improved. Patient using "natural remedies"  # follow up in 4 weeks/labs.    Orders Placed This Encounter  Procedures  . CBC with Differential/Platelet    Standing Status:   Future    Standing Expiration Date:   06/21/2017  . Comprehensive metabolic panel    Standing Status:   Future    Standing Expiration  Date:   06/21/2017  . Cancer antigen 27.29    Standing Status:   Future    Standing Expiration Date:   06/21/2017   # I reviewed the blood work- with the patient in detail; also reviewed the imaging independently [as summarized above]; and with the patient in detail.       Cammie Sickle, [name] 06/24/2016 7:55 PM

## 2016-06-21 NOTE — Assessment & Plan Note (Addendum)
Metastatic ER/PR positive HER-2/neu negative breast cancer currently on Faslodex /ibrance- March 20th 2018- improved Right ax mass;STABLE- pul nodules WBC- 2.5; ANC- 1.5; Hb-12/ platelets- 138; but tumor marker trending up to 80. Awaiting from today.   # continue faslodex/ ibrance for now. If tumor markers start- getting worse; recommend changing to afinitor+ aromasin. Again discussed at length- patient continues to be resistant to chemotherapy.  # Reflux- improved. Patient using "natural remedies"  # follow up in 4 weeks/labs.

## 2016-06-21 NOTE — Progress Notes (Signed)
Patient here today for follow up.  Patient states no new concerns today  

## 2016-06-23 LAB — CANCER ANTIGEN 27.29: CA 27.29: 72.9 U/mL — AB (ref 0.0–38.6)

## 2016-07-19 ENCOUNTER — Inpatient Hospital Stay: Payer: Medicare HMO | Attending: Internal Medicine

## 2016-07-19 ENCOUNTER — Inpatient Hospital Stay: Payer: Medicare HMO

## 2016-07-19 ENCOUNTER — Inpatient Hospital Stay (HOSPITAL_BASED_OUTPATIENT_CLINIC_OR_DEPARTMENT_OTHER): Payer: Medicare HMO | Admitting: Internal Medicine

## 2016-07-19 VITALS — BP 158/72 | HR 80 | Temp 97.8°F | Resp 18 | Wt 115.0 lb

## 2016-07-19 DIAGNOSIS — Z79818 Long term (current) use of other agents affecting estrogen receptors and estrogen levels: Secondary | ICD-10-CM | POA: Diagnosis not present

## 2016-07-19 DIAGNOSIS — C7800 Secondary malignant neoplasm of unspecified lung: Secondary | ICD-10-CM

## 2016-07-19 DIAGNOSIS — C50312 Malignant neoplasm of lower-inner quadrant of left female breast: Secondary | ICD-10-CM | POA: Diagnosis present

## 2016-07-19 DIAGNOSIS — M1990 Unspecified osteoarthritis, unspecified site: Secondary | ICD-10-CM | POA: Insufficient documentation

## 2016-07-19 DIAGNOSIS — Z9221 Personal history of antineoplastic chemotherapy: Secondary | ICD-10-CM | POA: Diagnosis not present

## 2016-07-19 DIAGNOSIS — Z170 Estrogen receptor positive status [ER+]: Secondary | ICD-10-CM | POA: Diagnosis not present

## 2016-07-19 DIAGNOSIS — I10 Essential (primary) hypertension: Secondary | ICD-10-CM | POA: Insufficient documentation

## 2016-07-19 DIAGNOSIS — I700 Atherosclerosis of aorta: Secondary | ICD-10-CM

## 2016-07-19 DIAGNOSIS — C50811 Malignant neoplasm of overlapping sites of right female breast: Secondary | ICD-10-CM

## 2016-07-19 DIAGNOSIS — Z923 Personal history of irradiation: Secondary | ICD-10-CM | POA: Insufficient documentation

## 2016-07-19 DIAGNOSIS — C778 Secondary and unspecified malignant neoplasm of lymph nodes of multiple regions: Secondary | ICD-10-CM | POA: Diagnosis not present

## 2016-07-19 LAB — COMPREHENSIVE METABOLIC PANEL
ALBUMIN: 4.1 g/dL (ref 3.5–5.0)
ALK PHOS: 42 U/L (ref 38–126)
ALT: 21 U/L (ref 14–54)
ANION GAP: 7 (ref 5–15)
AST: 26 U/L (ref 15–41)
BILIRUBIN TOTAL: 0.6 mg/dL (ref 0.3–1.2)
BUN: 19 mg/dL (ref 6–20)
CALCIUM: 9.3 mg/dL (ref 8.9–10.3)
CO2: 26 mmol/L (ref 22–32)
Chloride: 103 mmol/L (ref 101–111)
Creatinine, Ser: 0.83 mg/dL (ref 0.44–1.00)
GFR calc Af Amer: >60 mL/min (ref >60)
GFR calc non Af Amer: >60 mL/min (ref >60)
GLUCOSE: 87 mg/dL (ref 65–99)
Potassium: 4.2 mmol/L (ref 3.5–5.1)
SODIUM: 136 mmol/L (ref 135–145)
TOTAL PROTEIN: 6.8 g/dL (ref 6.5–8.1)

## 2016-07-19 LAB — CBC WITH DIFFERENTIAL/PLATELET
BASOS ABS: 0.1 10*3/uL (ref 0–0.1)
Basophils Relative: 2 %
EOS PCT: 3 %
Eosinophils Absolute: 0.1 10*3/uL (ref 0–0.7)
HEMATOCRIT: 35.7 % (ref 35.0–47.0)
HEMOGLOBIN: 12.5 g/dL (ref 12.0–16.0)
LYMPHS ABS: 0.5 10*3/uL — AB (ref 1.0–3.6)
LYMPHS PCT: 17 %
MCH: 37.7 pg — ABNORMAL HIGH (ref 26.0–34.0)
MCHC: 35.1 g/dL (ref 32.0–36.0)
MCV: 107.5 fL — ABNORMAL HIGH (ref 80.0–100.0)
Monocytes Absolute: 0.6 10*3/uL (ref 0.2–0.9)
Monocytes Relative: 22 %
NEUTROS PCT: 56 %
Neutro Abs: 1.7 10*3/uL (ref 1.4–6.5)
Platelets: 153 10*3/uL (ref 150–440)
RBC: 3.32 MIL/uL — AB (ref 3.80–5.20)
RDW: 15.3 % — ABNORMAL HIGH (ref 11.5–14.5)
WBC: 3 10*3/uL — AB (ref 3.6–11.0)

## 2016-07-19 MED ORDER — FULVESTRANT 250 MG/5ML IM SOLN
500.0000 mg | Freq: Once | INTRAMUSCULAR | Status: AC
  Administered 2016-07-19: 500 mg via INTRAMUSCULAR
  Filled 2016-07-19: qty 10

## 2016-07-19 NOTE — Assessment & Plan Note (Addendum)
Metastatic ER/PR positive HER-2/neu negative breast cancer currently on Faslodex /ibrance- March 20th 2018- improved Right ax mass;STABLE- pul nodules WBC- 2.5; ANC- 1.5; Hb-12/ platelets- 138; but tumor marker trending up/stable 72. Awaiting from today.  # continue faslodex/ ibrance for now. If tumor markers start- getting worse; recommend changing to afinitor+ aromasin [pt reluctant].  Again discussed at length- patient continues decline chemotherapy as an option at this time.   # Arthtitis-  Anti-hormone therapy/osteoarthritis. Continue chondroitin sulfate vitamin D.   #  4 weeks/labs/shots;  8 weeks/labs;shots; wants to wait for scans until she sees me in 2 months.  

## 2016-07-19 NOTE — Progress Notes (Signed)
[place] OFFICE PROGRESS NOTE  Patient Care Team: [name], Hayley Sledge, [name] as PCP - General (Endocrinology)  Cancer of breast [facility])   Staging form: Breast, AJCC 7th Edition     Clinical: Stage IV (T4, N1, M1) - Signed by Hayley Gleason, [name] on 01/20/2015    Oncology History   # June 2015-RIGHT BREAST CA ? Recurrent- IMC [nipple inverted; T4-skin invol; s/p Mastec; No LN; Dr.Smith ]; ER/PR- Pos 51-90%;Her 2 neu-NEG;Declined RT/Anti-hormone;   #  STAGE IV- Nov 2016- IMC ER-90%; PR-51-90%; her 2 neu-NEG [right ax LN Bx] NOV 2016- IBRANCE + Letrozole; April 2017- PET- RLL/LLL- progression; Supraclav/Ax LN- STABLE/TM- rising;June 20th 2017 FASLODEX+ Ibrance 133m; OCT 2nd- CT C/A/P- STABLE pul mets.   # NOV CT Neck- Progression in brachial plexus area-  RT [s/p RT- middec 2017]  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 [[name] s/p lumpec;s/p Tam x9 M RT]  # 2015- R UE Lymphedema on Sleeve  # Jan 2018- MMR-STABLE.      Carcinoma of lower-inner quadrant of left breast in [place], estrogen receptor positive [place]   01/05/2016 Initial Diagnosis    Carcinoma of lower-inner quadrant of left breast in female, estrogen receptor positive ([name]        INTERVAL HISTORY:   [name]  female pleasant patient above history of Metastatic ER/PR positive HER-2/neu negative breast cancer currently on ibrance plus Faslodex ; Oligometastatic progression in the brachial plexus right shoulder status post radiation is here for follow-up.  Patient complains of mild-to-moderate arthritis in her hands. She thinks current therapy making her chronic osteoarthritis worse. She wants to continue current therapy at this time.   Denies any nausea vomiting. No shortness of breath or chest pain. No cough. She is fairly active. Denies any hot flashes.   REVIEW OF SYSTEMS:  A complete 10 point review of system is done which is negative except mentioned above/history of present illness.   PAST MEDICAL HISTORY :   Past Medical History:  Diagnosis Date  . Arthritis   . Breast [place] 17[place]   Right Breast c radiation -- 2015 Mastectomy  . Cancer of breast ([place]  . HOH (hard of hearing)    AIDS  . Hypertension   . Malignant neoplasm of lower-inner quadrant of left breast in female, estrogen receptor negative ([place]     PAST SURGICAL HISTORY :   Past Surgical History:  Procedure Laterality Date  . ABDOMINAL HYSTERECTOMY    . BREAST LUMPECTOMY Right 1998   c Radiation  . CATARACT EXTRACTION W/PHACO Left 08/08/2015   Procedure: CATARACT EXTRACTION PHACO AND INTRAOCULAR LENS PLACEMENT (IOC);  Surgeon: CLeandrew Koyanagi [name];  Location: [facility];  Service: Ophthalmology;  Laterality: Left;  [place]58.0 AP% 14.9 CDE 8.60 Fluid Pack lot # [id number]  . CATARACT EXTRACTION W/PHACO Right 12/28/2015   Procedure: CATARACT EXTRACTION PHACO AND INTRAOCULAR LENS PLACEMENT (IOC);  Surgeon: CLeandrew Koyanagi [name];  [place]  Service: Ophthalmology;  Laterality: Right;  requests early  . JOINT REPLACEMENT Right 2008   knee  . MASTECTOMY Right 2015   After screening in May 2015 - Pt choice    FAMILY HISTORY :  No family history on file.  SOCIAL HISTORY:   Social History  Substance Use Topics  . Smoking status: Never Smoker  . Smokeless tobacco: Never Used  . Alcohol use No    ALLERGIES:  is allergic to tetracyclines & related.  MEDICATIONS:  Current Outpatient Prescriptions  Medication Sig Dispense Refill  . ALPHA LIPOIC ACID PO Take 1 capsule by mouth daily.     . calcium carbonate (TUMS - DOSED IN MG ELEMENTAL CALCIUM) 500 MG chewable tablet Chew 1 tablet by mouth daily.     . Cholecalciferol (VITAMIN D3 PO) Take 4,000 Units by mouth 2 (two) times daily.    . Coenzyme Q10 (CO Q 10 PO) Take by mouth daily.    . Glucosamine-Chondroitin (GLUCOSAMINE CHONDR COMPLEX PO) Take 1 tablet by mouth 2 (two) times daily.     Marland Kitchen lisinopril (PRINIVIL,ZESTRIL) 5 MG  tablet Take 5 mg by mouth daily.     . Melatonin 3 MG TABS Take 3 mg by mouth as needed.    . palbociclib (IBRANCE) 100 MG capsule Take 1 capsule (100 mg total) by mouth daily with breakfast. Take whole with food. Take for 3 weeks, then 1 week off. 21 capsule 3  . RESVERATROL PO Take 1 tablet by mouth daily.      No current facility-administered medications for this visit.     PHYSICAL EXAMINATION: ECOG PERFORMANCE STATUS: 0 - Asymptomatic  BP (!) 158/72 (BP Location: Left Arm, Patient Position: Sitting)   Pulse 80   Temp 97.8 F (36.6 C) (Tympanic)   Resp 18   Wt 115 lb (52.2 kg)   BMI 19.14 kg/m   Filed Weights   07/19/16 1126  Weight: 115 lb (52.2 kg)    GENERAL: Well-nourished well-developed; Alert, no distress and comfortable.  Alone.  EYES: no pallor or icterus OROPHARYNX: no thrush or ulceration; good dentition  NECK: supple, right supraclavicular adenopathy felt.  LYMPH:  no palpable lymphadenopathy in the cervical, axillary or inguinal regions LUNGS: clear to auscultation and  No wheeze or crackles HEART/CVS: regular rate & rhythm and no murmurs; No lower extremity edema ABDOMEN:abdomen soft, non-tender and normal bowel sounds Musculoskeletal:no cyanosis of digits and no clubbing  PSYCH: alert & oriented x 3 with fluent speech NEURO: no focal motor/sensory deficits SKIN:  no rashes or significant lesions  LABORATORY DATA:  I have reviewed the data as listed    Component Value Date/Time   NA 136 07/19/2016 1100   K 4.2 07/19/2016 1100   CL 103 07/19/2016 1100   CO2 26 07/19/2016 1100   GLUCOSE 87 07/19/2016 1100   BUN 19 07/19/2016 1100   CREATININE 0.83 07/19/2016 1100   CALCIUM 9.3 07/19/2016 1100   PROT 6.8 07/19/2016 1100   ALBUMIN 4.1 07/19/2016 1100   AST 26 07/19/2016 1100   ALT 21 07/19/2016 1100   ALKPHOS 42 07/19/2016 1100   BILITOT 0.6 07/19/2016 1100   GFRNONAA >60 07/19/2016 1100   GFRAA >60 07/19/2016 1100    No results found for:  SPEP, UPEP  Lab Results  Component Value Date   WBC 3.0 (L) 07/19/2016   NEUTROABS 1.7 07/19/2016   HGB 12.5 07/19/2016   HCT 35.7 07/19/2016   MCV 107.5 (H) 07/19/2016   PLT 153 07/19/2016      Chemistry      Component Value Date/Time   NA 136 07/19/2016 1100   K 4.2 07/19/2016 1100   CL 103 07/19/2016 1100   CO2 26 07/19/2016 1100   BUN 19 07/19/2016 1100   CREATININE 0.83 07/19/2016 1100      Component Value Date/Time   CALCIUM 9.3 07/19/2016 1100   ALKPHOS 42 07/19/2016 1100   AST 26 07/19/2016 1100   ALT 21 07/19/2016 1100   BILITOT  0.6 07/19/2016 1100     IMPRESSION: 1. Stable appearance of bilateral lower lobe pulmonary metastases. 2. Decrease in size of right axillary nodal metastasis. The right supraclavicular and prevascular index nodes are no longer measurable. 3. Aortic atherosclerosis   Electronically Signed   By: Kerby Moors M.D.   On: 12/05/2015 10:33  RADIOGRAPHIC STUDIES: I have personally reviewed the radiological images as listed and agreed with the findings in the report. No results found.   ASSESSMENT & PLAN:  Carcinoma of lower-inner quadrant of left breast in female, estrogen receptor positive ([place]) Metastatic ER/PR positive HER-2/neu negative breast cancer currently on Faslodex /ibrance- March 20th 2018- improved Right ax mass;STABLE- pul nodules WBC- 2.5; ANC- 1.5; Hb-12/ platelets- 138; but tumor marker trending up/stable 72. Awaiting from today.  # continue faslodex/ ibrance for now. If tumor markers start- getting worse; recommend changing to afinitor+ aromasin [pt reluctant].  Again discussed at length- patient continues decline chemotherapy as an option at this time.   # Arthtitis-  Anti-hormone therapy/osteoarthritis. Continue chondroitin sulfate vitamin D.   #  4 weeks/labs/shots;  8 weeks/labs;shots; wants to wait for scans until she sees me in 2 months.    No orders of the defined types were placed in this encounter.      Cammie Sickle, [name] 07/22/2016 6:36 PM

## 2016-07-19 NOTE — Progress Notes (Signed)
Patient here today for follow up.  Patient state no new concerns today

## 2016-07-20 LAB — CANCER ANTIGEN 27.29: CA 27.29: 74.6 U/mL — AB (ref 0.0–38.6)

## 2016-08-16 ENCOUNTER — Inpatient Hospital Stay: Payer: Medicare HMO

## 2016-08-16 ENCOUNTER — Other Ambulatory Visit: Payer: Self-pay

## 2016-08-16 ENCOUNTER — Inpatient Hospital Stay: Payer: Medicare HMO | Attending: Internal Medicine

## 2016-08-16 DIAGNOSIS — Z79818 Long term (current) use of other agents affecting estrogen receptors and estrogen levels: Secondary | ICD-10-CM | POA: Diagnosis not present

## 2016-08-16 DIAGNOSIS — C50811 Malignant neoplasm of overlapping sites of right female breast: Secondary | ICD-10-CM

## 2016-08-16 DIAGNOSIS — Z170 Estrogen receptor positive status [ER+]: Secondary | ICD-10-CM | POA: Diagnosis not present

## 2016-08-16 DIAGNOSIS — C50312 Malignant neoplasm of lower-inner quadrant of left female breast: Secondary | ICD-10-CM

## 2016-08-16 LAB — CBC WITH DIFFERENTIAL/PLATELET
BASOS ABS: 0.1 10*3/uL (ref 0–0.1)
BASOS PCT: 3 %
EOS ABS: 0.1 10*3/uL (ref 0–0.7)
Eosinophils Relative: 4 %
HEMATOCRIT: 36.3 % (ref 35.0–47.0)
HEMOGLOBIN: 12.8 g/dL (ref 12.0–16.0)
Lymphocytes Relative: 16 %
Lymphs Abs: 0.5 10*3/uL — ABNORMAL LOW (ref 1.0–3.6)
MCH: 37.8 pg — ABNORMAL HIGH (ref 26.0–34.0)
MCHC: 35.4 g/dL (ref 32.0–36.0)
MCV: 106.8 fL — ABNORMAL HIGH (ref 80.0–100.0)
MONOS PCT: 21 %
Monocytes Absolute: 0.6 10*3/uL (ref 0.2–0.9)
NEUTROS ABS: 1.7 10*3/uL (ref 1.4–6.5)
NEUTROS PCT: 56 %
Platelets: 145 10*3/uL — ABNORMAL LOW (ref 150–440)
RBC: 3.4 MIL/uL — AB (ref 3.80–5.20)
RDW: 14.8 % — ABNORMAL HIGH (ref 11.5–14.5)
WBC: 3 10*3/uL — AB (ref 3.6–11.0)

## 2016-08-16 LAB — COMPREHENSIVE METABOLIC PANEL
ALBUMIN: 4.3 g/dL (ref 3.5–5.0)
ALK PHOS: 49 U/L (ref 38–126)
ALT: 25 U/L (ref 14–54)
AST: 28 U/L (ref 15–41)
Anion gap: 9 (ref 5–15)
BILIRUBIN TOTAL: 0.6 mg/dL (ref 0.3–1.2)
BUN: 16 mg/dL (ref 6–20)
CALCIUM: 9.3 mg/dL (ref 8.9–10.3)
CO2: 26 mmol/L (ref 22–32)
Chloride: 99 mmol/L — ABNORMAL LOW (ref 101–111)
Creatinine, Ser: 0.77 mg/dL (ref 0.44–1.00)
GFR calc Af Amer: >60 mL/min (ref >60)
GFR calc non Af Amer: >60 mL/min (ref >60)
GLUCOSE: 117 mg/dL — AB (ref 65–99)
Potassium: 4.5 mmol/L (ref 3.5–5.1)
SODIUM: 134 mmol/L — AB (ref 135–145)
TOTAL PROTEIN: 7.1 g/dL (ref 6.5–8.1)

## 2016-08-16 MED ORDER — FULVESTRANT 250 MG/5ML IM SOLN
500.0000 mg | Freq: Once | INTRAMUSCULAR | Status: AC
  Administered 2016-08-16: 500 mg via INTRAMUSCULAR

## 2016-08-17 LAB — CANCER ANTIGEN 27.29: CA 27.29: 83.6 U/mL — AB (ref 0.0–38.6)

## 2016-09-12 ENCOUNTER — Inpatient Hospital Stay: Payer: Medicare HMO

## 2016-09-12 ENCOUNTER — Inpatient Hospital Stay: Payer: Medicare HMO | Attending: Internal Medicine

## 2016-09-12 ENCOUNTER — Inpatient Hospital Stay (HOSPITAL_BASED_OUTPATIENT_CLINIC_OR_DEPARTMENT_OTHER): Payer: Medicare HMO | Admitting: Internal Medicine

## 2016-09-12 VITALS — BP 128/68 | HR 80 | Temp 97.9°F | Resp 16 | Wt 114.0 lb

## 2016-09-12 DIAGNOSIS — Z170 Estrogen receptor positive status [ER+]: Secondary | ICD-10-CM | POA: Diagnosis not present

## 2016-09-12 DIAGNOSIS — Z923 Personal history of irradiation: Secondary | ICD-10-CM | POA: Insufficient documentation

## 2016-09-12 DIAGNOSIS — I890 Lymphedema, not elsewhere classified: Secondary | ICD-10-CM | POA: Diagnosis not present

## 2016-09-12 DIAGNOSIS — C50312 Malignant neoplasm of lower-inner quadrant of left female breast: Secondary | ICD-10-CM | POA: Diagnosis not present

## 2016-09-12 DIAGNOSIS — M129 Arthropathy, unspecified: Secondary | ICD-10-CM

## 2016-09-12 DIAGNOSIS — Z9221 Personal history of antineoplastic chemotherapy: Secondary | ICD-10-CM

## 2016-09-12 DIAGNOSIS — C7951 Secondary malignant neoplasm of bone: Secondary | ICD-10-CM | POA: Diagnosis not present

## 2016-09-12 DIAGNOSIS — C7802 Secondary malignant neoplasm of left lung: Secondary | ICD-10-CM | POA: Diagnosis not present

## 2016-09-12 DIAGNOSIS — Z79899 Other long term (current) drug therapy: Secondary | ICD-10-CM | POA: Insufficient documentation

## 2016-09-12 DIAGNOSIS — I10 Essential (primary) hypertension: Secondary | ICD-10-CM

## 2016-09-12 DIAGNOSIS — C7801 Secondary malignant neoplasm of right lung: Secondary | ICD-10-CM

## 2016-09-12 DIAGNOSIS — K219 Gastro-esophageal reflux disease without esophagitis: Secondary | ICD-10-CM

## 2016-09-12 DIAGNOSIS — I700 Atherosclerosis of aorta: Secondary | ICD-10-CM | POA: Diagnosis not present

## 2016-09-12 DIAGNOSIS — M1990 Unspecified osteoarthritis, unspecified site: Secondary | ICD-10-CM | POA: Insufficient documentation

## 2016-09-12 LAB — COMPREHENSIVE METABOLIC PANEL
ALK PHOS: 47 U/L (ref 38–126)
ALT: 20 U/L (ref 14–54)
AST: 26 U/L (ref 15–41)
Albumin: 4 g/dL (ref 3.5–5.0)
Anion gap: 10 (ref 5–15)
BILIRUBIN TOTAL: 0.6 mg/dL (ref 0.3–1.2)
BUN: 16 mg/dL (ref 6–20)
CALCIUM: 9.5 mg/dL (ref 8.9–10.3)
CO2: 26 mmol/L (ref 22–32)
CREATININE: 0.79 mg/dL (ref 0.44–1.00)
Chloride: 97 mmol/L — ABNORMAL LOW (ref 101–111)
Glucose, Bld: 98 mg/dL (ref 65–99)
Potassium: 3.9 mmol/L (ref 3.5–5.1)
Sodium: 133 mmol/L — ABNORMAL LOW (ref 135–145)
TOTAL PROTEIN: 6.6 g/dL (ref 6.5–8.1)

## 2016-09-12 LAB — CBC WITH DIFFERENTIAL/PLATELET
BASOS ABS: 0.1 10*3/uL (ref 0–0.1)
Basophils Relative: 3 %
EOS PCT: 3 %
Eosinophils Absolute: 0.1 10*3/uL (ref 0–0.7)
HCT: 35.9 % (ref 35.0–47.0)
HEMOGLOBIN: 12.7 g/dL (ref 12.0–16.0)
LYMPHS ABS: 0.3 10*3/uL — AB (ref 1.0–3.6)
LYMPHS PCT: 12 %
MCH: 37.8 pg — AB (ref 26.0–34.0)
MCHC: 35.3 g/dL (ref 32.0–36.0)
MCV: 106.9 fL — AB (ref 80.0–100.0)
Monocytes Absolute: 0.5 10*3/uL (ref 0.2–0.9)
Monocytes Relative: 19 %
NEUTROS ABS: 1.7 10*3/uL (ref 1.4–6.5)
Neutrophils Relative %: 63 %
PLATELETS: 134 10*3/uL — AB (ref 150–440)
RBC: 3.35 MIL/uL — AB (ref 3.80–5.20)
RDW: 15.1 % — ABNORMAL HIGH (ref 11.5–14.5)
WBC: 2.7 10*3/uL — AB (ref 3.6–11.0)

## 2016-09-12 NOTE — Assessment & Plan Note (Addendum)
Metastatic ER/PR positive HER-2/neu negative breast cancer currently on Faslodex /ibrance- March 20th 2018- improved Right ax mass;STABLE- pul nodules WBC- 2.7; ANC- 1.7; Hb-12/ platelets- 138; but tumor marker trending up/stable 83. Awaiting from today. Today was her last dose of her ibrance today.  # Given the significant intolerance [arthritis; reflux]- Hold Faslodex. Discussed the mechanism of action at length. We will get a CT scan of the chest abdomen pelvis with contrast- to further decide in the treatment options.  # Reflux- ? ibrance [pt covinced]; recommend holding ibrance today. Recommend PPI.  # s/p tooth extraction-currently on antibiotics.  # Arthtitis-  Anti-hormone therapy/osteoarthritis. Hold Faslodex.  # HOLD shots today; follow up in 4 weeks/labs- ca-27-29/ shots; CT scan prior.

## 2016-09-12 NOTE — Progress Notes (Signed)
Patient here today for follow up.  Patient states no new concerns today  

## 2016-09-12 NOTE — Progress Notes (Signed)
[name] OFFICE PROGRESS NOTE  Patient Care Team: Morayati, [name], MD as PCP - General (Endocrinology)  Cancer of breast [facility])   Staging form: Breast, AJCC 7th Edition     Clinical: Stage IV (T4, N1, M1) - Signed by Forest Gleason, MD on 01/20/2015    Oncology History   # June 2015-RIGHT BREAST CA ? Recurrent- IMC [nipple inverted; T4-skin invol; s/p Mastec; No LN; Dr.Smith ]; ER/PR- Pos 51-90%;Her 2 neu-NEG;Declined RT/Anti-hormone;   #  STAGE IV- Nov 2016- IMC ER-90%; PR-51-90%; her 2 neu-NEG [right ax LN Bx] NOV 2016- IBRANCE + Letrozole; April 2017- PET- RLL/LLL- progression; Supraclav/Ax LN- STABLE/TM- rising;June 20th 2017 FASLODEX+ Ibrance 118m; OCT 2nd- CT C/A/P- STABLE pul mets.   # NOV CT Neck- Progression in brachial plexus area-  RT [s/p RT- middec 2017]  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 [[facility] s/p lumpec;s/p Tam x9 M RT]  # 2015- R UE Lymphedema on Sleeve  # Jan 2018- MMR-STABLE.      Carcinoma of lower-inner quadrant of left breast in female, estrogen receptor positive ([place]   01/05/2016 Initial Diagnosis    Carcinoma of lower-inner quadrant of left breast in female, estrogen receptor positive ([place]        INTERVAL HISTORY:   Hayley Don89y.o.  female pleasant patient above history of Metastatic ER/PR positive HER-2/neu negative breast cancer currently on ibrance plus Faslodex ; Oligometastatic progression in the brachial plexus right shoulder status post radiation is here for follow-up.  Patient complains of worsening arthritis in her hands and feet. She thinks Faslodex is making her arthritis significantly worse.  She also complains of significant reflux- which she attributes to ibrance. However she has not been taking any PPI as recommended prior. Denies any nausea vomiting. No shortness of breath or chest pain. No cough. She is fairly active. Denies any hot flashes. She is interested in  trial off treatment  REVIEW OF SYSTEMS:  A complete 10  point review of system is done which is negative except mentioned above/history of present illness.   PAST MEDICAL HISTORY :  Past Medical History:  Diagnosis Date  . Arthritis   . Breast cancer ([place] 17+ years ago   Right Breast c radiation -- 2015 Mastectomy  . Cancer of breast ([place] 01/[place]/2015  . HOH (hard of hearing)    AIDS  . Hypertension   . Malignant neoplasm of lower-inner quadrant of left breast in female, estrogen receptor negative ([place]     PAST SURGICAL HISTORY :   Past Surgical History:  Procedure Laterality Date  . ABDOMINAL HYSTERECTOMY    . BREAST LUMPECTOMY Right 1998   c Radiation  . CATARACT EXTRACTION W/PHACO Left 08/08/2015   Procedure: CATARACT EXTRACTION PHACO AND INTRAOCULAR LENS PLACEMENT (IOC);  Surgeon: CLeandrew Koyanagi MD;  Location: [facility] ORS;  Service: Ophthalmology;  Laterality: Left;  [place]58.[place] AP% 14.9 CDE 8.60 Fluid Pack lot # [id number]  . CATARACT EXTRACTION W/PHACO Right 12/28/2015   Procedure: CATARACT EXTRACTION PHACO AND INTRAOCULAR LENS PLACEMENT (IOC);  Surgeon: CLeandrew Koyanagi MD;  [facility]  Service: Ophthalmology;  Laterality: Right;  requests early  . JOINT REPLACEMENT Right 2008   knee  . MASTECTOMY Right 2015   After screening in May 2015 - Pt choice    FAMILY HISTORY :  No family history on file.  SOCIAL HISTORY:   Social History  Substance Use Topics  . Smoking status: Never Smoker  . Smokeless tobacco:  Never Used  . Alcohol use No    ALLERGIES:  is allergic to tetracyclines & related.  MEDICATIONS:  Current Outpatient Prescriptions  Medication Sig Dispense Refill  . ALPHA LIPOIC ACID PO Take 1 capsule by mouth daily.     . calcium carbonate (TUMS - DOSED IN MG ELEMENTAL CALCIUM) 500 MG chewable tablet Chew 1 tablet by mouth daily.     . Cholecalciferol (VITAMIN D3 PO) Take 4,000 Units by mouth 2 (two) times daily.    . Coenzyme Q10 (CO Q 10 PO) Take by mouth daily.    .  Glucosamine-Chondroitin (GLUCOSAMINE CHONDR COMPLEX PO) Take 1 tablet by mouth 2 (two) times daily.     Marland Kitchen lisinopril (PRINIVIL,ZESTRIL) 5 MG tablet Take 5 mg by mouth daily.     . Melatonin 3 MG TABS Take 3 mg by mouth as needed.    . palbociclib (IBRANCE) 100 MG capsule Take 1 capsule (100 mg total) by mouth daily with breakfast. Take whole with food. Take for 3 weeks, then 1 week off. 21 capsule 3  . penicillin v potassium (VEETID) 500 MG tablet Take 500 mg by mouth 4 (four) times daily.  1  . RESVERATROL PO Take 1 tablet by mouth daily.      No current facility-administered medications for this visit.     PHYSICAL EXAMINATION: ECOG PERFORMANCE STATUS: 0 - Asymptomatic  BP 128/68 (BP Location: Left Arm, Patient Position: Sitting)   Pulse 80   Temp 97.9 F (36.6 C) (Tympanic)   Resp 16   Wt 114 lb (51.7 kg)   BMI 18.97 kg/m   Filed Weights   09/12/16 1131  Weight: 114 lb (51.7 kg)    GENERAL: Well-nourished well-developed; Alert, no distress and comfortable.  Alone.  EYES: no pallor or icterus OROPHARYNX: no thrush or ulceration; good dentition  NECK: supple, right supraclavicular adenopathy felt.  LYMPH:  no palpable lymphadenopathy in the cervical, axillary or inguinal regions LUNGS: clear to auscultation and  No wheeze or crackles HEART/CVS: regular rate & rhythm and no murmurs; No lower extremity edema ABDOMEN:abdomen soft, non-tender and normal bowel sounds Musculoskeletal:no cyanosis of digits and no clubbing  PSYCH: alert & oriented x 3 with fluent speech NEURO: no focal motor/sensory deficits SKIN:  no rashes or significant lesions  LABORATORY DATA:  I have reviewed the data as listed    Component Value Date/Time   NA 133 (L) 09/12/2016 1053   K 3.9 09/12/2016 1053   CL 97 (L) 09/12/2016 1053   CO2 26 09/12/2016 1053   GLUCOSE 98 09/12/2016 1053   BUN 16 09/12/2016 1053   CREATININE 0.79 09/12/2016 1053   CALCIUM 9.5 09/12/2016 1053   PROT 6.6 09/12/2016  1053   ALBUMIN 4.0 09/12/2016 1053   AST 26 09/12/2016 1053   ALT 20 09/12/2016 1053   ALKPHOS 47 09/12/2016 1053   BILITOT 0.6 09/12/2016 1053   GFRNONAA >60 09/12/2016 1053   GFRAA >60 09/12/2016 1053    No results found for: SPEP, UPEP  Lab Results  Component Value Date   WBC 2.7 (L) 09/12/2016   NEUTROABS 1.7 09/12/2016   HGB 12.7 09/12/2016   HCT 35.9 09/12/2016   MCV 106.9 (H) 09/12/2016   PLT 134 (L) 09/12/2016      Chemistry      Component Value Date/Time   NA 133 (L) 09/12/2016 1053   K 3.9 09/12/2016 1053   CL 97 (L) 09/12/2016 1053   CO2 26 09/12/2016 1053  BUN 16 09/12/2016 1053   CREATININE 0.79 09/12/2016 1053      Component Value Date/Time   CALCIUM 9.5 09/12/2016 1053   ALKPHOS 47 09/12/2016 1053   AST 26 09/12/2016 1053   ALT 20 09/12/2016 1053   BILITOT 0.6 09/12/2016 1053     IMPRESSION: 1. Stable appearance of bilateral lower lobe pulmonary metastases. 2. Decrease in size of right axillary nodal metastasis. The right supraclavicular and prevascular index nodes are no longer measurable. 3. Aortic atherosclerosis   Electronically Signed   By: Kerby Moors M.D.   On: 12/05/2015 10:33  RADIOGRAPHIC STUDIES: I have personally reviewed the radiological images as listed and agreed with the findings in the report. No results found.   ASSESSMENT & PLAN:  Carcinoma of lower-inner quadrant of left breast in female, estrogen receptor positive ([place]) Metastatic ER/PR positive HER-2/neu negative breast cancer currently on Faslodex /ibrance- March 20th 2018- improved Right ax mass;STABLE- pul nodules WBC- 2.7; ANC- 1.7; Hb-12/ platelets- 138; but tumor marker trending up/stable 83. Awaiting from today. Today was her last dose of her ibrance today.  # Given the significant intolerance [arthritis; reflux]- Hold Faslodex. Discussed the mechanism of action at length. We will get a CT scan of the chest abdomen pelvis with contrast- to further decide in  the treatment options.  # Reflux- ? ibrance [pt covinced]; recommend holding ibrance today. Recommend PPI.  # s/p tooth extraction-currently on antibiotics.  # Arthtitis-  Anti-hormone therapy/osteoarthritis. Hold Faslodex.  # HOLD shots today; follow up in 4 weeks/labs- ca-27-29/ shots; CT scan prior.   Orders Placed This Encounter  Procedures  . CT ABDOMEN PELVIS W CONTRAST    Standing Status:   Future    Standing Expiration Date:   12/12/2017    Order Specific Question:   Reason for Exam (SYMPTOM  OR DIAGNOSIS REQUIRED)    Answer:   metastatic breast cancer    Order Specific Question:   Preferred imaging location?    Answer:   [facility]  . CT CHEST W CONTRAST    Standing Status:   Future    Standing Expiration Date:   11/12/2017    Order Specific Question:   Reason for Exam (SYMPTOM  OR DIAGNOSIS REQUIRED)    Answer:   metastatic breast cancer    Order Specific Question:   Preferred imaging location?    Answer:   [facility]      Cammie Sickle, MD 09/12/2016 12:15 PM

## 2016-09-13 LAB — CANCER ANTIGEN 27.29: CAN 27.29: 89.7 U/mL — AB (ref 0.0–38.6)

## 2016-09-26 ENCOUNTER — Ambulatory Visit
Admission: RE | Admit: 2016-09-26 | Discharge: 2016-09-26 | Disposition: A | Discharge status: Home / Self Care | Payer: Medicare HMO | Source: Ambulatory Visit | Attending: Radiation Oncology | Admitting: Radiation Oncology

## 2016-09-26 ENCOUNTER — Encounter: Payer: Self-pay | Admitting: Radiation Oncology

## 2016-09-26 VITALS — BP 169/78 | HR 85 | Temp 97.5°F | Wt 115.4 lb

## 2016-09-26 DIAGNOSIS — Z170 Estrogen receptor positive status [ER+]: Secondary | ICD-10-CM | POA: Diagnosis not present

## 2016-09-26 DIAGNOSIS — C50911 Malignant neoplasm of unspecified site of right female breast: Secondary | ICD-10-CM | POA: Insufficient documentation

## 2016-09-26 DIAGNOSIS — C7951 Secondary malignant neoplasm of bone: Secondary | ICD-10-CM | POA: Insufficient documentation

## 2016-09-26 DIAGNOSIS — C50312 Malignant neoplasm of lower-inner quadrant of left female breast: Secondary | ICD-10-CM

## 2016-09-26 DIAGNOSIS — Z79811 Long term (current) use of aromatase inhibitors: Secondary | ICD-10-CM | POA: Insufficient documentation

## 2016-09-26 DIAGNOSIS — Z923 Personal history of irradiation: Secondary | ICD-10-CM | POA: Diagnosis not present

## 2016-09-26 NOTE — Progress Notes (Signed)
Radiation Oncology Follow up Note  Name: Hayley [name]   Date:   09/26/2016 MRN:  [id number] DOB: 25-Aug-1933    This 83 y.o. female presents to the clinic today for six-month follow-up status post. Palliative radiation therapy to her supra clavicular fossa for metastatic breast cancer.  REFERRING PROVIDER: Lenard Simmer, MD  HPI: Patient is an 83 year old female now out 6 months having completed palliative radiation therapy to her right supra clavicular fossa for metastatic invasive mammary carcinoma. She was having narcotic dependent pain in the right upper extremity and neck which is markedly improved since radiation. She still wears a sleeve for lymphedema as well as having issues with pain in her fingers which has resolved somewhat.  She is currently being followed with pulmonary nodules she's currently receiving I Brandts chemotherapy and Faslodex. She's also has a CT scan of the abdomen chest and pelvis scheduled. Her last PET CT scan performed in March 2018 showed improving hypermetabolic activity in the right supraclavicular right neck region.   COMPLICATIONS OF TREATMENT: none  FOLLOW UP COMPLIANCE: keeps appointments   PHYSICAL EXAM:  BP (!) 169/78   Pulse 85   Temp (!) 97.5 F (36.4 C)   Wt 115 lb 6.6 oz (52.3 kg)   BMI 19.21 kg/m  Well-developed female in NAD. She is status post right modified radical mastectomy. Left breast is free of dominant mass or nodularity in 2 positions examined. No axillary supra clavicular adenopathy is appreciated. Well-developed well-nourished patient in NAD. HEENT reveals PERLA, EOMI, discs not visualized.  Oral cavity is clear. No oral mucosal lesions are identified. Neck is clear without evidence of cervical or supraclavicular adenopathy. Lungs are clear to A&P. Cardiac examination is essentially unremarkable with regular rate and rhythm without murmur rub or thrill. Abdomen is benign with no organomegaly or masses noted. Motor sensory and  DTR levels are equal and symmetric in the upper and lower extremities. Cranial nerves II through XII are grossly intact. Proprioception is intact. No peripheral adenopathy or edema is identified. No motor or sensory levels are noted. Crude visual fields are within normal range.  RADIOLOGY RESULTS: Treat his PET/CT scans reviewed and compatible with the above-stated findings   PLAN: Present time patient is under good palliative control of her stage IV breast cancer. I'm please were overall progress. She continues close follow-up care and continued systemic treatment with medical oncology. I have asked to see her back in 6 months for follow-up. Would be happy to reevaluate the patient any time should further palliative treatment be indicated.  I would like to take this opportunity to thank you for allowing me to participate in the care of your patient.Armstead Peaks., MD

## 2016-10-04 ENCOUNTER — Other Ambulatory Visit: Payer: Self-pay | Admitting: *Deleted

## 2016-10-04 DIAGNOSIS — C50312 Malignant neoplasm of lower-inner quadrant of left female breast: Secondary | ICD-10-CM

## 2016-10-04 DIAGNOSIS — Z170 Estrogen receptor positive status [ER+]: Principal | ICD-10-CM

## 2016-10-04 DIAGNOSIS — R221 Localized swelling, mass and lump, neck: Secondary | ICD-10-CM

## 2016-10-04 NOTE — Progress Notes (Signed)
Pt contacted triage today. Spoke with Cecille Rubin, Therapist, sports. Pt stated that she continues to have right sided neck pain. H/o Right neck mass and pain.  History of breast cancer. Requested that ct neck be added to her imaging next week. Spoke with Dr. Rogue Bussing, who approved to order a ct scan.

## 2016-10-08 ENCOUNTER — Ambulatory Visit
Admission: RE | Admit: 2016-10-08 | Discharge: 2016-10-08 | Disposition: A | Discharge status: Home / Self Care | Payer: Medicare HMO | Source: Ambulatory Visit | Attending: Internal Medicine | Admitting: Internal Medicine

## 2016-10-08 DIAGNOSIS — Z170 Estrogen receptor positive status [ER+]: Secondary | ICD-10-CM

## 2016-10-08 DIAGNOSIS — R221 Localized swelling, mass and lump, neck: Secondary | ICD-10-CM | POA: Diagnosis not present

## 2016-10-08 DIAGNOSIS — M4314 Spondylolisthesis, thoracic region: Secondary | ICD-10-CM | POA: Insufficient documentation

## 2016-10-08 DIAGNOSIS — I6529 Occlusion and stenosis of unspecified carotid artery: Secondary | ICD-10-CM | POA: Diagnosis not present

## 2016-10-08 DIAGNOSIS — C50312 Malignant neoplasm of lower-inner quadrant of left female breast: Secondary | ICD-10-CM

## 2016-10-08 DIAGNOSIS — M47812 Spondylosis without myelopathy or radiculopathy, cervical region: Secondary | ICD-10-CM | POA: Diagnosis not present

## 2016-10-08 DIAGNOSIS — Z853 Personal history of malignant neoplasm of breast: Secondary | ICD-10-CM | POA: Insufficient documentation

## 2016-10-08 MED ORDER — IOPAMIDOL (ISOVUE-300) INJECTION 61%
100.0000 mL | Freq: Once | INTRAVENOUS | Status: AC | PRN
  Administered 2016-10-08: 100 mL via INTRAVENOUS

## 2016-10-10 ENCOUNTER — Inpatient Hospital Stay: Payer: Medicare HMO | Attending: Internal Medicine

## 2016-10-10 ENCOUNTER — Inpatient Hospital Stay: Payer: Medicare HMO

## 2016-10-10 ENCOUNTER — Inpatient Hospital Stay (HOSPITAL_BASED_OUTPATIENT_CLINIC_OR_DEPARTMENT_OTHER): Payer: Medicare HMO | Admitting: Internal Medicine

## 2016-10-10 VITALS — BP 153/75 | HR 90 | Temp 97.8°F | Resp 20 | Ht 65.0 in | Wt 117.0 lb

## 2016-10-10 DIAGNOSIS — C7800 Secondary malignant neoplasm of unspecified lung: Secondary | ICD-10-CM

## 2016-10-10 DIAGNOSIS — C50312 Malignant neoplasm of lower-inner quadrant of left female breast: Secondary | ICD-10-CM | POA: Diagnosis not present

## 2016-10-10 DIAGNOSIS — Z923 Personal history of irradiation: Secondary | ICD-10-CM

## 2016-10-10 DIAGNOSIS — C773 Secondary and unspecified malignant neoplasm of axilla and upper limb lymph nodes: Secondary | ICD-10-CM | POA: Insufficient documentation

## 2016-10-10 DIAGNOSIS — Z9011 Acquired absence of right breast and nipple: Secondary | ICD-10-CM | POA: Insufficient documentation

## 2016-10-10 DIAGNOSIS — Z170 Estrogen receptor positive status [ER+]: Secondary | ICD-10-CM | POA: Insufficient documentation

## 2016-10-10 DIAGNOSIS — I700 Atherosclerosis of aorta: Secondary | ICD-10-CM | POA: Diagnosis not present

## 2016-10-10 DIAGNOSIS — I890 Lymphedema, not elsewhere classified: Secondary | ICD-10-CM

## 2016-10-10 DIAGNOSIS — Z79899 Other long term (current) drug therapy: Secondary | ICD-10-CM | POA: Insufficient documentation

## 2016-10-10 DIAGNOSIS — Z79811 Long term (current) use of aromatase inhibitors: Secondary | ICD-10-CM

## 2016-10-10 DIAGNOSIS — M25511 Pain in right shoulder: Secondary | ICD-10-CM | POA: Insufficient documentation

## 2016-10-10 DIAGNOSIS — I10 Essential (primary) hypertension: Secondary | ICD-10-CM

## 2016-10-10 LAB — CBC WITH DIFFERENTIAL/PLATELET
Basophils Absolute: 0.1 10*3/uL (ref 0–0.1)
Basophils Relative: 1 %
EOS PCT: 9 %
Eosinophils Absolute: 0.4 10*3/uL (ref 0–0.7)
HEMATOCRIT: 37.8 % (ref 35.0–47.0)
Hemoglobin: 12.8 g/dL (ref 12.0–16.0)
LYMPHS ABS: 0.5 10*3/uL — AB (ref 1.0–3.6)
LYMPHS PCT: 10 %
MCH: 35.6 pg — AB (ref 26.0–34.0)
MCHC: 33.8 g/dL (ref 32.0–36.0)
MCV: 105.2 fL — AB (ref 80.0–100.0)
MONO ABS: 0.5 10*3/uL (ref 0.2–0.9)
Monocytes Relative: 10 %
NEUTROS ABS: 3.7 10*3/uL (ref 1.4–6.5)
Neutrophils Relative %: 70 %
PLATELETS: 226 10*3/uL (ref 150–440)
RBC: 3.6 MIL/uL — ABNORMAL LOW (ref 3.80–5.20)
RDW: 14.1 % (ref 11.5–14.5)
WBC: 5.3 10*3/uL (ref 3.6–11.0)

## 2016-10-10 LAB — COMPREHENSIVE METABOLIC PANEL
ALT: 35 U/L (ref 14–54)
ANION GAP: 9 (ref 5–15)
AST: 33 U/L (ref 15–41)
Albumin: 4 g/dL (ref 3.5–5.0)
Alkaline Phosphatase: 50 U/L (ref 38–126)
BILIRUBIN TOTAL: 0.7 mg/dL (ref 0.3–1.2)
BUN: 14 mg/dL (ref 6–20)
CHLORIDE: 100 mmol/L — AB (ref 101–111)
CO2: 25 mmol/L (ref 22–32)
Calcium: 9.3 mg/dL (ref 8.9–10.3)
Creatinine, Ser: 0.75 mg/dL (ref 0.44–1.00)
Glucose, Bld: 140 mg/dL — ABNORMAL HIGH (ref 65–99)
POTASSIUM: 3.7 mmol/L (ref 3.5–5.1)
Sodium: 134 mmol/L — ABNORMAL LOW (ref 135–145)
TOTAL PROTEIN: 6.6 g/dL (ref 6.5–8.1)

## 2016-10-10 NOTE — Progress Notes (Signed)
Patient has not started back on Ibrance - previously held by md x 1 month.  Pt here to discuss her ct scan results.

## 2016-10-10 NOTE — Progress Notes (Signed)
[place] OFFICE PROGRESS NOTE  Patient Care Team: Morayati, [name], MD as PCP - General (Endocrinology)  Cancer of breast [facility])   Staging form: Breast, AJCC 7th Edition     Clinical: Stage IV (T4, N1, M1) - Signed by Forest Gleason, MD on 01/20/2015    Oncology History   # June 2015-RIGHT BREAST CA ? Recurrent- IMC [nipple inverted; T4-skin invol; s/p Mastec; No LN; Dr.Smith ]; ER/PR- Pos 51-90%;Her 2 neu-NEG;Declined RT/Anti-hormone;   #  STAGE IV- Nov 2016- IMC ER-90%; PR-51-90%; her 2 neu-NEG [right ax LN Bx] NOV 2016- IBRANCE + Letrozole; April 2017- PET- RLL/LLL- progression; Supraclav/Ax LN- STABLE/TM- rising;June 20th 2017 FASLODEX+ Ibrance 19m; OCT 2nd- CT C/A/P- STABLE pul mets.   # NOV CT Neck- Progression in brachial plexus area-  RT [s/p RT- middec 2017]  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 [[facility] s/p lumpec;s/p Tam x9 M RT]  # 2015- R UE Lymphedema on Sleeve  # Jan 2018- MMR-STABLE.      Carcinoma of lower-inner quadrant of left breast in [name], estrogen receptor positive ([place]   01/05/2016 Initial Diagnosis    Carcinoma of lower-inner quadrant of left breast in [name], estrogen receptor positive ([place]        INTERVAL HISTORY:   Hayley Don874y.o.  [name] pleasant patient above history of Metastatic ER/PR positive HER-2/neu negative breast cancer currently on ibrance plus Faslodex ; Oligometastatic progression in the brachial plexus right shoulder status post radiation is here for follow-up- Review the results of her restaging CAT scans.  Patient declined continuing Faslodex/ibrance last visit because of-intolerance like arthritis/reflux.  Patient states since stopping ibrance- reflux has improved. She continues to complain of moderate to severe pain in the right shoulder area especially at nighttime; she has been taking Motrin with some help. Patient continues to be very active. She continues to vPsychologist, occupationalat [employer]  Denies any nausea  vomiting. No shortness of breath or chest pain. No cough. She is fairly active. Denies any hot flashes.   REVIEW OF SYSTEMS:  A complete 10 point review of system is done which is negative except mentioned above/history of present illness.   PAST MEDICAL HISTORY :  Past Medical History:  Diagnosis Date  . Arthritis   . Breast cancer ([place] 17[place] years ago   Right Breast c radiation -- 2015 Mastectomy  . Cancer of breast ([place] 01/20/2015  . HOH (hard of hearing)    AIDS  . Hypertension   . Malignant neoplasm of lower-inner quadrant of left breast in [name], estrogen receptor negative ([place]     PAST SURGICAL HISTORY :   Past Surgical History:  Procedure Laterality Date  . ABDOMINAL HYSTERECTOMY    . BREAST LUMPECTOMY Right 1998   c Radiation  . CATARACT EXTRACTION W/PHACO Left 08/08/2015   Procedure: CATARACT EXTRACTION PHACO AND INTRAOCULAR LENS PLACEMENT (IOC);  Surgeon: CLeandrew Koyanagi MD;  Location: [facility] ORS;  Service: Ophthalmology;  Laterality: Left;  [place]58.0 AP% 14.9 CDE 8.60 Fluid Pack lot # [id number]  . CATARACT EXTRACTION W/PHACO Right 12/28/2015   Procedure: CATARACT EXTRACTION PHACO AND INTRAOCULAR LENS PLACEMENT (IOC);  Surgeon: CLeandrew Koyanagi MD;  [facility]  Service: Ophthalmology;  Laterality: Right;  requests early  . JOINT REPLACEMENT Right 2008   knee  . MASTECTOMY Right 2015   After screening in May 2015 - Pt choice    FAMILY HISTORY :  No family history on file.  SOCIAL HISTORY:  Social History  Substance Use Topics  . Smoking status: Never Smoker  . Smokeless tobacco: Never Used  . Alcohol use No    ALLERGIES:  is allergic to tetracyclines & related.  MEDICATIONS:  Current Outpatient Prescriptions  Medication Sig Dispense Refill  . ALPHA LIPOIC ACID PO Take 1 capsule by mouth daily.     . calcium carbonate (TUMS - DOSED IN MG ELEMENTAL CALCIUM) 500 MG chewable tablet Chew 1 tablet by mouth daily.     . Cholecalciferol  (VITAMIN D3 PO) Take 4,000 Units by mouth 2 (two) times daily.    . Coenzyme Q10 (CO Q 10 PO) Take by mouth daily.    . Glucosamine-Chondroitin (GLUCOSAMINE CHONDR COMPLEX PO) Take 1 tablet by mouth 2 (two) times daily.     Marland Kitchen lisinopril (PRINIVIL,ZESTRIL) 5 MG tablet Take 5 mg by mouth daily.     . Melatonin 3 MG TABS Take 3 mg by mouth as needed.    . penicillin v potassium (VEETID) 500 MG tablet Take 500 mg by mouth 4 (four) times daily.  1  . RESVERATROL PO Take 1 tablet by mouth daily.     . palbociclib (IBRANCE) 100 MG capsule Take 1 capsule (100 mg total) by mouth daily with breakfast. Take whole with food. Take for 3 weeks, then 1 week off. (Patient not taking: Reported on 10/10/2016) 21 capsule 3   No current facility-administered medications for this visit.     PHYSICAL EXAMINATION: ECOG PERFORMANCE STATUS: 0 - Asymptomatic  BP (!) 153/75 (BP Location: Left Arm, Patient Position: Sitting)   Pulse 90   Temp 97.8 F (36.6 C) (Tympanic)   Resp 20   Ht '5\' 5"'  (1.651 m)   Wt 117 lb (53.1 kg)   BMI 19.47 kg/m   Filed Weights   10/10/16 1423  Weight: 117 lb (53.1 kg)    GENERAL: Well-nourished well-developed; Alert, no distress and comfortable.  Alone.  EYES: no pallor or icterus OROPHARYNX: no thrush or ulceration; good dentition  NECK: supple, right supraclavicular adenopathy felt.  LYMPH:  no palpable lymphadenopathy in the cervical, axillary or inguinal regions LUNGS: clear to auscultation and  No wheeze or crackles HEART/CVS: regular rate & rhythm and no murmurs; No lower extremity edema ABDOMEN:abdomen soft, non-tender and normal bowel sounds Musculoskeletal:no cyanosis of digits and no clubbing  PSYCH: alert & oriented x 3 with fluent speech NEURO: no focal motor/sensory deficits SKIN:  no rashes or significant lesions  LABORATORY DATA:  I have reviewed the data as listed    Component Value Date/Time   NA 134 (L) 10/10/2016 1355   K 3.7 10/10/2016 1355   CL 100  (L) 10/10/2016 1355   CO2 25 10/10/2016 1355   GLUCOSE 140 (H) 10/10/2016 1355   BUN 14 10/10/2016 1355   CREATININE 0.75 10/10/2016 1355   CALCIUM 9.3 10/10/2016 1355   PROT 6.6 10/10/2016 1355   ALBUMIN 4.0 10/10/2016 1355   AST 33 10/10/2016 1355   ALT 35 10/10/2016 1355   ALKPHOS 50 10/10/2016 1355   BILITOT 0.7 10/10/2016 1355   GFRNONAA >60 10/10/2016 1355   GFRAA >60 10/10/2016 1355    No results found for: SPEP, UPEP  Lab Results  Component Value Date   WBC 5.3 10/10/2016   NEUTROABS 3.7 10/10/2016   HGB 12.8 10/10/2016   HCT 37.8 10/10/2016   MCV 105.2 (H) 10/10/2016   PLT 226 10/10/2016      Chemistry      Component Value  Date/Time   NA 134 (L) 10/10/2016 1355   K 3.7 10/10/2016 1355   CL 100 (L) 10/10/2016 1355   CO2 25 10/10/2016 1355   BUN 14 10/10/2016 1355   CREATININE 0.75 10/10/2016 1355      Component Value Date/Time   CALCIUM 9.3 10/10/2016 1355   ALKPHOS 50 10/10/2016 1355   AST 33 10/10/2016 1355   ALT 35 10/10/2016 1355   BILITOT 0.7 10/10/2016 1355     IMPRESSION: 1. Stable appearance of bilateral lower lobe pulmonary metastases. 2. Decrease in size of right axillary nodal metastasis. The right supraclavicular and prevascular index nodes are no longer measurable. 3. Aortic atherosclerosis   Electronically Signed   By: Kerby Moors M.D.   On: 12/05/2015 10:33  RADIOGRAPHIC STUDIES: I have personally reviewed the radiological images as listed and agreed with the findings in the report. No results found.   ASSESSMENT & PLAN:  Carcinoma of lower-inner quadrant of left breast in [name], estrogen receptor positive ([place]) Metastatic ER/PR positive HER-2/neu negative breast cancer currently on Faslodex /ibrance- March 20th 2018. Patient stopped Faslodex/ibrance-secondary to intolerance approximately 1 month ago. AUG 2018-3rd CT scan- progression of the right brachial plexus lesion; increasing lung nodules.   # Long discussion with  the patient regarding treatment options- including chemotherapy like Taxol/Xeloda and others. Also discussed option of Aromasin-Afinitor.  #  I discussed at length the benefits of treatment although not curative would be palliative-help her pain; and also in the process help to live longer. Unfortunately, patient focused on the side effect profile rather than the therapeutic benefit. Along the same lines- as all the treatments of palliative; patient is reluctant to proceed with any treatment at this time. I think is a patient to bring the family to discuss the options one more time. She finally agrees to bring her daughter the next visit.   # Worsening pain right shoulder- secondary involvement of brachial plexus; status post prior radiation. Patient not open to any radiation options at this time. Wants to avoid any narcotics. Recommend continued use of Motrin 2-3 times a day by Tylenol.   # Also discussed that her life expectancy in absence of any active therapy- 4-6 months.   # HOLD faslodex/ ibrance at this time; will discuss treatment options at next visit with her daughter.  # # I reviewed the blood work- with the patient in detail; also reviewed the imaging independently [as summarized above]; and with the patient in detail.    No orders of the defined types were placed in this encounter.     Hayley Sickle, MD 10/11/2016 8:10 AM

## 2016-10-10 NOTE — Assessment & Plan Note (Addendum)
Metastatic ER/PR positive HER-2/neu negative breast cancer currently on Faslodex /ibrance- March 20th 2018. Patient stopped Faslodex/ibrance-secondary to intolerance approximately 1 month ago. AUG 2018-3rd CT scan- progression of the right brachial plexus lesion; increasing lung nodules.   # Long discussion with the patient regarding treatment options- including chemotherapy like Taxol/Xeloda and others. Also discussed option of Aromasin-Afinitor.  #  I discussed at length the benefits of treatment although not curative would be palliative-help her pain; and also in the process help to live longer. Unfortunately, patient focused on the side effect profile rather than the therapeutic benefit. Along the same lines- as all the treatments of palliative; patient is reluctant to proceed with any treatment at this time. I think is a patient to bring the family to discuss the options one more time. She finally agrees to bring her daughter the next visit.   # Worsening pain right shoulder- secondary involvement of brachial plexus; status post prior radiation. Patient not open to any radiation options at this time. Wants to avoid any narcotics. Recommend continued use of Motrin 2-3 times a day by Tylenol.   # Also discussed that her life expectancy in absence of any active therapy- 4-6 months.   # HOLD faslodex/ ibrance at this time; will discuss treatment options at next visit with her daughter.  # # I reviewed the blood work- with the patient in detail; also reviewed the imaging independently [as summarized above]; and with the patient in detail.   # 40 minutes face-to-face with the patient discussing the above plan of care; more than 50% of time spent on prognosis/ natural history; counseling and coordination.

## 2016-10-11 ENCOUNTER — Telehealth: Payer: Self-pay | Admitting: Internal Medicine

## 2016-10-11 LAB — CANCER ANTIGEN 27.29: CA 27.29: 92.2 U/mL — ABNORMAL HIGH (ref 0.0–38.6)

## 2016-10-11 NOTE — Telephone Encounter (Signed)
Please schedule -Follow-up with M.D.; no labs; week of August 20th.

## 2016-10-11 NOTE — Telephone Encounter (Signed)
msg sent to scheduling team. 

## 2016-10-25 ENCOUNTER — Telehealth: Payer: Self-pay | Admitting: *Deleted

## 2016-10-25 NOTE — Telephone Encounter (Signed)
I personally contacted patient. Left vm- we have an opening tomorrow on schedule at 130pm for labs and 145pm for md apt. I asked patient to see if she would be willing to change her apt time.

## 2016-10-26 ENCOUNTER — Inpatient Hospital Stay (HOSPITAL_BASED_OUTPATIENT_CLINIC_OR_DEPARTMENT_OTHER): Payer: Medicare HMO | Admitting: Internal Medicine

## 2016-10-26 ENCOUNTER — Inpatient Hospital Stay: Payer: Medicare HMO

## 2016-10-26 VITALS — BP 169/78 | HR 84 | Temp 97.8°F | Resp 20 | Ht 65.0 in | Wt 114.0 lb

## 2016-10-26 DIAGNOSIS — C50312 Malignant neoplasm of lower-inner quadrant of left female breast: Secondary | ICD-10-CM

## 2016-10-26 DIAGNOSIS — Z923 Personal history of irradiation: Secondary | ICD-10-CM

## 2016-10-26 DIAGNOSIS — Z170 Estrogen receptor positive status [ER+]: Secondary | ICD-10-CM | POA: Diagnosis not present

## 2016-10-26 DIAGNOSIS — Z9011 Acquired absence of right breast and nipple: Secondary | ICD-10-CM | POA: Diagnosis not present

## 2016-10-26 DIAGNOSIS — Z79811 Long term (current) use of aromatase inhibitors: Secondary | ICD-10-CM | POA: Diagnosis not present

## 2016-10-26 DIAGNOSIS — C773 Secondary and unspecified malignant neoplasm of axilla and upper limb lymph nodes: Secondary | ICD-10-CM

## 2016-10-26 DIAGNOSIS — I10 Essential (primary) hypertension: Secondary | ICD-10-CM

## 2016-10-26 DIAGNOSIS — I890 Lymphedema, not elsewhere classified: Secondary | ICD-10-CM

## 2016-10-26 DIAGNOSIS — M25511 Pain in right shoulder: Secondary | ICD-10-CM | POA: Diagnosis not present

## 2016-10-26 DIAGNOSIS — I700 Atherosclerosis of aorta: Secondary | ICD-10-CM | POA: Diagnosis not present

## 2016-10-26 DIAGNOSIS — C7800 Secondary malignant neoplasm of unspecified lung: Secondary | ICD-10-CM

## 2016-10-26 DIAGNOSIS — Z79899 Other long term (current) drug therapy: Secondary | ICD-10-CM | POA: Diagnosis not present

## 2016-10-26 LAB — COMPREHENSIVE METABOLIC PANEL
ALK PHOS: 47 U/L (ref 38–126)
ALT: 29 U/L (ref 14–54)
ANION GAP: 11 (ref 5–15)
AST: 28 U/L (ref 15–41)
Albumin: 4.4 g/dL (ref 3.5–5.0)
BUN: 16 mg/dL (ref 6–20)
CALCIUM: 9.4 mg/dL (ref 8.9–10.3)
CO2: 25 mmol/L (ref 22–32)
Chloride: 98 mmol/L — ABNORMAL LOW (ref 101–111)
Creatinine, Ser: 0.78 mg/dL (ref 0.44–1.00)
GFR calc non Af Amer: >60 mL/min (ref >60)
Glucose, Bld: 130 mg/dL — ABNORMAL HIGH (ref 65–99)
Potassium: 4 mmol/L (ref 3.5–5.1)
Sodium: 134 mmol/L — ABNORMAL LOW (ref 135–145)
Total Bilirubin: 0.6 mg/dL (ref 0.3–1.2)
Total Protein: 6.8 g/dL (ref 6.5–8.1)

## 2016-10-26 LAB — CBC WITH DIFFERENTIAL/PLATELET
BASOS ABS: 0.1 10*3/uL (ref 0–0.1)
BASOS PCT: 1 %
EOS PCT: 7 %
Eosinophils Absolute: 0.3 10*3/uL (ref 0–0.7)
HCT: 38.5 % (ref 35.0–47.0)
HEMOGLOBIN: 13.3 g/dL (ref 12.0–16.0)
Lymphocytes Relative: 14 %
Lymphs Abs: 0.7 10*3/uL — ABNORMAL LOW (ref 1.0–3.6)
MCH: 34.6 pg — AB (ref 26.0–34.0)
MCHC: 34.4 g/dL (ref 32.0–36.0)
MCV: 100.5 fL — ABNORMAL HIGH (ref 80.0–100.0)
MONO ABS: 0.6 10*3/uL (ref 0.2–0.9)
Monocytes Relative: 12 %
Neutro Abs: 3.4 10*3/uL (ref 1.4–6.5)
Neutrophils Relative %: 66 %
PLATELETS: 208 10*3/uL (ref 150–440)
RBC: 3.84 MIL/uL (ref 3.80–5.20)
RDW: 14.4 % (ref 11.5–14.5)
WBC: 5.1 10*3/uL (ref 3.6–11.0)

## 2016-10-26 MED ORDER — MELOXICAM 7.5 MG PO TABS: 7.5000 mg | ORAL_TABLET | Freq: Every day | ORAL | 4 refills | Status: DC

## 2016-10-26 NOTE — Assessment & Plan Note (Addendum)
Metastatic ER/PR positive HER-2/neu negative breast cancer most recently on Faslodex /ibrance- March 20th 2018. Patient stopped Faslodex/ibrance-secondary to intolerance in July 2018. AUG 2018-3rd CT scan- progression of the right brachial plexus lesion; increasing lung nodules.   # Long discussion with the patient/her daughter regarding treatment options- including chemotherapy like Taxol/Xeloda and others. Also discussed option of Aromasin-Afinitor. I reviewed the potential side effects of each option.  # I again reviewed the options would be-palliative not curative. After lengthy discussion patient still is not convinced regarding the overall treatment- especially if it is not curative. She is very concerned of side effects. Still reluctant with proceeding any treatment.   # Worsening pain right shoulder- secondary involvement of brachial plexus; status post prior radiation. Patient not open to any radiation options at this time. Wants to avoid any narcotics. Recommend continued use of Motrin 2-3 times a day; also add tylenol.   # Also reviewed regarding palliative care services/hospice services. Patient has not made any decisions yet. Discussed that life expectancy would be less than 6 months in absence of treatments.  # follow up in 4 weeks/labs. She will call re: her plans if she wants to initiate treatment.  # I reviewed the blood work- with the patient/daughter in detail; also reviewed the imaging independently [as summarized above]; and with the patient/daughter in detail.

## 2016-10-26 NOTE — Progress Notes (Signed)
[place] OFFICE PROGRESS NOTE  Patient Care Team: [name], Hayley Sledge, MD as PCP - General (Endocrinology)  Cancer of breast [facility])   Staging form: Breast, AJCC 7th Edition     Clinical: Stage IV (T4, N1, M1) - Signed by Hayley Gleason, MD on 01/20/2015    Oncology History   # June 2015-RIGHT BREAST CA ? Recurrent- IMC [nipple inverted; T4-skin invol; s/p Mastec; No LN; Dr.Smith ]; ER/PR- Pos 51-90%;Her 2 neu-NEG;Declined RT/Anti-hormone;   #  STAGE IV- Nov 2016- IMC ER-90%; PR-51-90%; her 2 neu-NEG [right ax LN Bx] NOV 2016- IBRANCE + Letrozole; April 2017- PET- RLL/LLL- progression; Supraclav/Ax LN- STABLE/TM- rising;June 20th 2017 FASLODEX+ Ibrance 131m; OCT 2nd- CT C/A/P- STABLE pul mets.   # NOV CT Neck- Progression in brachial plexus area-  RT [s/p RT- middec 2017]  # AUG 2018- PROGRESSION of right brach plexus/ bil lung nodules; STOP Faslodex-Ibrance  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 [[facility] s/p lumpec;s/p Tam x9 M RT]  # 2015- R UE Lymphedema on Sleeve  # Jan 2018- MMR-STABLE.      Carcinoma of lower-inner quadrant of left breast in female, estrogen receptor positive ([place]      INTERVAL HISTORY:   [name]89y.o.  female pleasant patient above history of Metastatic ER/PR positive HER-2/neu negative breast cancer Most recently on ibrance plus Faslodex ; progression in the brachial plexus/Right shoulder and also bilateral lung nodules- is here to discuss treatment options. She is accompanied by her daughter.  Patient declined continuing Faslodex/ibrance last visit because of-intolerance like arthritis/reflux. Unfortunately she continues to have moderate to severe pain in the right shoulder area shooting down her right arm. She has been taking Tylenol only with minimal benefit. She feels the pain is worse at nighttime.  Denies any nausea vomiting. No shortness of breath or chest pain. No cough. She is fairly active. Denies any hot flashes.   REVIEW OF SYSTEMS:   A complete 10 point review of system is done which is negative except mentioned above/history of present illness.   PAST MEDICAL HISTORY :  Past Medical History:  Diagnosis Date  . Arthritis   . Breast cancer ([facility] 17[place] ago   Right Breast c radiation -- 2015 Mastectomy  . [place]  . HOH (hard of hearing)    AIDS  . Hypertension   . Malignant neoplasm of lower-inner quadrant of left breast in female, estrogen receptor negative ([place]     PAST SURGICAL HISTORY :   Past Surgical History:  Procedure Laterality Date  . ABDOMINAL HYSTERECTOMY    . BREAST LUMPECTOMY Right 1998   c Radiation  . CATARACT EXTRACTION W/PHACO Left 08/08/2015   Procedure: CATARACT EXTRACTION PHACO AND INTRAOCULAR LENS PLACEMENT (IOC);  Surgeon: CLeandrew Koyanagi MD;  Location: [facility] ORS;  Service: Ophthalmology;  Laterality: Left;  [place]58[place] AP% 14.9 CDE 8.60 Fluid Pack lot # [id number]  . CATARACT EXTRACTION W/PHACO Right 12/28/2015   Procedure: CATARACT EXTRACTION PHACO AND INTRAOCULAR LENS PLACEMENT (IOC);  Surgeon: CLeandrew Koyanagi MD;  [facility] [facility]  Service: Ophthalmology;  Laterality: Right;  requests early  . JOINT REPLACEMENT Right 2008   knee  . MASTECTOMY Right 2015   After screening in May 2015 - Pt choice    FAMILY HISTORY :  No family history on file.  SOCIAL HISTORY:   Social History  Substance Use Topics  . Smoking status: Never Smoker  . Smokeless tobacco: Never Used  .  Alcohol use No    ALLERGIES:  is allergic to tetracyclines & related.  MEDICATIONS:  Current Outpatient Prescriptions  Medication Sig Dispense Refill  . ALPHA LIPOIC ACID PO Take 1 capsule by mouth daily.     . calcium carbonate (TUMS - DOSED IN MG ELEMENTAL CALCIUM) 500 MG chewable tablet Chew 1 tablet by mouth daily.     . Cholecalciferol (VITAMIN D3 PO) Take 4,000 Units by mouth 2 (two) times daily.    . Coenzyme Q10 (CO Q 10 PO) Take by mouth daily.    .  Glucosamine-Chondroitin (GLUCOSAMINE CHONDR COMPLEX PO) Take 1 tablet by mouth 2 (two) times daily.     Marland Kitchen lisinopril (PRINIVIL,ZESTRIL) 5 MG tablet Take 5 mg by mouth daily.     . Melatonin 3 MG TABS Take 3 mg by mouth as needed.    . penicillin v potassium (VEETID) 500 MG tablet Take 500 mg by mouth 4 (four) times daily.  1  . RESVERATROL PO Take 1 tablet by mouth daily.     . meloxicam (MOBIC) 7.5 MG tablet Take 1 tablet (7.5 mg total) by mouth daily. 30 tablet 4   No current facility-administered medications for this visit.     PHYSICAL EXAMINATION: ECOG PERFORMANCE STATUS: 0 - Asymptomatic  BP (!) 169/78   Pulse 84   Temp 97.8 F (36.6 C) (Tympanic)   Resp 20   Ht '5\' 5"'  (1.651 m)   Wt 114 lb (51.7 kg)   BMI 18.97 kg/m   Filed Weights   10/26/16 1459  Weight: 114 lb (51.7 kg)    GENERAL: Well-nourished well-developed; Alert, no distress and comfortable. Accompanied by daughter. EYES: no pallor or icterus OROPHARYNX: no thrush or ulceration; good dentition  NECK: supple, right supraclavicular adenopathy felt.  LYMPH:  no palpable lymphadenopathy in the cervical, axillary or inguinal regions LUNGS: clear to auscultation and  No wheeze or crackles HEART/CVS: regular rate & rhythm and no murmurs; No lower extremity edema ABDOMEN:abdomen soft, non-tender and normal bowel sounds Musculoskeletal:no cyanosis of digits and no clubbing  PSYCH: alert & oriented x 3 with fluent speech NEURO: no focal motor/sensory deficits SKIN:  no rashes or significant lesions  LABORATORY DATA:  I have reviewed the data as listed    Component Value Date/Time   NA 134 (L) 10/26/2016 1430   K 4.0 10/26/2016 1430   CL 98 (L) 10/26/2016 1430   CO2 25 10/26/2016 1430   GLUCOSE 130 (H) 10/26/2016 1430   BUN 16 10/26/2016 1430   CREATININE 0.78 10/26/2016 1430   CALCIUM 9.4 10/26/2016 1430   PROT 6.8 10/26/2016 1430   ALBUMIN 4.4 10/26/2016 1430   AST 28 10/26/2016 1430   ALT 29 10/26/2016  1430   ALKPHOS 47 10/26/2016 1430   BILITOT 0.6 10/26/2016 1430   GFRNONAA >60 10/26/2016 1430   GFRAA >60 10/26/2016 1430    No results found for: SPEP, UPEP  Lab Results  Component Value Date   WBC 5.1 10/26/2016   NEUTROABS 3.4 10/26/2016   HGB 13.3 10/26/2016   HCT 38.5 10/26/2016   MCV 100.5 (H) 10/26/2016   PLT 208 10/26/2016      Chemistry      Component Value Date/Time   NA 134 (L) 10/26/2016 1430   K 4.0 10/26/2016 1430   CL 98 (L) 10/26/2016 1430   CO2 25 10/26/2016 1430   BUN 16 10/26/2016 1430   CREATININE 0.78 10/26/2016 1430      Component Value Date/Time  CALCIUM 9.4 10/26/2016 1430   ALKPHOS 47 10/26/2016 1430   AST 28 10/26/2016 1430   ALT 29 10/26/2016 1430   BILITOT 0.6 10/26/2016 1430    IMPRESSION: 1. Today's study demonstrates progression of metastatic disease with increased number and size of numerous pulmonary metastases, as well as increasingly prominent soft tissue extending from the right brachial plexus into the right axillary region, as detailed above. No new metastatic disease in the abdomen or pelvis. 2.  Aortic atherosclerosis. 3. Additional incidental findings, as above. Aortic Atherosclerosis (ICD10-I70.0).   Electronically Signed   By: Vinnie Langton M.D.   On: 10/08/2016 15:06 ---------------------------------------------------------------------------------------------- Increasing infiltrative soft tissue along RIGHT brachials plexus representing lymphadenopathy and/or perineural tumor. Apparent worsening may reflect post treatment related changes and lymphedema though tumor progression is possible.   Electronically Signed   By: Elon Alas M.D.   On: 10/08/2016 14:37  RADIOGRAPHIC STUDIES: I have personally reviewed the radiological images as listed and agreed with the findings in the report. No results found.   ASSESSMENT & PLAN:  Carcinoma of lower-inner quadrant of left breast in female, estrogen  receptor positive ([place]) Metastatic ER/PR positive HER-2/neu negative breast cancer most recently on Faslodex /ibrance- March 20th 2018. Patient stopped Faslodex/ibrance-secondary to intolerance in July 2018. AUG 2018-3rd CT scan- progression of the right brachial plexus lesion; increasing lung nodules.   # Long discussion with the patient/her daughter regarding treatment options- including chemotherapy like Taxol/Xeloda and others. Also discussed option of Aromasin-Afinitor. I reviewed the potential side effects of each option.  # I again reviewed the options would be-palliative not curative. After lengthy discussion patient still is not convinced regarding the overall treatment- especially if it is not curative. She is very concerned of side effects. Still reluctant with proceeding any treatment.   # Worsening pain right shoulder- secondary involvement of brachial plexus; status post prior radiation. Patient not open to any radiation options at this time. Wants to avoid any narcotics. Recommend continued use of Motrin 2-3 times a day; also add tylenol.   # Also reviewed regarding palliative care services/hospice services. Patient has not made any decisions yet. Discussed that life expectancy would be less than 6 months in absence of treatments.  # follow up in 4 weeks/labs. She will call re: her plans if she wants to initiate treatment.  # I reviewed the blood work- with the patient/daughter in detail; also reviewed the imaging independently [as summarized above]; and with the patient/daughter in detail.    No orders of the defined types were placed in this encounter.     Cammie Sickle, MD 10/28/2016 8:08 PM

## 2016-10-27 LAB — CANCER ANTIGEN 27.29: CA 27.29: 93.2 U/mL — ABNORMAL HIGH (ref 0.0–38.6)

## 2016-11-26 ENCOUNTER — Inpatient Hospital Stay: Payer: Medicare HMO | Attending: Internal Medicine | Admitting: Internal Medicine

## 2016-11-26 ENCOUNTER — Inpatient Hospital Stay: Payer: Medicare HMO

## 2016-11-26 VITALS — BP 180/77 | HR 78 | Temp 97.9°F | Resp 20 | Ht 65.0 in | Wt 116.0 lb

## 2016-11-26 DIAGNOSIS — I700 Atherosclerosis of aorta: Secondary | ICD-10-CM | POA: Insufficient documentation

## 2016-11-26 DIAGNOSIS — K219 Gastro-esophageal reflux disease without esophagitis: Secondary | ICD-10-CM | POA: Insufficient documentation

## 2016-11-26 DIAGNOSIS — C50312 Malignant neoplasm of lower-inner quadrant of left female breast: Secondary | ICD-10-CM

## 2016-11-26 DIAGNOSIS — Z9221 Personal history of antineoplastic chemotherapy: Secondary | ICD-10-CM

## 2016-11-26 DIAGNOSIS — M25511 Pain in right shoulder: Secondary | ICD-10-CM | POA: Insufficient documentation

## 2016-11-26 DIAGNOSIS — I10 Essential (primary) hypertension: Secondary | ICD-10-CM | POA: Diagnosis not present

## 2016-11-26 DIAGNOSIS — Z170 Estrogen receptor positive status [ER+]: Secondary | ICD-10-CM | POA: Insufficient documentation

## 2016-11-26 DIAGNOSIS — I890 Lymphedema, not elsewhere classified: Secondary | ICD-10-CM | POA: Diagnosis not present

## 2016-11-26 DIAGNOSIS — Z923 Personal history of irradiation: Secondary | ICD-10-CM | POA: Diagnosis not present

## 2016-11-26 LAB — CBC WITH DIFFERENTIAL/PLATELET
BASOS PCT: 1 %
Basophils Absolute: 0 10*3/uL (ref 0–0.1)
EOS PCT: 3 %
Eosinophils Absolute: 0.1 10*3/uL (ref 0–0.7)
HEMATOCRIT: 37.5 % (ref 35.0–47.0)
Hemoglobin: 13 g/dL (ref 12.0–16.0)
LYMPHS PCT: 13 %
Lymphs Abs: 0.6 10*3/uL — ABNORMAL LOW (ref 1.0–3.6)
MCH: 33.5 pg (ref 26.0–34.0)
MCHC: 34.6 g/dL (ref 32.0–36.0)
MCV: 96.8 fL (ref 80.0–100.0)
MONO ABS: 0.5 10*3/uL (ref 0.2–0.9)
MONOS PCT: 12 %
Neutro Abs: 3.2 10*3/uL (ref 1.4–6.5)
Neutrophils Relative %: 71 %
PLATELETS: 207 10*3/uL (ref 150–440)
RBC: 3.88 MIL/uL (ref 3.80–5.20)
RDW: 14.3 % (ref 11.5–14.5)
WBC: 4.5 10*3/uL (ref 3.6–11.0)

## 2016-11-26 LAB — COMPREHENSIVE METABOLIC PANEL
ALT: 27 U/L (ref 14–54)
ANION GAP: 8 (ref 5–15)
AST: 27 U/L (ref 15–41)
Albumin: 4 g/dL (ref 3.5–5.0)
Alkaline Phosphatase: 48 U/L (ref 38–126)
BILIRUBIN TOTAL: 0.6 mg/dL (ref 0.3–1.2)
BUN: 17 mg/dL (ref 6–20)
CHLORIDE: 101 mmol/L (ref 101–111)
CO2: 25 mmol/L (ref 22–32)
Calcium: 9.1 mg/dL (ref 8.9–10.3)
Creatinine, Ser: 0.74 mg/dL (ref 0.44–1.00)
GFR calc Af Amer: >60 mL/min (ref >60)
GFR calc non Af Amer: >60 mL/min (ref >60)
Glucose, Bld: 90 mg/dL (ref 65–99)
POTASSIUM: 4 mmol/L (ref 3.5–5.1)
Sodium: 134 mmol/L — ABNORMAL LOW (ref 135–145)
TOTAL PROTEIN: 6.5 g/dL (ref 6.5–8.1)

## 2016-11-26 MED ORDER — GABAPENTIN 100 MG PO CAPS: 100.0000 mg | ORAL_CAPSULE | Freq: Two times a day (BID) | ORAL | 2 refills | Status: DC

## 2016-11-26 NOTE — Progress Notes (Signed)
[place] OFFICE PROGRESS NOTE  Patient Care Team: Morayati, [name], MD as PCP - General (Endocrinology)  Cancer of breast [facility])   Staging form: Breast, AJCC 7th Edition     Clinical: Stage IV (T4, N1, M1) - Signed by Forest Gleason, MD on 01/20/2015    Oncology History   # June 2015-RIGHT BREAST CA ? Recurrent- IMC [nipple inverted; T4-skin invol; s/p Mastec; No LN; Dr.Smith ]; ER/PR- Pos 51-90%;Her 2 neu-NEG;Declined RT/Anti-hormone;   #  STAGE IV- Nov 2016- IMC ER-90%; PR-51-90%; her 2 neu-NEG [right ax LN Bx] NOV 2016- IBRANCE + Letrozole; April 2017- PET- RLL/LLL- progression; Supraclav/Ax LN- STABLE/TM- rising;June 20th 2017 FASLODEX+ Ibrance '100mg'$ ; OCT 2nd- CT C/A/P- STABLE pul mets.   # NOV CT Neck- Progression in brachial plexus area-  RT [s/p RT- middec 2017]  # AUG 2018- PROGRESSION of right brach plexus/ bil lung nodules; STOP Faslodex-Ibrance  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 [[facility] s/p lumpec;s/p Tam x9 M RT]  # 2015- R UE Lymphedema on Sleeve  # Jan 2018- MMR-STABLE.      Carcinoma of lower-inner quadrant of left breast in female, estrogen receptor positive ([place])    INTERVAL HISTORY:   Hayley [name] 83 y.o.  female pleasant patient above history of Metastatic ER/PR positive HER-2/neu negative breast cancer- Recent progression noted on CT scans in August 2018 is here for follow-up.  Patient declined any further treatments- as the treatments are all palliative; not curative. She feels the treatments interrupt/her quality of life.  She stopped taking Mobic as this is causing reflux.  Unfortunately she continues to have moderate to severe pain in the right shoulder area shooting down her right arm. She has been taking Tylenol 500 mg twice a day.  Denies any nausea vomiting. No shortness of breath or chest pain. No cough. She is fairly active. Denies any hot flashes.   REVIEW OF SYSTEMS:  A complete 10 point review of system is done which is negative except  mentioned above/history of present illness.   PAST MEDICAL HISTORY :  Past Medical History:  Diagnosis Date  . Arthritis   . Breast cancer ([place]) 17+ years ago   Right Breast c radiation -- 2015 Mastectomy  . Cancer of breast ([place]) 01/20/2015  . HOH (hard of hearing)    AIDS  . Hypertension   . Malignant neoplasm of lower-inner quadrant of left breast in female, estrogen receptor negative ([place])     PAST SURGICAL HISTORY :   Past Surgical History:  Procedure Laterality Date  . ABDOMINAL HYSTERECTOMY    . BREAST LUMPECTOMY Right 1998   c Radiation  . CATARACT EXTRACTION W/PHACO Left 08/08/2015   Procedure: CATARACT EXTRACTION PHACO AND INTRAOCULAR LENS PLACEMENT (IOC);  Surgeon: Leandrew Koyanagi, MD;  Location: [facility] ORS;  Service: Ophthalmology;  Laterality: Left;  [place] 58.0 AP% 14.9 CDE 8.60 Fluid Pack lot # [id number]  . CATARACT EXTRACTION W/PHACO Right 12/28/2015   Procedure: CATARACT EXTRACTION PHACO AND INTRAOCULAR LENS PLACEMENT (IOC);  Surgeon: Leandrew Koyanagi, MD;  Location: [place];  Service: Ophthalmology;  Laterality: Right;  requests early  . JOINT REPLACEMENT Right 2008   knee  . MASTECTOMY Right 2015   After screening in May 2015 - Pt choice    FAMILY HISTORY :  No family history on file.  SOCIAL HISTORY:   Social History  Substance Use Topics  . Smoking status: Never Smoker  . Smokeless tobacco: Never Used  . Alcohol use  No    ALLERGIES:  is allergic to tetracyclines & related.  MEDICATIONS:  Current Outpatient Prescriptions  Medication Sig Dispense Refill  . ALPHA LIPOIC ACID PO Take 1 capsule by mouth daily.     . calcium carbonate (TUMS - DOSED IN MG ELEMENTAL CALCIUM) 500 MG chewable tablet Chew 1 tablet by mouth daily.     . Cholecalciferol (VITAMIN D3 PO) Take 4,000 Units by mouth 2 (two) times daily.    . Coenzyme Q10 (CO Q 10 PO) Take by mouth daily.    . Glucosamine-Chondroitin (GLUCOSAMINE CHONDR COMPLEX PO) Take 1 tablet by  mouth 2 (two) times daily.     Marland Kitchen lisinopril (PRINIVIL,ZESTRIL) 5 MG tablet Take 5 mg by mouth daily.     . Melatonin 3 MG TABS Take 3 mg by mouth as needed.    . penicillin v potassium (VEETID) 500 MG tablet Take 500 mg by mouth 4 (four) times daily.  1  . RESVERATROL PO Take 1 tablet by mouth daily.     Marland Kitchen gabapentin (NEURONTIN) 100 MG capsule Take 1 capsule (100 mg total) by mouth 2 (two) times daily. X 1 week; if tolerating well- then take three times a day 90 capsule 2   No current facility-administered medications for this visit.     PHYSICAL EXAMINATION: ECOG PERFORMANCE STATUS: 0 - Asymptomatic  BP (!) 180/77 (Patient Position: Sitting)   Pulse 78   Temp 97.9 F (36.6 C) (Tympanic)   Resp 20   Ht _0  (1.651 m)   Wt 116 lb (52.6 kg)   BMI 19.30 kg/m   Filed Weights   11/26/16 1157  Weight: 116 lb (52.6 kg)    GENERAL: Well-nourished well-developed; Alert, no distress and comfortable. Accompanied by daughter. EYES: no pallor or icterus OROPHARYNX: no thrush or ulceration; good dentition  NECK: supple, right supraclavicular adenopathy felt.  LYMPH:  no palpable lymphadenopathy in the cervical, axillary or inguinal regions LUNGS: clear to auscultation and  No wheeze or crackles HEART/CVS: regular rate & rhythm and no murmurs; No lower extremity edema ABDOMEN:abdomen soft, non-tender and normal bowel sounds Musculoskeletal:no cyanosis of digits and no clubbing  PSYCH: alert & oriented x 3 with fluent speech NEURO: no focal motor/sensory deficits SKIN:  no rashes or significant lesions  LABORATORY DATA:  I have reviewed the data as listed    Component Value Date/Time   NA 134 (L) 11/26/2016 1110   K 4.0 11/26/2016 1110   CL 101 11/26/2016 1110   CO2 25 11/26/2016 1110   GLUCOSE 90 11/26/2016 1110   BUN 17 11/26/2016 1110   CREATININE 0.74 11/26/2016 1110   CALCIUM 9.1 11/26/2016 1110   PROT 6.5 11/26/2016 1110   ALBUMIN 4.0 11/26/2016 1110   AST 27 11/26/2016  1110   ALT 27 11/26/2016 1110   ALKPHOS 48 11/26/2016 1110   BILITOT 0.6 11/26/2016 1110   GFRNONAA >60 11/26/2016 1110   GFRAA >60 11/26/2016 1110    No results found for: SPEP, UPEP  Lab Results  Component Value Date   WBC 4.5 11/26/2016   NEUTROABS 3.2 11/26/2016   HGB 13.0 11/26/2016   HCT 37.5 11/26/2016   MCV 96.8 11/26/2016   PLT 207 11/26/2016      Chemistry      Component Value Date/Time   NA 134 (L) 11/26/2016 1110   K 4.0 11/26/2016 1110   CL 101 11/26/2016 1110   CO2 25 11/26/2016 1110   BUN 17 11/26/2016 1110  CREATININE 0.74 11/26/2016 1110      Component Value Date/Time   CALCIUM 9.1 11/26/2016 1110   ALKPHOS 48 11/26/2016 1110   AST 27 11/26/2016 1110   ALT 27 11/26/2016 1110   BILITOT 0.6 11/26/2016 1110    IMPRESSION: 1. Today's study demonstrates progression of metastatic disease with increased number and size of numerous pulmonary metastases, as well as increasingly prominent soft tissue extending from the right brachial plexus into the right axillary region, as detailed above. No new metastatic disease in the abdomen or pelvis. 2.  Aortic atherosclerosis. 3. Additional incidental findings, as above. Aortic Atherosclerosis (ICD10-I70.0).   Electronically Signed   By: Vinnie Langton M.D.   On: 10/08/2016 15:06 ---------------------------------------------------------------------------------------------- Increasing infiltrative soft tissue along RIGHT brachials plexus representing lymphadenopathy and/or perineural tumor. Apparent worsening may reflect post treatment related changes and lymphedema though tumor progression is possible.   Electronically Signed   By: Elon Alas M.D.   On: 10/08/2016 14:37  RADIOGRAPHIC STUDIES: I have personally reviewed the radiological images as listed and agreed with the findings in the report. No results found.   ASSESSMENT & PLAN:  Carcinoma of lower-inner quadrant of left breast in  female, estrogen receptor positive ([place]) Metastatic ER/PR positive HER-2/neu negative breast cancer most recently on Faslodex /ibrance- March 20th 2018. Patient stopped Faslodex/ibrance-secondary to intolerance in July 2018. AUG 2018-3rd CT scan- progression of the right brachial plexus lesion; increasing lung nodules.   # DECLINES any further treatments- given that all treatments are palliative not curative; concerned about potential side effects.  # Worsening pain right shoulder- secondary involvement of brachial plexus; status post prior radiation. Mobic not helped. Discussed her trial of Neurontin. Patient reluctant. Given a prescription she'll fill it if she is interested.  # She wants to follow up in 3 months/patient preference; we will get labs. Symptomatically call us sooner.   CC:Dr. Morayati.    Orders Placed This Encounter  Procedures  . CBC with Differential/Platelet    Standing Status:   Future    Standing Expiration Date:   11/26/2017  . Comprehensive metabolic panel    Standing Status:   Future    Standing Expiration Date:   11/26/2017  . Cancer antigen 27.29    Standing Status:   Future    Standing Expiration Date:   11/26/2017      Cammie Sickle, MD 11/26/2016 1:26 PM

## 2016-11-26 NOTE — Assessment & Plan Note (Addendum)
Metastatic ER/PR positive HER-2/neu negative breast cancer most recently on Faslodex /ibrance- March 20th 2018. Patient stopped Faslodex/ibrance-secondary to intolerance in July 2018. AUG 2018-3rd CT scan- progression of the right brachial plexus lesion; increasing lung nodules.   # DECLINES any further treatments- given that all treatments are palliative not curative; concerned about potential side effects.  # Worsening pain right shoulder- secondary involvement of brachial plexus; status post prior radiation. Mobic not helped. Discussed her trial of Neurontin. Patient reluctant. Given a prescription she'll fill it if she is interested.  # She wants to follow up in 3 months/patient preference; we will get labs. Symptomatically call us sooner.   CC:Dr. Morayati.

## 2016-11-27 LAB — CANCER ANTIGEN 27.29: CAN 27.29: 83.6 U/mL — AB (ref 0.0–38.6)

## 2017-01-30 ENCOUNTER — Telehealth: Payer: Self-pay | Admitting: *Deleted

## 2017-01-30 NOTE — Telephone Encounter (Signed)
Per Vickki Muff, patient to see NP in symptoms management clinic. Patient accepted appointment for tomorrow at 930

## 2017-01-30 NOTE — Telephone Encounter (Signed)
Patient called asking that her 12/28 appointment be moved up as she is having swelling form lymphedema in her right arm. Do you want me to schedule her for symptom management or do you wan to see her some time in the next couple of weeks. Please advise

## 2017-01-31 ENCOUNTER — Inpatient Hospital Stay: Payer: Medicare HMO | Attending: Oncology | Admitting: Oncology

## 2017-01-31 VITALS — BP 162/75 | HR 103 | Temp 98.1°F | Resp 16 | Wt 117.4 lb

## 2017-01-31 DIAGNOSIS — I10 Essential (primary) hypertension: Secondary | ICD-10-CM

## 2017-01-31 DIAGNOSIS — Z9011 Acquired absence of right breast and nipple: Secondary | ICD-10-CM

## 2017-01-31 DIAGNOSIS — Z923 Personal history of irradiation: Secondary | ICD-10-CM | POA: Diagnosis not present

## 2017-01-31 DIAGNOSIS — R221 Localized swelling, mass and lump, neck: Secondary | ICD-10-CM

## 2017-01-31 DIAGNOSIS — M25511 Pain in right shoulder: Secondary | ICD-10-CM

## 2017-01-31 DIAGNOSIS — M549 Dorsalgia, unspecified: Secondary | ICD-10-CM | POA: Diagnosis not present

## 2017-01-31 DIAGNOSIS — R918 Other nonspecific abnormal finding of lung field: Secondary | ICD-10-CM | POA: Diagnosis not present

## 2017-01-31 DIAGNOSIS — R059 Cough, unspecified: Secondary | ICD-10-CM

## 2017-01-31 DIAGNOSIS — I890 Lymphedema, not elsewhere classified: Secondary | ICD-10-CM | POA: Diagnosis not present

## 2017-01-31 DIAGNOSIS — R000 Tachycardia, unspecified: Secondary | ICD-10-CM | POA: Insufficient documentation

## 2017-01-31 DIAGNOSIS — Z170 Estrogen receptor positive status [ER+]: Secondary | ICD-10-CM | POA: Diagnosis not present

## 2017-01-31 DIAGNOSIS — C50312 Malignant neoplasm of lower-inner quadrant of left female breast: Secondary | ICD-10-CM | POA: Insufficient documentation

## 2017-01-31 DIAGNOSIS — R05 Cough: Secondary | ICD-10-CM | POA: Insufficient documentation

## 2017-01-31 DIAGNOSIS — Z79899 Other long term (current) drug therapy: Secondary | ICD-10-CM | POA: Diagnosis not present

## 2017-01-31 DIAGNOSIS — C50811 Malignant neoplasm of overlapping sites of right female breast: Secondary | ICD-10-CM

## 2017-01-31 DIAGNOSIS — R0981 Nasal congestion: Secondary | ICD-10-CM | POA: Insufficient documentation

## 2017-01-31 DIAGNOSIS — R590 Localized enlarged lymph nodes: Secondary | ICD-10-CM | POA: Insufficient documentation

## 2017-01-31 DIAGNOSIS — Z853 Personal history of malignant neoplasm of breast: Secondary | ICD-10-CM | POA: Insufficient documentation

## 2017-01-31 MED ORDER — LIDOCAINE 5 % EX PTCH: 1.0000 | MEDICATED_PATCH | CUTANEOUS | 0 refills | Status: DC

## 2017-01-31 NOTE — Progress Notes (Signed)
Symptom Management Consult note [facility]  [phone] [phone]  Patient Care Team: Lenard Simmer, MD as PCP - General (Endocrinology)   Name of the patient: Hayley [name]  [id number]  [birth year]   Date of visit: 02/01/17  Diagnosis- Carcinoma of lower-inner quadrant of left breast in female, estrogen receptor positive   Chief complaint/ Reason for visit- Right arm swelling/lympedema  Heme/Onc history: # June 2015-RIGHT BREAST CA ? Recurrent- IMC [nipple inverted; T4-skin invol; s/p Mastec; No LN; Dr.Smith ]; ER/PR- Pos 51-90%;Her 2 neu-NEG;Declined RT/Anti-hormone;   #  STAGE IV- Nov 2016- IMC ER-90%; PR-51-90%; her 2 neu-NEG [right ax LN Bx] NOV 2016- IBRANCE + Letrozole; April 2017- PET- RLL/LLL- progression; Supraclav/Ax LN- STABLE/TM- rising;June 20th 2017 FASLODEX+ Ibrance 159m; OCT 2nd- CT C/A/P- STABLE pul mets.   # NOV CT Neck- Progression in brachial plexus area-  RT [s/p RT- middec 2017]  # AUG 2018- PROGRESSION of right brach plexus/ bil lung nodules; STOP Faslodex-Ibrance  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 [[facility] s/p lumpec;s/p Tam x9 M RT]  # 2015- R UE Lymphedema on Sleeve  # Jan 2018- MMR-STABLE.   Interval history- Patient was last seen by Dr. BRogue Bussingon 11/26/2016 for follow-up and results from recent CT scans. Unfortunately CT scan showed progression and she declined further treatments at this time. She continued to have moderate to severe pain in the right shoulder that radiated to right arm. She is status post radiation to brachial plexus. She was taking Tylenol 500 mg twice a day without minimal relief. She was given a trial of Neurontin but she was reluctant to fill the prescription. She denied nausea or vomiting. She also denied shortness of breath or chest pain.   Patient presents today for swelling of right arm progressive for 3 months and a cough X 3 days.   Edema: Patient complains of lymphedema. The  location of the edema is right arm.  The edema has been severe.  Onset of symptoms was 2 months ago, progressive since that time. The edema is present all day. The patient describes it as swelling, tightness, painful in hand and arm.  The swelling is aggravated with activty , relieved by rest and elevation, and been associated with right breast cancer- lymp node dissection. Patient notes that approximately one year ago she received radiation to her right brachial  plexus and has noted progressive worsening lymphedema since but the past three months she has noticed a significant difference. She has been seen in a lymphedema clinic in the past but it has been several years. She currently wears a compression sleeve and uses her compression pump at night. She takes tylenol for the pain.   Cough: Patient complains of nasal congestion and productive cough with sputum described as yellow.  Symptoms began 3 days ago.  The cough is non-productive, without wheezing, dyspnea or hemoptysis, waxing and waning over time and is aggravated by nothing. Its constant. Associated symptoms include:sputum production. She denies fever or flulike illness. Her cough keeps her up at night. She has tried over-the-counter cough drops with minimal relief. She tries to drink plenty of fluids. She states she thinks this may be bronchitis.   ECOG FS:1 - Symptomatic but completely ambulatory  Review of systems- Review of Systems  Constitutional: Negative for chills, fever and weight loss.  HENT: Negative.   Eyes: Negative.   Respiratory: Positive for cough and sputum production. Negative for shortness of breath and wheezing.  Cardiovascular: Negative.   Gastrointestinal: Negative.  Negative for abdominal pain, constipation and diarrhea.  Genitourinary: Negative.   Musculoskeletal: Positive for back pain and myalgias.  Skin: Negative.   Neurological: Negative.   Endo/Heme/Allergies: Negative.   Psychiatric/Behavioral: Negative.        Current treatment- No current treatment at this time.   Allergies  Allergen Reactions  . Tetracyclines & Related      Past Medical History:  Diagnosis Date  . Arthritis   . Breast cancer ([place]) 17+ years ago   Right Breast c radiation -- 2015 Mastectomy  . Cancer of breast ([place]) 01/20/2015  . HOH (hard of hearing)    AIDS  . Hypertension   . Malignant neoplasm of lower-inner quadrant of left breast in female, estrogen receptor negative [facility])      Past Surgical History:  Procedure Laterality Date  . ABDOMINAL HYSTERECTOMY    . BREAST LUMPECTOMY Right 1998   c Radiation  . CATARACT EXTRACTION W/PHACO Left 08/08/2015   Procedure: CATARACT EXTRACTION PHACO AND INTRAOCULAR LENS PLACEMENT (IOC);  Surgeon: Leandrew Koyanagi, MD;  Location: [facility] ORS;  Service: Ophthalmology;  Laterality: Left;  [place] 58.0 AP% 14.9 CDE 8.60 Fluid Pack lot # [id number]  . CATARACT EXTRACTION W/PHACO Right 12/28/2015   Procedure: CATARACT EXTRACTION PHACO AND INTRAOCULAR LENS PLACEMENT (IOC);  Surgeon: Leandrew Koyanagi, MD;  Location: [facility];  Service: Ophthalmology;  Laterality: Right;  requests early  . JOINT REPLACEMENT Right 2008   knee  . MASTECTOMY Right 2015   After screening in May 2015 - Pt choice    Social History   Socioeconomic History  . Marital status: Widowed    Spouse name: Not on file  . Number of children: Not on file  . Years of education: Not on file  . Highest education level: Not on file  Social Needs  . Financial resource strain: Not on file  . Food insecurity - worry: Not on file  . Food insecurity - inability: Not on file  . Transportation needs - medical: Not on file  . Transportation needs - non-medical: Not on file  Occupational History  . Not on file  Tobacco Use  . Smoking status: Never Smoker  . Smokeless tobacco: Never Used  Substance and Sexual Activity  . Alcohol use: No    Alcohol/week: 0.0 oz  . Drug use: No  . Sexual activity:  Not on file  Other Topics Concern  . Not on file  Social History Narrative  . Not on file    No family history on file.   Current Outpatient Medications:  .  ALPHA LIPOIC ACID PO, Take 1 capsule by mouth daily. , Disp: , Rfl:  .  calcium carbonate (TUMS - DOSED IN MG ELEMENTAL CALCIUM) 500 MG chewable tablet, Chew 1 tablet by mouth daily. , Disp: , Rfl:  .  Cholecalciferol (VITAMIN D3 PO), Take 4,000 Units by mouth 2 (two) times daily., Disp: , Rfl:  .  Coenzyme Q10 (CO Q 10 PO), Take by mouth daily., Disp: , Rfl:  .  Glucosamine-Chondroitin (GLUCOSAMINE CHONDR COMPLEX PO), Take 1 tablet by mouth 2 (two) times daily. , Disp: , Rfl:  .  lisinopril (PRINIVIL,ZESTRIL) 5 MG tablet, Take 5 mg by mouth daily. , Disp: , Rfl:  .  Melatonin 3 MG TABS, Take 3 mg by mouth as needed., Disp: , Rfl:  .  RESVERATROL PO, Take 1 tablet by mouth daily. , Disp: , Rfl:  .  lidocaine (  LIDODERM) 5 %, Place 1 patch onto the skin daily. Remove & Discard patch within 12 hours or as directed by MD, Disp: 35 patch, Rfl: 0  Physical exam:  Vitals:   01/31/17 0959  BP: (!) 162/75  Pulse: (!) 103  Resp: 16  Temp: 98.1 F (36.7 C)  TempSrc: Tympanic  SpO2: 99%  Weight: 117 lb 6.4 oz (53.3 kg)   Physical Exam  Constitutional: She is oriented to person, place, and time and well-developed, well-nourished, and in no distress.  HENT:  Head: Normocephalic and atraumatic.  Eyes: Conjunctivae are normal. Pupils are equal, round, and reactive to light.  Neck: Normal range of motion. Neck supple.  Cardiovascular: Regular rhythm, normal heart sounds and intact distal pulses. Tachycardia present.  Pulmonary/Chest: Effort normal and breath sounds normal.  Abdominal: Soft. Normal appearance, normal aorta and bowel sounds are normal.  Musculoskeletal: She exhibits edema and tenderness.       Right shoulder: She exhibits decreased range of motion, bony tenderness and swelling.       Right elbow: She exhibits swelling.  Tenderness found.       Right wrist: She exhibits decreased range of motion, tenderness and swelling.  Lymphadenopathy:    She has cervical adenopathy.       Right cervical: Deep cervical adenopathy present.  Neurological: She is alert and oriented to person, place, and time.  Skin: Skin is warm, dry and intact.  Psychiatric: Mood, memory, affect and judgment normal.     CMP Latest Ref Rng & Units 11/26/2016  Glucose 65 - 99 mg/dL 90  BUN 6 - 20 mg/dL 17  Creatinine 0.44 - 1.00 mg/dL 0.74  Sodium 135 - 145 mmol/L 134(L)  Potassium 3.5 - 5.1 mmol/L 4.0  Chloride 101 - 111 mmol/L 101  CO2 22 - 32 mmol/L 25  Calcium 8.9 - 10.3 mg/dL 9.1  Total Protein 6.5 - 8.1 g/dL 6.5  Total Bilirubin 0.3 - 1.2 mg/dL 0.6  Alkaline Phos 38 - 126 U/L 48  AST 15 - 41 U/L 27  ALT 14 - 54 U/L 27   CBC Latest Ref Rng & Units 11/26/2016  WBC 3.6 - 11.0 K/uL 4.5  Hemoglobin 12.0 - 16.0 g/dL 13.0  Hematocrit 35.0 - 47.0 % 37.5  Platelets 150 - 440 K/uL 207    No images are attached to the encounter.  No results found.   Assessment and plan- Patient is a 83 y.o. female who presents with increased swelling in right arm and cough. On initial assessment patient appears well. She is mildly hypertensive and tachycardic which appears to be her baseline. All other vitals are stable. Right arm is visibly swollen with pitting edema. Indention from her compression sleeve is visible on right tricep. Right shoulder and back free of obvious deformity but patient notes 10 out of 10 pain with palpation. Patient has decreased range of motion and strength in right arm and shoulder. Lungs are clear bilaterally. Heart sounds normal but tachycardic. Right deep cervical adenopathy present. No other lymphadenopathy identified.   1. Cervical adenopathy: STAT CT of right neck and soft tissue.  2. Worsening right arm lymphedema/Upper right cervical back pain: STAT CT of chest. Patient has agreed for further workup with imaging  given her increase in pain, worsening lymphedema and new cervical adenopathy. After a lengthy discussion with Dr. Dietrich Pates has agreed to trial of IV Chemotherapy. Patient is scheduled for stat CT of the right neck and soft tissue as well as CT of  chest. Patient will then meet with Dr. Rogue Bussing to discuss treatment of choice for trial. 3. Cough: Lungs are clear and patient was afebrile. Recommended OTC Claritin and Delsym. Patient instructed to let us know if symptoms worsened or if she became febrile.  4. Patient needs to have chemotherapy class before IV chemotherapy next week. Will try and get this scheduled on Tuesday after her scans on Monday. Plan is to start chemotherapy on Wednesday after appointment with Dr. Rogue Bussing.    Visit Diagnosis 1. Malignant neoplasm of overlapping sites of right breast ([place])   2. Localized swelling, mass or lump of neck   3. Cough     Patient expressed understanding and was in agreement with this plan. She also understands that She can call clinic at any time with any questions, concerns, or complaints.   Greater than 50% was spent in counseling and coordination of care with this patient including but not limited to discussion of the relevant topics above (See A&P) including, but not limited to diagnosis and management of acute and chronic medical conditions.    Faythe Casa, AGNP-C [facility] [place]- [phone] Pager- [phone] 02/01/2017 4:00 PM

## 2017-02-01 ENCOUNTER — Other Ambulatory Visit: Payer: Self-pay | Admitting: Internal Medicine

## 2017-02-01 NOTE — Progress Notes (Signed)
Pt planned for chemo on 12/05. Thx

## 2017-02-01 NOTE — Progress Notes (Signed)
START ON PATHWAY REGIMEN - Breast     A cycle is every 28 days (3 weeks on and 1 week off):     Paclitaxel   **Always confirm dose/schedule in your pharmacy ordering system**    Patient Characteristics: Metastatic Chemotherapy, HER2 Negative/Unknown/Equivocal, ER Positive, First Line Therapeutic Status: Distant Metastases BRCA Mutation Status: Awaiting Test Results ER Status: Positive (+) HER2 Status: Negative (-) Would you be surprised if this patient died  in the next year<= I would be surprised if this patient died in the next year PR Status: Positive (+) Line of therapy: First Line Intent of Therapy: Non-Curative / Palliative Intent, Discussed with Patient

## 2017-02-04 ENCOUNTER — Ambulatory Visit
Admission: RE | Admit: 2017-02-04 | Discharge: 2017-02-04 | Disposition: A | Discharge status: Home / Self Care | Payer: Medicare HMO | Source: Ambulatory Visit | Attending: Oncology | Admitting: Oncology

## 2017-02-04 ENCOUNTER — Telehealth: Payer: Self-pay | Admitting: Oncology

## 2017-02-04 DIAGNOSIS — C7801 Secondary malignant neoplasm of right lung: Secondary | ICD-10-CM | POA: Insufficient documentation

## 2017-02-04 DIAGNOSIS — Z853 Personal history of malignant neoplasm of breast: Secondary | ICD-10-CM | POA: Diagnosis not present

## 2017-02-04 DIAGNOSIS — Z9011 Acquired absence of right breast and nipple: Secondary | ICD-10-CM | POA: Insufficient documentation

## 2017-02-04 DIAGNOSIS — I700 Atherosclerosis of aorta: Secondary | ICD-10-CM | POA: Diagnosis not present

## 2017-02-04 DIAGNOSIS — R221 Localized swelling, mass and lump, neck: Secondary | ICD-10-CM | POA: Diagnosis present

## 2017-02-04 LAB — POCT I-STAT CREATININE: CREATININE: 0.7 mg/dL (ref 0.44–1.00)

## 2017-02-04 MED ORDER — IOPAMIDOL (ISOVUE-300) INJECTION 61%
75.0000 mL | Freq: Once | INTRAVENOUS | Status: AC | PRN
  Administered 2017-02-04: 75 mL via INTRAVENOUS

## 2017-02-04 NOTE — Patient Instructions (Signed)
Paclitaxel injection What is this medicine? PACLITAXEL (PAK li TAX el) is a chemotherapy drug. It targets fast dividing cells, like cancer cells, and causes these cells to die. This medicine is used to treat ovarian cancer, breast cancer, and other cancers. This medicine may be used for other purposes; ask your health care provider or pharmacist if you have questions. COMMON BRAND NAME(S): Onxol, Taxol What should I tell my health care provider before I take this medicine? They need to know if you have any of these conditions: -blood disorders -irregular heartbeat -infection (especially a virus infection such as chickenpox, cold sores, or herpes) -liver disease -previous or ongoing radiation therapy -an unusual or allergic reaction to paclitaxel, alcohol, polyoxyethylated castor oil, other chemotherapy agents, other medicines, foods, dyes, or preservatives -pregnant or trying to get pregnant -breast-feeding How should I use this medicine? This drug is given as an infusion into a vein. It is administered in a hospital or clinic by a specially trained health care professional. Talk to your pediatrician regarding the use of this medicine in children. Special care may be needed. Overdosage: If you think you have taken too much of this medicine contact a poison control center or emergency room at once. NOTE: This medicine is only for you. Do not share this medicine with others. What if I miss a dose? It is important not to miss your dose. Call your doctor or health care professional if you are unable to keep an appointment. What may interact with this medicine? Do not take this medicine with any of the following medications: -disulfiram -metronidazole This medicine may also interact with the following medications: -cyclosporine -diazepam -ketoconazole -medicines to increase blood counts like filgrastim, pegfilgrastim, sargramostim -other chemotherapy drugs like cisplatin, doxorubicin,  epirubicin, etoposide, teniposide, vincristine -quinidine -testosterone -vaccines -verapamil Talk to your doctor or health care professional before taking any of these medicines: -acetaminophen -aspirin -ibuprofen -ketoprofen -naproxen This list may not describe all possible interactions. Give your health care provider a list of all the medicines, herbs, non-prescription drugs, or dietary supplements you use. Also tell them if you smoke, drink alcohol, or use illegal drugs. Some items may interact with your medicine. What should I watch for while using this medicine? Your condition will be monitored carefully while you are receiving this medicine. You will need important blood work done while you are taking this medicine. This medicine can cause serious allergic reactions. To reduce your risk you will need to take other medicine(s) before treatment with this medicine. If you experience allergic reactions like skin rash, itching or hives, swelling of the face, lips, or tongue, tell your doctor or health care professional right away. In some cases, you may be given additional medicines to help with side effects. Follow all directions for their use. This drug may make you feel generally unwell. This is not uncommon, as chemotherapy can affect healthy cells as well as cancer cells. Report any side effects. Continue your course of treatment even though you feel ill unless your doctor tells you to stop. Call your doctor or health care professional for advice if you get a fever, chills or sore throat, or other symptoms of a cold or flu. Do not treat yourself. This drug decreases your body's ability to fight infections. Try to avoid being around people who are sick. This medicine may increase your risk to bruise or bleed. Call your doctor or health care professional if you notice any unusual bleeding. Be careful brushing and flossing your teeth or   using a toothpick because you may get an infection or  bleed more easily. If you have any dental work done, tell your dentist you are receiving this medicine. Avoid taking products that contain aspirin, acetaminophen, ibuprofen, naproxen, or ketoprofen unless instructed by your doctor. These medicines may hide a fever. Do not become pregnant while taking this medicine. Women should inform their doctor if they wish to become pregnant or think they might be pregnant. There is a potential for serious side effects to an unborn child. Talk to your health care professional or pharmacist for more information. Do not breast-feed an infant while taking this medicine. Men are advised not to father a child while receiving this medicine. This product may contain alcohol. Ask your pharmacist or healthcare provider if this medicine contains alcohol. Be sure to tell all healthcare providers you are taking this medicine. Certain medicines, like metronidazole and disulfiram, can cause an unpleasant reaction when taken with alcohol. The reaction includes flushing, headache, nausea, vomiting, sweating, and increased thirst. The reaction can last from 30 minutes to several hours. What side effects may I notice from receiving this medicine? Side effects that you should report to your doctor or health care professional as soon as possible: -allergic reactions like skin rash, itching or hives, swelling of the face, lips, or tongue -low blood counts - This drug may decrease the number of white blood cells, red blood cells and platelets. You may be at increased risk for infections and bleeding. -signs of infection - fever or chills, cough, sore throat, pain or difficulty passing urine -signs of decreased platelets or bleeding - bruising, pinpoint red spots on the skin, black, tarry stools, nosebleeds -signs of decreased red blood cells - unusually weak or tired, fainting spells, lightheadedness -breathing problems -chest pain -high or low blood pressure -mouth sores -nausea and  vomiting -pain, swelling, redness or irritation at the injection site -pain, tingling, numbness in the hands or feet -slow or irregular heartbeat -swelling of the ankle, feet, hands Side effects that usually do not require medical attention (report to your doctor or health care professional if they continue or are bothersome): -bone pain -complete hair loss including hair on your head, underarms, pubic hair, eyebrows, and eyelashes -changes in the color of fingernails -diarrhea -loosening of the fingernails -loss of appetite -muscle or joint pain -red flush to skin -sweating This list may not describe all possible side effects. Call your doctor for medical advice about side effects. You may report side effects to FDA at 1-800-FDA-1088. Where should I keep my medicine? This drug is given in a hospital or clinic and will not be stored at home. NOTE: This sheet is a summary. It may not cover all possible information. If you have questions about this medicine, talk to your doctor, pharmacist, or health care provider.  2018 Elsevier/Gold Standard (2014-12-21 19:58:00)  

## 2017-02-04 NOTE — Telephone Encounter (Signed)
Ms. Gilchrest called this morning inquiring about new appointments made for this week. Patient is okay with CT scan scheduled for this morning but would like to cancel her appointments for possible new chemotherapy  treatment on Wednesday. She would like to bring her daughter with her to her appointment on Wednesday as Dr. Rogue Bussing discusses IV chemotherapy treatment options. She is unsure if she will like IV chemotherapy at this time. She also asked about labs that day and I encouraged her to keep that appointment. We will also cancel her chemotherapy class on Tuesday and wait for her to decide about treatment.   Additionally, patient mentioned that she has an appointment at the lymphedema clinic on Friday. She states her pain in her right shoulder and arm is not any better with use of the lidocaine patch. She has tried some natural herbal topical extracts such as hemp oil and "blue gel". This seems to help some. She still complains of cough and has been using Delsym as instructed from last visit. She denies any further complaints.

## 2017-02-05 ENCOUNTER — Inpatient Hospital Stay: Payer: Medicare HMO

## 2017-02-05 ENCOUNTER — Inpatient Hospital Stay: Payer: Medicare HMO | Attending: Internal Medicine

## 2017-02-05 DIAGNOSIS — I10 Essential (primary) hypertension: Secondary | ICD-10-CM | POA: Insufficient documentation

## 2017-02-05 DIAGNOSIS — C7802 Secondary malignant neoplasm of left lung: Secondary | ICD-10-CM | POA: Insufficient documentation

## 2017-02-05 DIAGNOSIS — Z170 Estrogen receptor positive status [ER+]: Secondary | ICD-10-CM | POA: Insufficient documentation

## 2017-02-05 DIAGNOSIS — Z79899 Other long term (current) drug therapy: Secondary | ICD-10-CM | POA: Insufficient documentation

## 2017-02-05 DIAGNOSIS — I700 Atherosclerosis of aorta: Secondary | ICD-10-CM | POA: Insufficient documentation

## 2017-02-05 DIAGNOSIS — Z923 Personal history of irradiation: Secondary | ICD-10-CM | POA: Insufficient documentation

## 2017-02-05 DIAGNOSIS — R221 Localized swelling, mass and lump, neck: Secondary | ICD-10-CM | POA: Insufficient documentation

## 2017-02-05 DIAGNOSIS — C50312 Malignant neoplasm of lower-inner quadrant of left female breast: Secondary | ICD-10-CM | POA: Insufficient documentation

## 2017-02-05 DIAGNOSIS — C7989 Secondary malignant neoplasm of other specified sites: Secondary | ICD-10-CM | POA: Insufficient documentation

## 2017-02-05 DIAGNOSIS — M1990 Unspecified osteoarthritis, unspecified site: Secondary | ICD-10-CM | POA: Insufficient documentation

## 2017-02-05 DIAGNOSIS — Z9011 Acquired absence of right breast and nipple: Secondary | ICD-10-CM | POA: Insufficient documentation

## 2017-02-05 DIAGNOSIS — R590 Localized enlarged lymph nodes: Secondary | ICD-10-CM | POA: Insufficient documentation

## 2017-02-05 DIAGNOSIS — Z853 Personal history of malignant neoplasm of breast: Secondary | ICD-10-CM | POA: Insufficient documentation

## 2017-02-05 DIAGNOSIS — I890 Lymphedema, not elsewhere classified: Secondary | ICD-10-CM | POA: Insufficient documentation

## 2017-02-05 DIAGNOSIS — C7801 Secondary malignant neoplasm of right lung: Secondary | ICD-10-CM | POA: Insufficient documentation

## 2017-02-05 DIAGNOSIS — M25511 Pain in right shoulder: Secondary | ICD-10-CM | POA: Insufficient documentation

## 2017-02-06 ENCOUNTER — Inpatient Hospital Stay: Payer: Medicare HMO

## 2017-02-06 ENCOUNTER — Encounter: Payer: Self-pay | Admitting: Internal Medicine

## 2017-02-06 ENCOUNTER — Inpatient Hospital Stay (HOSPITAL_BASED_OUTPATIENT_CLINIC_OR_DEPARTMENT_OTHER): Payer: Medicare HMO | Admitting: Internal Medicine

## 2017-02-06 VITALS — BP 153/73 | HR 78 | Temp 98.6°F | Resp 16 | Wt 115.6 lb

## 2017-02-06 DIAGNOSIS — C7801 Secondary malignant neoplasm of right lung: Secondary | ICD-10-CM | POA: Diagnosis not present

## 2017-02-06 DIAGNOSIS — C7802 Secondary malignant neoplasm of left lung: Secondary | ICD-10-CM

## 2017-02-06 DIAGNOSIS — Z853 Personal history of malignant neoplasm of breast: Secondary | ICD-10-CM | POA: Diagnosis not present

## 2017-02-06 DIAGNOSIS — Z170 Estrogen receptor positive status [ER+]: Secondary | ICD-10-CM

## 2017-02-06 DIAGNOSIS — C7989 Secondary malignant neoplasm of other specified sites: Secondary | ICD-10-CM

## 2017-02-06 DIAGNOSIS — C50312 Malignant neoplasm of lower-inner quadrant of left female breast: Secondary | ICD-10-CM

## 2017-02-06 DIAGNOSIS — R590 Localized enlarged lymph nodes: Secondary | ICD-10-CM | POA: Diagnosis not present

## 2017-02-06 DIAGNOSIS — Z9011 Acquired absence of right breast and nipple: Secondary | ICD-10-CM | POA: Diagnosis not present

## 2017-02-06 DIAGNOSIS — Z79899 Other long term (current) drug therapy: Secondary | ICD-10-CM | POA: Diagnosis not present

## 2017-02-06 DIAGNOSIS — R221 Localized swelling, mass and lump, neck: Secondary | ICD-10-CM | POA: Diagnosis not present

## 2017-02-06 DIAGNOSIS — M1990 Unspecified osteoarthritis, unspecified site: Secondary | ICD-10-CM | POA: Diagnosis not present

## 2017-02-06 DIAGNOSIS — M25511 Pain in right shoulder: Secondary | ICD-10-CM | POA: Diagnosis not present

## 2017-02-06 DIAGNOSIS — I890 Lymphedema, not elsewhere classified: Secondary | ICD-10-CM | POA: Diagnosis not present

## 2017-02-06 DIAGNOSIS — Z923 Personal history of irradiation: Secondary | ICD-10-CM | POA: Diagnosis not present

## 2017-02-06 DIAGNOSIS — I10 Essential (primary) hypertension: Secondary | ICD-10-CM | POA: Diagnosis not present

## 2017-02-06 DIAGNOSIS — I700 Atherosclerosis of aorta: Secondary | ICD-10-CM

## 2017-02-06 LAB — CBC WITH DIFFERENTIAL/PLATELET
BASOS ABS: 0 10*3/uL (ref 0–0.1)
BASOS PCT: 1 %
EOS PCT: 2 %
Eosinophils Absolute: 0.1 10*3/uL (ref 0–0.7)
HCT: 39.6 % (ref 35.0–47.0)
Hemoglobin: 13.5 g/dL (ref 12.0–16.0)
Lymphocytes Relative: 12 %
Lymphs Abs: 0.8 10*3/uL — ABNORMAL LOW (ref 1.0–3.6)
MCH: 31.5 pg (ref 26.0–34.0)
MCHC: 34.2 g/dL (ref 32.0–36.0)
MCV: 92.1 fL (ref 80.0–100.0)
MONO ABS: 0.7 10*3/uL (ref 0.2–0.9)
MONOS PCT: 11 %
Neutro Abs: 5.2 10*3/uL (ref 1.4–6.5)
Neutrophils Relative %: 74 %
PLATELETS: 214 10*3/uL (ref 150–440)
RBC: 4.29 MIL/uL (ref 3.80–5.20)
RDW: 14.8 % — AB (ref 11.5–14.5)
WBC: 6.9 10*3/uL (ref 3.6–11.0)

## 2017-02-06 LAB — COMPREHENSIVE METABOLIC PANEL
ALBUMIN: 3.9 g/dL (ref 3.5–5.0)
ALK PHOS: 49 U/L (ref 38–126)
ALT: 22 U/L (ref 14–54)
AST: 25 U/L (ref 15–41)
Anion gap: 8 (ref 5–15)
BILIRUBIN TOTAL: 0.7 mg/dL (ref 0.3–1.2)
BUN: 15 mg/dL (ref 6–20)
CALCIUM: 9 mg/dL (ref 8.9–10.3)
CO2: 26 mmol/L (ref 22–32)
Chloride: 98 mmol/L — ABNORMAL LOW (ref 101–111)
Creatinine, Ser: 0.65 mg/dL (ref 0.44–1.00)
GFR calc Af Amer: >60 mL/min (ref >60)
GFR calc non Af Amer: >60 mL/min (ref >60)
GLUCOSE: 95 mg/dL (ref 65–99)
Potassium: 4.3 mmol/L (ref 3.5–5.1)
Sodium: 132 mmol/L — ABNORMAL LOW (ref 135–145)
TOTAL PROTEIN: 6.6 g/dL (ref 6.5–8.1)

## 2017-02-06 MED ORDER — FENTANYL 12 MCG/HR TD PT72: 12.5000 ug | MEDICATED_PATCH | TRANSDERMAL | 0 refills | Status: DC

## 2017-02-06 MED ORDER — HYDROCODONE-ACETAMINOPHEN 5-325 MG PO TABS: 1.0000 | ORAL_TABLET | Freq: Four times a day (QID) | ORAL | 0 refills | Status: DC | PRN

## 2017-02-06 NOTE — Assessment & Plan Note (Addendum)
Metastatic ER/PR positive HER-2/neu negative breast cancer most recently on Faslodex /ibrance- March 20th 2018. Patient stopped Faslodex/ibrance-secondary to intolerance in July 2018. DEC 4th CT scan- progression of the right brachial plexus lesion/neck lesion; increasing lung nodules.   # DECLINES any further treatments- given that all treatments are palliative not curative; concerned about potential side effects.  I have had multiple discussions with the patient-feels as long treatments are not curative she is not going to consider any further therapy.  She understands life expectancy is likely in the order of a few months-given the progression of disease noted on the CT scans.  # Worsening pain right shoulder- secondary involvement of brachial plexus; status post prior radiation.  Discussed use of narcotic medications-patient reluctant to use-given the contraindication to driving.  However currently NSAIDs are not helping.  Discussed regarding fentanyl patch 12.5 mcg every 72 hours; also hydrocodone 1 every 6 hours as needed.  Discussed potential side effects of constipation drowsiness and dizziness; hallucinations etc.   #Right upper extremity lymphedema-likely secondary progressive malignancy.  I do not think lymphedema physical therapy is going to have the patient significant.  However I have asked the patient to keep her appointment with physical therapist; follow recommendations.  #Long discussion about hospice/palliative care.  Patient's husband had been in hospice before.  Recommend evaluation with palliative care./Patient-daughter agreeable.  Follow-up only as needed.  # I reviewed the blood work- with the patient in detail; also reviewed the imaging independently [as summarized above]; and with the patient in detail.   # 40 minutes face-to-face with the patient discussing the above plan of care; more than 50% of time spent on prognosis/ natural history; counseling and  coordination.    CC:Dr. Morayati.

## 2017-02-06 NOTE — Progress Notes (Signed)
[facility] OFFICE PROGRESS NOTE  Patient Care Team: Morayati, Shamil J, MD as PCP - General (Endocrinology)  Cancer of breast (HCC)   Staging form: Breast, AJCC 7th Edition     Clinical: Stage IV (T4, N1, M1) - Signed by Janak Choksi, MD on 01/20/2015    Oncology History   # June 2015-RIGHT BREAST CA ? Recurrent- IMC [nipple inverted; T4-skin invol; s/p Mastec; No LN; Dr.Smith ]; ER/PR- Pos 51-90%;Her 2 neu-NEG;Declined RT/Anti-hormone;   #  STAGE IV- Nov 2016- IMC ER-90%; PR-51-90%; her 2 neu-NEG [right ax LN Bx] NOV 2016- IBRANCE + Letrozole; April 2017- PET- RLL/LLL- progression; Supraclav/Ax LN- STABLE/TM- rising;June 20th 2017 FASLODEX+ Ibrance 100mg; OCT 2nd- CT C/A/P- STABLE pul mets.   # NOV CT Neck- Progression in brachial plexus area-  RT [s/p RT- middec 2017]  # AUG 2018- PROGRESSION of right brach plexus/ bil lung nodules; STOP Faslodex-Ibrance  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 [[facility] s/p lumpec;s/p Tam x9 M RT]  # 2015- R UE Lymphedema on Sleeve  # Jan 2018- MMR-STABLE.      Carcinoma of lower-inner quadrant of left breast in female, estrogen receptor positive (HCC)    INTERVAL HISTORY:  Hayley [name] 83 y.o.  female pleasant patient above history of Metastatic ER/PR positive HER-2/neu negative breast cancer-currently on surveillance/given patient's preference-is here to review the results of her CAT scans.  Patient was recently evaluated in the symptom management clinic-given her worsening right shoulder pain; cough.  Shortness of breath.  She has limited mobility of the right upper extremity because of the pain.  Patient has been taking Tylenol as needed for pain.  She states the pain is significant enough to interrupt her sleep at night. She denies headaches.    REVIEW OF SYSTEMS:  A complete 10 point review of system is done which is negative except mentioned above/history of present illness.   PAST MEDICAL HISTORY :  Past Medical History:  Diagnosis  Date  . Arthritis   . Breast cancer (HCC) 17+ years ago   Right Breast c radiation -- 2015 Mastectomy  . Cancer of breast (HCC) 01/20/2015  . HOH (hard of hearing)    AIDS  . Hypertension   . Malignant neoplasm of lower-inner quadrant of left breast in female, estrogen receptor negative (HCC)     PAST SURGICAL HISTORY :   Past Surgical History:  Procedure Laterality Date  . ABDOMINAL HYSTERECTOMY    . BREAST LUMPECTOMY Right 1998   c Radiation  . CATARACT EXTRACTION W/PHACO Left 08/08/2015   Procedure: CATARACT EXTRACTION PHACO AND INTRAOCULAR LENS PLACEMENT (IOC);  Surgeon: Chadwick Brasington, MD;  Location: [facility];  Service: Ophthalmology;  Laterality: Left;  US 58.0 AP% 14.9 CDE 8.60 Fluid Pack lot # [id number]  . CATARACT EXTRACTION W/PHACO Right 12/28/2015   Procedure: CATARACT EXTRACTION PHACO AND INTRAOCULAR LENS PLACEMENT (IOC);  Surgeon: Chadwick Brasington, MD;  Location: [facility];  Service: Ophthalmology;  Laterality: Right;  requests early  . JOINT REPLACEMENT Right 2008   knee  . MASTECTOMY Right 2015   After screening in May 2015 - Pt choice    FAMILY HISTORY :  History reviewed. No pertinent family history.  SOCIAL HISTORY:   Social History   Tobacco Use  . Smoking status: Never Smoker  . Smokeless tobacco: Never Used  Substance Use Topics  . Alcohol use: No    Alcohol/week: 0.0 oz  . Drug use: No    ALLERGIES:  is   allergic to tetracyclines & related.  MEDICATIONS:  Current Outpatient Medications  Medication Sig Dispense Refill  . ALPHA LIPOIC ACID PO Take 1 capsule by mouth daily.     . calcium carbonate (TUMS - DOSED IN MG ELEMENTAL CALCIUM) 500 MG chewable tablet Chew 1 tablet by mouth daily.     . Cholecalciferol (VITAMIN D3 PO) Take 4,000 Units by mouth 2 (two) times daily.    . Coenzyme Q10 (CO Q 10 PO) Take by mouth daily.    . Glucosamine-Chondroitin (GLUCOSAMINE CHONDR COMPLEX PO) Take 1 tablet by mouth 2 (two) times daily.      Marland Kitchen lidocaine (LIDODERM) 5 % Place 1 patch onto the skin daily. Remove & Discard patch within 12 hours or as directed by MD 35 patch 0  . lisinopril (PRINIVIL,ZESTRIL) 5 MG tablet Take 5 mg by mouth daily.     . Melatonin 3 MG TABS Take 3 mg by mouth as needed.    Marland Kitchen RESVERATROL PO Take 1 tablet by mouth daily.     . fentaNYL (DURAGESIC - DOSED MCG/HR) 12 MCG/HR Place 1 patch (12.5 mcg total) onto the skin every 3 (three) days. 10 patch 0  . HYDROcodone-acetaminophen (NORCO/VICODIN) 5-325 MG tablet Take 1 tablet by mouth every 6 (six) hours as needed for moderate pain. 45 tablet 0   No current facility-administered medications for this visit.     PHYSICAL EXAMINATION: ECOG PERFORMANCE STATUS: 0 - Asymptomatic  BP (!) 153/73 (BP Location: Left Arm, Patient Position: Sitting)   Pulse 78   Temp 98.6 F (37 C) (Tympanic)   Resp 16   Wt 115 lb 9.6 oz (52.4 kg)   BMI 19.24 kg/m   Filed Weights   02/06/17 0917  Weight: 115 lb 9.6 oz (52.4 kg)    GENERAL: Well-nourished well-developed; Alert, no distress and comfortable. Accompanied by daughter. EYES: no pallor or icterus OROPHARYNX: no thrush or ulceration; good dentition  NECK: supple, right supraclavicular adenopathy felt.  LYMPH:  no palpable lymphadenopathy in the cervical, axillary or inguinal regions LUNGS: clear to auscultation and  No wheeze or crackles HEART/CVS: regular rate & rhythm and no murmurs; No lower extremity edema ABDOMEN:abdomen soft, non-tender and normal bowel sounds Musculoskeletal:no cyanosis of digits and no clubbing; limited range of motion of the right shoulder. PSYCH: alert & oriented x 3 with fluent speech NEURO: no focal motor/sensory deficits SKIN:  no rashes or significant lesions  LABORATORY DATA:  I have reviewed the data as listed    Component Value Date/Time   NA 132 (L) 02/06/2017 0859   K 4.3 02/06/2017 0859   CL 98 (L) 02/06/2017 0859   CO2 26 02/06/2017 0859   GLUCOSE 95 02/06/2017 0859    BUN 15 02/06/2017 0859   CREATININE 0.65 02/06/2017 0859   CALCIUM 9.0 02/06/2017 0859   PROT 6.6 02/06/2017 0859   ALBUMIN 3.9 02/06/2017 0859   AST 25 02/06/2017 0859   ALT 22 02/06/2017 0859   ALKPHOS 49 02/06/2017 0859   BILITOT 0.7 02/06/2017 0859   GFRNONAA >60 02/06/2017 0859   GFRAA >60 02/06/2017 0859    No results found for: SPEP, UPEP  Lab Results  Component Value Date   WBC 6.9 02/06/2017   NEUTROABS 5.2 02/06/2017   HGB 13.5 02/06/2017   HCT 39.6 02/06/2017   MCV 92.1 02/06/2017   PLT 214 02/06/2017      Chemistry      Component Value Date/Time   NA 132 (L) 02/06/2017 5809  K 4.3 02/06/2017 0859   CL 98 (L) 02/06/2017 0859   CO2 26 02/06/2017 0859   BUN 15 02/06/2017 0859   CREATININE 0.65 02/06/2017 0859      Component Value Date/Time   CALCIUM 9.0 02/06/2017 0859   ALKPHOS 49 02/06/2017 0859   AST 25 02/06/2017 0859   ALT 22 02/06/2017 0859   BILITOT 0.7 02/06/2017 0859     IMPRESSION: 1. Disease progression. Enlarging 16 x 22 mm RIGHT intraparotid nodal conglomeration and, enlarging RIGHT supraclavicular nodal disease/metastasis. RIGHT supraclavicular to axillary fat stranding seen with pericapsular spread of disease or lymphedema. 2. Worsening infiltrative irregular soft tissue along RIGHT scalene muscle/brachial plexus most compatible with perineural spread of disease. 3. Enlarging biapical pulmonary new nodules. Please cc dedicated CT of chest from same day, reported separately.   Electronically Signed   By: Courtnay  Bloomer M.D.    On: 02/04/2017 13:52  IMPRESSION: Severely motion degraded images.  Progression of bilateral pulmonary metastases, measuring up to 3.0 cm in the bilateral lower lobes.  2.4 cm spiculated enhancing lesion in the right supraclavicular region, corresponding to the patient's known right neck mass, more conspicuous than on the prior.  9 mm short axis left axillary node, new.  Status post  right mastectomy.  Aortic Atherosclerosis (ICD10-I70.0).   Electronically Signed   By: Sriyesh  Krishnan M.D.   On: 02/04/2017 14:00  RADIOGRAPHIC STUDIES: I have personally reviewed the radiological images as listed and agreed with the findings in the report. No results found.   ASSESSMENT & PLAN:  Carcinoma of lower-inner quadrant of left breast in female, estrogen receptor positive (HCC) Metastatic ER/PR positive HER-2/neu negative breast cancer most recently on Faslodex /ibrance- March 20th 2018. Patient stopped Faslodex/ibrance-secondary to intolerance in July 2018. DEC 4th CT scan- progression of the right brachial plexus lesion/neck lesion; increasing lung nodules.   # DECLINES any further treatments- given that all treatments are palliative not curative; concerned about potential side effects.  I have had multiple discussions with the patient-feels as long treatments are not curative she is not going to consider any further therapy.  She understands life expectancy is likely in the order of a few months-given the progression of disease noted on the CT scans.  # Worsening pain right shoulder- secondary involvement of brachial plexus; status post prior radiation.  Discussed use of narcotic medications-patient reluctant to use-given the contraindication to driving.  However currently NSAIDs are not helping.  Discussed regarding fentanyl patch 12.5 mcg every 72 hours; also hydrocodone 1 every 6 hours as needed.  Discussed potential side effects of constipation drowsiness and dizziness; hallucinations etc.   #Right upper extremity lymphedema-likely secondary progressive malignancy.  I do not think lymphedema physical therapy is going to have the patient significant.  However I have asked the patient to keep her appointment with physical therapist; follow recommendations.  #Long discussion about hospice/palliative care.  Patient's husband had been in hospice before.  Recommend evaluation  with palliative care./Patient-daughter agreeable.  Follow-up only as needed.  # I reviewed the blood work- with the patient in detail; also reviewed the imaging independently [as summarized above]; and with the patient in detail.   # 40 minutes face-to-face with the patient discussing the above plan of care; more than 50% of time spent on prognosis/ natural history; counseling and coordination.    CC:Dr. Morayati.    No orders of the defined types were placed in this encounter.     Govinda R Brahmanday, MD 02/06/2017 4:43   PM

## 2017-02-07 LAB — CANCER ANTIGEN 15-3: CAN 15 3: 76.3 U/mL — AB (ref 0.0–25.0)

## 2017-02-07 LAB — CANCER ANTIGEN 27.29: CAN 27.29: 100.9 U/mL — AB (ref 0.0–38.6)

## 2017-02-08 ENCOUNTER — Encounter: Payer: Self-pay | Admitting: Occupational Therapy

## 2017-02-08 ENCOUNTER — Ambulatory Visit: Payer: Medicare HMO | Attending: Oncology | Admitting: Occupational Therapy

## 2017-02-08 DIAGNOSIS — M79621 Pain in right upper arm: Secondary | ICD-10-CM | POA: Insufficient documentation

## 2017-02-08 DIAGNOSIS — I972 Postmastectomy lymphedema syndrome: Secondary | ICD-10-CM | POA: Diagnosis present

## 2017-02-08 NOTE — Patient Instructions (Signed)
Pt to cont with daytime compression sleeve - stop pump  And for night time fitted her with Tubigrip soft from hand to upper arm - komprex foam for posterior elbow - and isotoner glove   And will check back in month  AAROM on wall in shower for R shoulder flexion and ABD

## 2017-02-08 NOTE — Therapy (Signed)
[facility] [address], [place], [address] Phone: [phone]   Fax:  [phone]  Occupational Therapy Evaluation  Patient Details  Name: Hayley [name] MRN: [id number] Date of Birth: [birth year] No Data Recorded  Encounter Date: 02/08/2017  OT End of Session - 02/08/17 [place]    Visit Number  1    Number of Visits  4    Date for OT Re-Evaluation  05/31/17    OT Start Time  1230    OT Stop Time  1329    OT Time Calculation (min)  59 min    Activity Tolerance  Patient tolerated treatment well;Patient limited by pain;Treatment limited secondary to medical complications (Comment)    Behavior During Therapy  [facility] for tasks assessed/performed       Past Medical History:  Diagnosis Date  . Arthritis   . Breast cancer ([place]) 17+ years ago   Right Breast c radiation -- 2015 Mastectomy  . Cancer of breast ([place]) 01/20/2015  . HOH (hard of hearing)    AIDS  . Hypertension   . Malignant neoplasm of lower-inner quadrant of left breast in female, estrogen receptor negative [facility])     Past Surgical History:  Procedure Laterality Date  . ABDOMINAL HYSTERECTOMY    . BREAST LUMPECTOMY Right 1998   c Radiation  . CATARACT EXTRACTION W/PHACO Left 08/08/2015   Procedure: CATARACT EXTRACTION PHACO AND INTRAOCULAR LENS PLACEMENT (IOC);  Surgeon: Leandrew Koyanagi, MD;  Location: [facility] OR[facility];  Service: Ophthalmology;  Laterality: Left;  [place] 58.0 AP% 14.9 CDE 8.60 Fluid Pack lot # [id number]  . CATARACT EXTRACTION W/PHACO Right 12/28/2015   Procedure: CATARACT EXTRACTION PHACO AND INTRAOCULAR LENS PLACEMENT (IOC);  Surgeon: Leandrew Koyanagi, MD;  Location: [place];  Service: Ophthalmology;  Laterality: Right;  requests early  . JOINT REPLACEMENT Right 2008   knee  . MASTECTOMY Right 2015   After screening in May 2015 - Pt choice    There were no vitals filed for this visit.  Subjective Assessment -  02/08/17 1811    Subjective   Pt report that she was doing okay until about last Dec after radiation that her lymphedema got worse in R arm - she still wear same compresssion sleeve during day and was doing pump - her cancer is not responding on treatment and she seen oncologist 2 day ago and she do not want further treatment but she  want to be able to use her R arm to drive her grandson from school - she has pain over her shoulderblade that she knows in cancer - that was where the radiation was , and then shw can only reach with her arm up to shoulder height then it hurts under arm -     Patient Stated Goals  I do not want my arm to get bigger , would to be able to keep the use of my arm so I can stay independent and active and pick up my grandson after school     Currently in Pain?  Yes    Pain Score  5     Pain Location  Back    Pain Orientation  Right;Posterior    Pain Descriptors / Indicators  Aching    Pain Type  -- Cancer pain     Pain Onset  More than a month ago    Pain Frequency  Constant        [facility] OT Assessment -  02/08/17 0001      Assessment   Medical Diagnosis  R UE lymphedema and upper quadrant pain     Referring Provider  Brahmandy    Onset Date/Surgical Date  01/04/15    Hand Dominance  Right    Prior Therapy  -- R lymphedema in 2016      Precautions   Precaution Comments  -- Lymphedema in [place] With  Alone      Prior Function   Vocation  Retired    Leisure  pick up grandson after school , rake leaves , cook, read       AROM   Right Shoulder Flexion  90 Degrees    Right Shoulder ABduction  95 Degrees    Left Shoulder Flexion  150 Degrees    Left Shoulder ABduction  150 Degrees       LYMPHEDEMA/ONCOLOGY QUESTIONNAIRE - 02/08/17 1258      Right Upper Extremity Lymphedema   15 cm Proximal to Olecranon Process  26 cm    10 cm Proximal to Olecranon Process  25.5 cm    Olecranon Process  25 cm    15 cm Proximal to Ulnar  Styloid Process  23.1 cm    10 cm Proximal to Ulnar Styloid Process  19.5 cm    Just Proximal to Ulnar Styloid Process  14.5 cm    Across Hand at [employer]  19 cm    At [place] of 2nd Digit  6 cm    At [employer] of Thumb  5.8 cm                    OT Education - 02/08/17 1828    Education Details  findings and homeprogram     Person(s) Educated  Patient    Methods  Explanation;Demonstration    Comprehension  Verbalized understanding;Returned demonstration       OT Short Term Goals - 02/03/15 1141      OT SHORT TERM GOAL #1   Title  R UE circumference  decrease by 3 cm at least from wrist to upper arm to get measure for compression garments     Baseline  R UE increase compare to L by 4 cm to 5.4 wrist to upper arm - hand increased 1 cm     Time  3    Period  Weeks    Status  New      OT SHORT TERM GOAL #2   Title  Pt  L cervical rotation and lateral flexion improve to the same than R side pain less than 3/10     Baseline  3/10 at the best and 7/10 at the worse over upper trap - L cervical rotation and lateral flexion decrease    Time  3    Period  Weeks    Status  New        OT Long Term Goals - 02/08/17 1841      OT LONG TERM GOAL #1   Title  Pt to be independent in homeporgram to maintain lymphedema in R UE - maintain or increase shoulder ROM without increasing pain     Baseline  see flowsheet for measurements     Time  3    Period  Months    Status  New    Target Date  05/10/17            Plan -  02/08/17 1830    Clinical Impression Statement  Pt present with diagnosis of R breast CA - had it 17 yrs ago - and then in 2015 again with radiation twice and chemo in past - met with oncologist 2 days ago - and CA in upper quadrant and brachial plexus  - decline further chemo at this time -  was seen in in Dec 16 by OT - pt refer to lymphedema clinic  because of active CA in R upper quadrant in brachial plexus - pt had radiation year ago - that she report  made her lymphedema worse and she also cannot reach higher than her shoulder height - with pain at rest at the best 5/10 and at the worse 10/10 - Pt request that she wants to drive her grandson after school  - and be able to use her arm for that , and to be independent  to take care of herself as  long as she can - recommend at this time for pt to stop pump because of the chest piece that pump lymph  acros chest  to L side - to cont same daytime compression sleeve - fitted her with tubigrip soft sleeve for nightime over isotoner glove - and provided her R shoulder flexion and ABD  AAROM HEP - will reassess her in month - pt to stop if pain increase with any of the changes     Occupational performance deficits (Please refer to evaluation for details):  ADL's;Leisure;Social Participation    Rehab Potential  Fair    OT Frequency  Monthly    OT Duration  12 weeks    OT Treatment/Interventions  Self-care/ADL training;DME and/or AE instruction;Therapeutic exercise;Passive range of motion;Patient/family education    Plan  Reassess in month of compression maintaining lymphedema and ROM in shoulder     OT Home Exercise Plan  see pt instruction     Consulted and Agree with Plan of Care  Patient       Patient will benefit from skilled therapeutic intervention in order to improve the following deficits and impairments:  Pain, Impaired flexibility, Impaired UE functional use, Decreased knowledge of precautions, Decreased range of motion  Visit Diagnosis: Postmastectomy lymphedema - Plan: Ot plan of care cert/re-cert  Pain in right upper arm - Plan: Ot plan of care cert/re-cert    Problem List Patient Active Problem List   Diagnosis Date Noted  . Carcinoma of lower-inner quadrant of left breast in female, estrogen receptor positive ([place]) 01/05/2016  . Mass of right side of neck 01/05/2016  . BP (high blood pressure) 12/21/2014    Rosalyn Gess OTR/L,CLT 02/08/2017, 6:56 PM  [facility] [address], [place], [address] Phone: [phone]   Fax:  [phone]  Name: Hayley [name] MRN: [id number] Date of Birth: Oct 15, 1933

## 2017-02-22 ENCOUNTER — Telehealth: Payer: Self-pay | Admitting: *Deleted

## 2017-02-22 NOTE — Telephone Encounter (Signed)
Palliative Care saw patient and she does not wish to persue any future treatments. Patient requesting Hospice services. If in agreement, Please send referral order to them

## 2017-02-22 NOTE — Telephone Encounter (Signed)
Referral faxed

## 2017-03-01 ENCOUNTER — Ambulatory Visit: Payer: Medicare HMO | Admitting: Internal Medicine

## 2017-03-01 ENCOUNTER — Other Ambulatory Visit: Payer: Medicare HMO

## 2017-03-09 IMAGING — CT CT CHEST W/ CM
1 of 3 series · 11 of 29 positions shown, 14 images · IV contrast (iopamidol)
Comparison: 06/27/2015

CLINICAL DATA: Right breast cancer

EXAM:
CT CHEST, ABDOMEN, AND PELVIS WITH CONTRAST
TECHNIQUE: Multidetector CT imaging of the chest, abdomen and pelvis was
performed following the standard protocol during bolus
administration of intravenous contrast.
CONTRAST:  100mL HRLD24-F77 IOPAMIDOL (HRLD24-F77) INJECTION 61%

[Series 2: cap with · axial · 0.69mm/px · z∈[-559,-64]mm · 11 of 121 slices shown, 14 images]
[im 11/121  mediastinal]
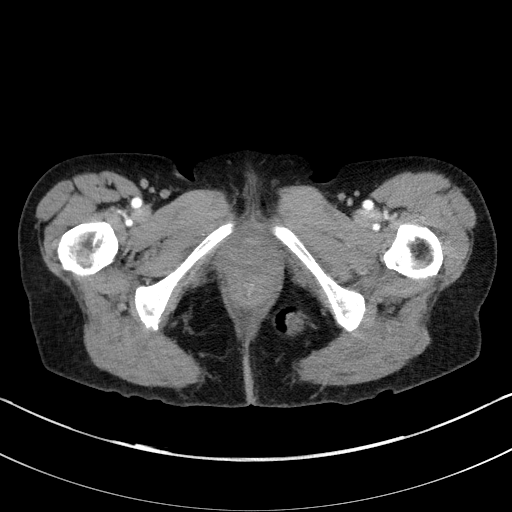
[im 11/121  lung]
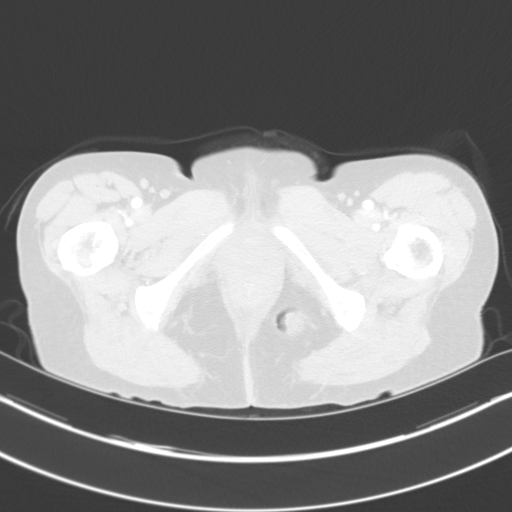
[im 22/121  lung]
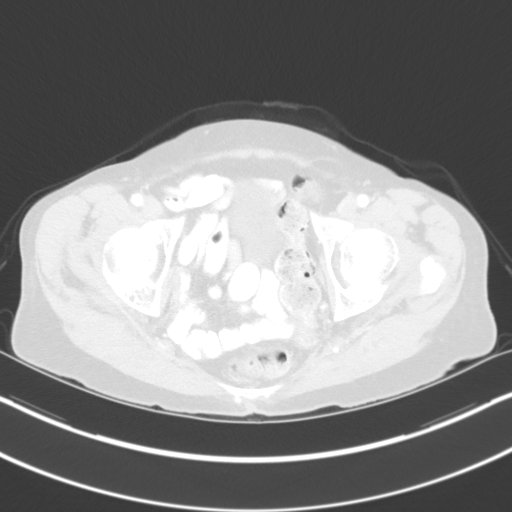
[im 33/121  lung]
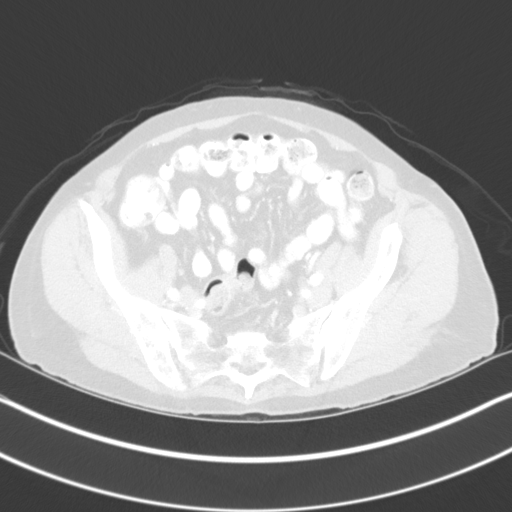
[im 44/121  lung]
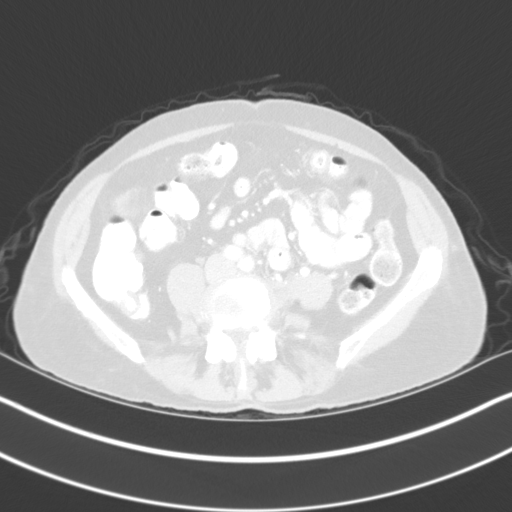
[im 55/121  mediastinal]
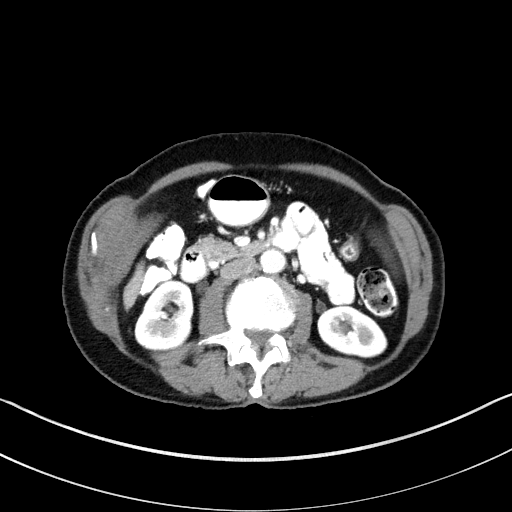
[im 55/121  lung]
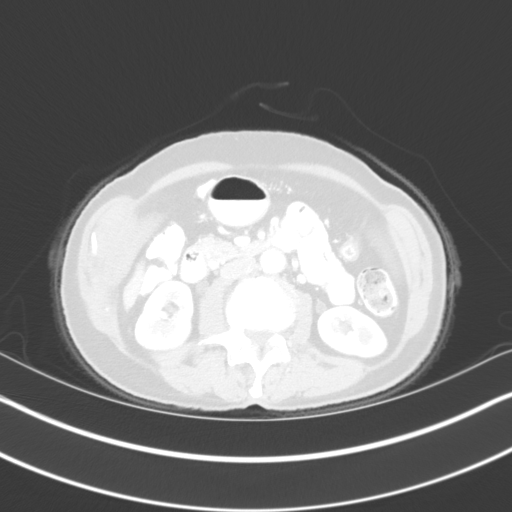
[im 60/121  lung]
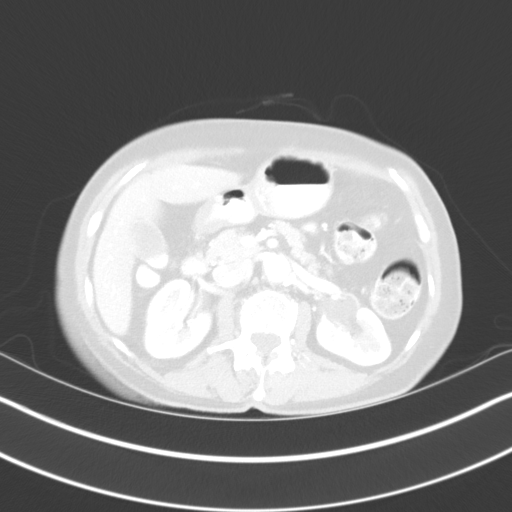
[im 66/121  lung]
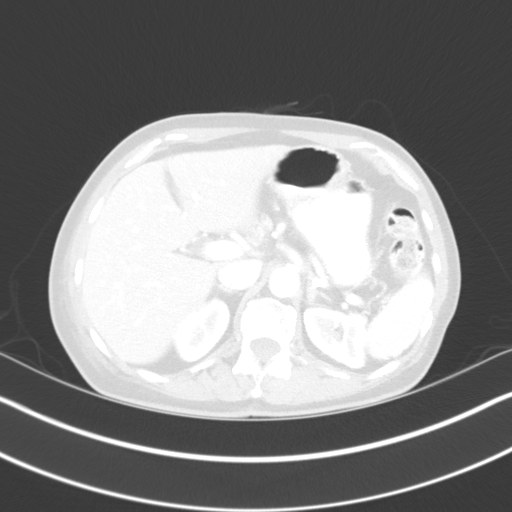
[im 77/121  lung]
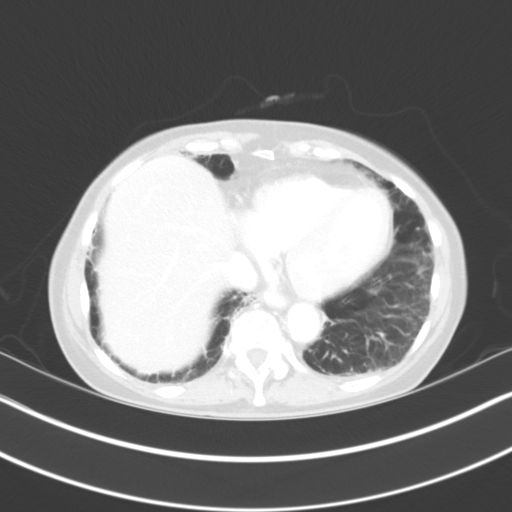
[im 88/121  mediastinal]
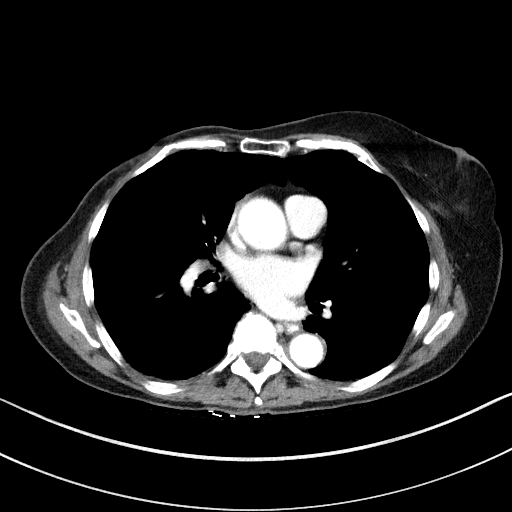
[im 88/121  lung]
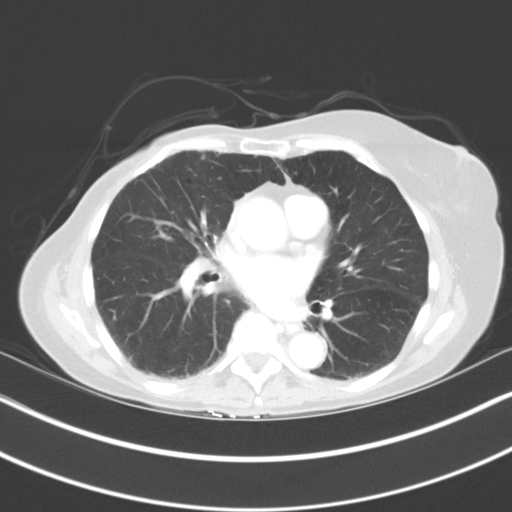
[im 99/121  lung]
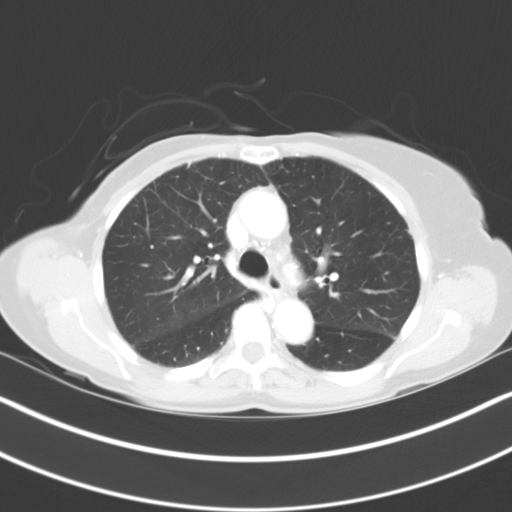
[im 110/121  lung]
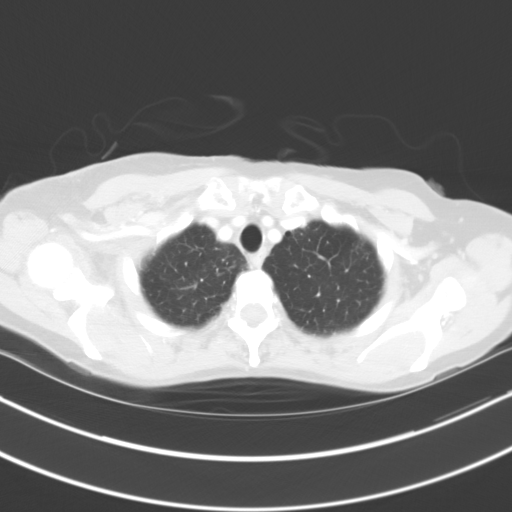

[11 of 29 positions shown; findings below may reference images not displayed]

FINDINGS: CT CHEST FINDINGS

Cardiovascular: The heart size is normal. There is no pericardial
effusion identified. Aortic atherosclerosis noted.

Mediastinum/Nodes: The trachea appears patent and is midline. Normal
appearance of the esophagus. No enlarged mediastinal or hilar
adenopathy. The index right axillary node measures 9 mm, image 15 of
series 2. Previously 1.3 cm.

Lungs/Pleura: Diffuse, basilar predominant interstitial thickening
is identified, similar to previous exam. Index nodule within the
right lower lobe is stable measuring 1.4 cm. Index nodule within the
left lower lobe measures 1.2 cm, image 118 of series 4.

Musculoskeletal: No aggressive lytic or sclerotic bone lesions.

CT ABDOMEN PELVIS FINDINGS

Hepatobiliary: No focal liver abnormality is seen. No gallstones,
gallbladder wall thickening, or biliary dilatation.

Pancreas: Unremarkable. No pancreatic ductal dilatation or
surrounding inflammatory changes.

Spleen: Normal in size without focal abnormality.

Adrenals/Urinary Tract: Adrenal glands are unremarkable. Bilateral
renal cysts identified. No mass or hydronephrosis identified. The
urinary bladder appears normal.

Stomach/Bowel: The stomach is within normal limits. The small bowel
loops have a normal course and caliber. No obstruction. Normal
appearance of the colon.

Vascular/Lymphatic: Calcified atherosclerotic disease involves the
abdominal aorta. No aneurysm. No enlarged retroperitoneal or
mesenteric adenopathy. No enlarged pelvic or inguinal lymph nodes.

Reproductive: Status post hysterectomy. No adnexal masses.

Other: There is no ascites or focal fluid collections within the
abdomen or pelvis.

Musculoskeletal: Scoliosis deformity is noted. There is mild multi
level degenerative disc disease.
IMPRESSION: 1. Stable appearance of bilateral lower lobe pulmonary metastases.
2. Decrease in size of right axillary nodal metastasis. The right
supraclavicular and prevascular index nodes are no longer
measurable.
3. Aortic atherosclerosis

## 2017-04-02 ENCOUNTER — Other Ambulatory Visit: Payer: Self-pay

## 2017-04-02 ENCOUNTER — Telehealth: Payer: Self-pay

## 2017-04-02 MED ORDER — HYDROCODONE-ACETAMINOPHEN 5-325 MG PO TABS: 1.0000 | ORAL_TABLET | Freq: Four times a day (QID) | ORAL | 0 refills | Status: DC | PRN

## 2017-04-02 NOTE — Telephone Encounter (Signed)
Manuela Schwartz with [facility] called to request refill for patient's Norco prescription.  Refill sent to [facility].

## 2017-04-03 ENCOUNTER — Other Ambulatory Visit: Payer: Self-pay | Admitting: *Deleted

## 2017-04-03 ENCOUNTER — Ambulatory Visit: Payer: Medicare HMO | Admitting: Radiation Oncology

## 2017-04-03 ENCOUNTER — Ambulatory Visit: Attending: Oncology | Admitting: Occupational Therapy

## 2017-04-03 ENCOUNTER — Telehealth: Payer: Self-pay | Admitting: Internal Medicine

## 2017-04-03 DIAGNOSIS — M62838 Other muscle spasm: Secondary | ICD-10-CM | POA: Diagnosis present

## 2017-04-03 DIAGNOSIS — I972 Postmastectomy lymphedema syndrome: Secondary | ICD-10-CM | POA: Insufficient documentation

## 2017-04-03 DIAGNOSIS — M79621 Pain in right upper arm: Secondary | ICD-10-CM | POA: Insufficient documentation

## 2017-04-03 MED ORDER — HYDROCODONE-ACETAMINOPHEN 5-325 MG PO TABS: 1.0000 | ORAL_TABLET | Freq: Four times a day (QID) | ORAL | 0 refills | Status: DC | PRN

## 2017-04-03 NOTE — Patient Instructions (Signed)
Pt to have hospice nurse phone [facility] and ask price for night time compression sleeve - Comfort  Reidsleeve with powersleeve  And new custom [employer] Elvarex soft sleeve  Her glove still okay  Can check then if [employer] can also help

## 2017-04-03 NOTE — Therapy (Deleted)
[place] [facility] [address] [place], [address] Phone: [phone]   Fax:  [phone]  Occupational Therapy Treatment  Patient Details  Name: Hayley [name] MRN: [id number] Date of Birth: 1934-01-01 Referring Provider: Burlene Arnt   Encounter Date: 04/03/2017  OT End of Session - 04/03/17 2016    Visit Number  2    Number of Visits  4    Date for OT Re-Evaluation  05/31/17    OT Start Time  1130    OT Stop Time  1238    OT Time Calculation (min)  68 min    Activity Tolerance  Patient tolerated treatment well;Patient limited by pain;Treatment limited secondary to medical complications (Comment)    Behavior During Therapy  [facility] for tasks assessed/performed       Past Medical History:  Diagnosis Date  . Arthritis   . Breast cancer ([place]) 17+ years ago   Right Breast c radiation -- 2015 Mastectomy  . Cancer of breast ([place]) 01/20/2015  . HOH (hard of hearing)    AIDS  . Hypertension   . Malignant neoplasm of lower-inner quadrant of left breast in female, estrogen receptor negative [facility])     Past Surgical History:  Procedure Laterality Date  . ABDOMINAL HYSTERECTOMY    . BREAST LUMPECTOMY Right 1998   c Radiation  . CATARACT EXTRACTION W/PHACO Left 08/08/2015   Procedure: CATARACT EXTRACTION PHACO AND INTRAOCULAR LENS PLACEMENT (IOC);  Surgeon: Leandrew Koyanagi, MD;  Location: [facility] ORS;  Service: Ophthalmology;  Laterality: Left;  [place] 58.0 AP% 14.9 CDE 8.60 Fluid Pack lot # [id number]  . CATARACT EXTRACTION W/PHACO Right 12/28/2015   Procedure: CATARACT EXTRACTION PHACO AND INTRAOCULAR LENS PLACEMENT (IOC);  Surgeon: Leandrew Koyanagi, MD;  Location: [facility];  Service: Ophthalmology;  Laterality: Right;  requests early  . JOINT REPLACEMENT Right 2008   knee  . MASTECTOMY Right 2015   After screening in May 2015 - Pt choice    There were no vitals filed for this visit.  Subjective  Assessment - 04/03/17 1137    Subjective   I brought all my bandages from few years ago when Amy bandage me - and gloves - I am on hospice now - they come 1 x wk - I wear that forearm ittle sleeve you gave me for my forearm but it do not hold the swelling down - I have now swelling in my hand - and  I do wear my old daysleeve every day now - but my arm want to swell more - what can we do -Dr B said I have to until about June  - but only God konws - I am still driving my grandson from school , my R shoulder still hurts , cannot reach over head     Patient Stated Goals  I do not want my arm to get bigger , would to be able to keep the use of my arm so I can stay independent and active and pick up my grandson after school     Currently in Pain?  Yes    Pain Score  7     Pain Orientation  Right    Pain Descriptors / Indicators  Aching    Pain Onset  More than a month ago          LYMPHEDEMA/ONCOLOGY QUESTIONNAIRE - 04/03/17 1148      Right Upper Extremity Lymphedema   15 cm Proximal to Olecranon  Process  28 cm    10 cm Proximal to Olecranon Process  25.8 cm    Olecranon Process  25 cm    15 cm Proximal to Ulnar Styloid Process  24 cm    10 cm Proximal to Ulnar Styloid Process  19.5 cm    Just Proximal to Ulnar Styloid Process  14.5 cm    Across Hand at [employer]  20 cm    At [place] of 2nd Digit  6 cm    At [facility] of Thumb  6 cm      Left Upper Extremity Lymphedema   10 cm Proximal to Olecranon Process  23.1 cm    Olecranon Process  21.5 cm    15 cm Proximal to Ulnar Styloid Process  20.1 cm    10 cm Proximal to Ulnar Styloid Process  18.2 cm    Just Proximal to Ulnar Styloid Process  14.5 cm    Across Hand at [employer]  17.5 cm    At [place] of 2nd Digit  5.4 cm    At [facility] of Thumb  55.4 cm                      OT Education - 04/03/17 2015    Education provided  Yes    Education Details  findings of increase measurements and recommendation of lymphedema  garments     Person(s) Educated  Patient    Methods  Explanation;Demonstration;Tactile cues;Verbal cues    Comprehension  Verbalized understanding;Returned demonstration       OT Short Term Goals - 04/03/17 2022      OT SHORT TERM GOAL #1   Title  R UE circumference  decrease by 3 cm at least from wrist to upper arm to get measure for compression garments     Baseline  Upper arm and elbow 3.5 cm increase , forearm 3.9 and towards wrist 1.5 cm     Time  5    Period  Weeks    Status  On-going    Target Date  05/08/17      OT SHORT TERM GOAL #2   Title  Pt  L cervical rotation and lateral flexion improve to the same than R side pain less than 3/10     Baseline  3/10 at the best and 7/10 at the worse over upper trap - L cervical rotation and lateral flexion decrease - but she has metastasis in upper quadrant    Status  Deferred        OT Long Term Goals - 04/03/17 2024      OT LONG TERM GOAL #1   Title  Pt to be independent in homeporgram to maintain lymphedema in R UE - maintain or increase shoulder ROM without increasing pain     Baseline  see flowsheet for measurements  -persuing new compression garments     Time  8    Period  Weeks    Status  On-going    Target Date  05/29/17            Plan - 04/03/17 2017    Clinical Impression Statement  Pt was measured and compare to L UE - upper arm increase by 3.5 cm , elbow 3.5 cm , forearm 3.9 and towards wrist 1.5 cm - would not recommend bandaging at this time because it will be heavy on her arm and cause more traction on shoulder , scapula and brachial  plexus - pt to need to get measued for new daytime compression sleeve because the one she had is from 2016 when arm was smaller and it appear that top band and wrist casue turniquet - her compresion glove appear to [place] be okay - would recommend for her to get Reidsleeve comfort  to wear at night time to decrease lymphedema in L UE - pt to discuss with hospice nurse - and to  contact me if I need to also look into pink ribbon fund assistance     Occupational performance deficits (Please refer to evaluation for details):  ADL's;Leisure;Social Participation    Rehab Potential  Fair    OT Frequency  2x / week    OT Duration  8 weeks    OT Treatment/Interventions  Self-care/ADL training;DME and/or AE instruction;Therapeutic exercise;Passive range of motion;Patient/family education    Plan  Reassess when she gets her  compression garments - pt to contact me if need to contact Pink ribbon fund    Clinical Decision Making  Several treatment options, min-mod task modification necessary    OT Home Exercise Plan  see pt instruction     Consulted and Agree with Plan of Care  Patient       Patient will benefit from skilled therapeutic intervention in order to improve the following deficits and impairments:  Pain, Impaired flexibility, Impaired UE functional use, Decreased knowledge of precautions, Decreased range of motion  Visit Diagnosis: Postmastectomy lymphedema  Pain in right upper arm  Muscle spasm    Problem List Patient Active Problem List   Diagnosis Date Noted  . Carcinoma of lower-inner quadrant of left breast in female, estrogen receptor positive ([place]) 01/05/2016  . Mass of right side of neck 01/05/2016  . BP (high blood pressure) 12/21/2014    Lucianna Ostlund, Gwenette Greet 04/03/2017, 8:26 PM  [place] [facility] [address] [place], [address] Phone: [phone]   Fax:  [phone]  Name: CHINARA HERTZBERG MRN: [id number] Date of Birth: April 22, 1933

## 2017-04-03 NOTE — Telephone Encounter (Signed)
Cy does not have prescription appvd yesterday

## 2017-04-03 NOTE — Telephone Encounter (Signed)
x

## 2017-04-03 NOTE — Therapy (Signed)
[place] [facility] [address] [place], [address] Phone: [phone]   Fax:  [phone]  Occupational Therapy Treatment  Patient Details  Name: Hayley [name] MRN: [id number] Date of Birth: 1933/12/12 Referring Provider: Burlene Arnt   Encounter Date: 04/03/2017  OT End of Session - 04/03/17 2016    Visit Number  2    Number of Visits  4    Date for OT Re-Evaluation  05/31/17    OT Start Time  1130    OT Stop Time  1238    OT Time Calculation (min)  68 min    Activity Tolerance  Patient tolerated treatment well;Patient limited by pain;Treatment limited secondary to medical complications (Comment)    Behavior During Therapy  [facility] for tasks assessed/performed       Past Medical History:  Diagnosis Date  . Arthritis   . Breast cancer ([place]) 17+ years ago   Right Breast c radiation -- 2015 Mastectomy  . Cancer of breast ([place]) 01/20/2015  . HOH (hard of hearing)    AIDS  . Hypertension   . Malignant neoplasm of lower-inner quadrant of left breast in female, estrogen receptor negative [facility])     Past Surgical History:  Procedure Laterality Date  . ABDOMINAL HYSTERECTOMY    . BREAST LUMPECTOMY Right 1998   c Radiation  . CATARACT EXTRACTION W/PHACO Left 08/08/2015   Procedure: CATARACT EXTRACTION PHACO AND INTRAOCULAR LENS PLACEMENT (IOC);  Surgeon: Leandrew Koyanagi, MD;  Location: [facility];  Service: Ophthalmology;  Laterality: Left;  [place] 58.0 AP% 14.9 CDE 8.60 Fluid Pack lot # [id number]  . CATARACT EXTRACTION W/PHACO Right 12/28/2015   Procedure: CATARACT EXTRACTION PHACO AND INTRAOCULAR LENS PLACEMENT (IOC);  Surgeon: Leandrew Koyanagi, MD;  Location: [facility];  Service: Ophthalmology;  Laterality: Right;  requests early  . JOINT REPLACEMENT Right 2008   knee  . MASTECTOMY Right 2015   After screening in May 2015 - Pt choice    There were no vitals filed for this visit.  Subjective  Assessment - 04/03/17 1137    Subjective   I brought all my bandages from few years ago when Amy bandage me - and gloves - I am on hospice now - they come 1 x wk - I wear that forearm ittle sleeve you gave me for my forearm but it do not hold the swelling down - I have now swelling in my hand - and  I do wear my old daysleeve every day now - but my arm want to swell more - what can we do -Dr B said I have to until about June  - but only God konws - I am still driving my grandson from school , my R shoulder still hurts , cannot reach over head     Patient Stated Goals  I do not want my arm to get bigger , would to be able to keep the use of my arm so I can stay independent and active and pick up my grandson after school     Currently in Pain?  Yes    Pain Score  7     Pain Orientation  Right    Pain Descriptors / Indicators  Aching    Pain Onset  More than a month ago          LYMPHEDEMA/ONCOLOGY QUESTIONNAIRE - 04/03/17 1148      Right Upper Extremity Lymphedema   15 cm Proximal to Olecranon  Process  28 cm    10 cm Proximal to Olecranon Process  25.8 cm    Olecranon Process  25 cm    15 cm Proximal to Ulnar Styloid Process  24 cm    10 cm Proximal to Ulnar Styloid Process  19.5 cm    Just Proximal to Ulnar Styloid Process  14.5 cm    Across Hand at [employer]  20 cm    At [place] of 2nd Digit  6 cm    At [facility] of Thumb  6 cm      Left Upper Extremity Lymphedema   10 cm Proximal to Olecranon Process  23.1 cm    Olecranon Process  21.5 cm    15 cm Proximal to Ulnar Styloid Process  20.1 cm    10 cm Proximal to Ulnar Styloid Process  18.2 cm    Just Proximal to Ulnar Styloid Process  14.5 cm    Across Hand at [employer]  17.5 cm    At [place] of 2nd Digit  5.4 cm    At [facility] of Thumb  55.4 cm         Pt arrive with daytime compression sleeve - Custome Elvarex soft on right arm, no glove Report that her arm is feeling more swollen since seen 6 wks ago and it's moving into her  hand ,that daytime compression sleeve is not maintaining her arm. White soft comprex tubigrip provided a month ago for forearm use with the Isotoner gloves for night time, not maintaining her. Discuss several options with patient, patient had a lot of questions and concerns about options. One of them that is she cannot tolerate any bra straps on shoulder, so Jovi pack breast pad t cannot be recommended. Measurement was taken of right upper extremity -upper arm is increased 3.5 cm compared to the left ,elbow 3.5 cm increase, forearm increase 3.9 cm and towards wrist 1.5 cm. Patient had questions about re-bandaging her like in 2016, discussed with patient that it will be too heavy on her arm providing traction on her painful brachial plexus, scapula and shoulder. Would recommend at this time a Reid sleeve comfort night time garment with the power sleeve, had sample for pt to assess if she can get it on - did not had a power sleeves to use. Would recommend at this time a Reid sleeve comfort nighttime garment with power sleeve. With sample patient could put it on did not have a power sleeve to use.  Patients Jobs Elvarex soft custom is from 2016 it was measured when her arm was smaller. It appeared that cause at tourniquet at upper arm and at the wrist - causing swelling into the hand. And blocking the lymp flow through the upper arm. Patient report nurse from hospice is looking into if hospice can get her a new sleeve, discussed with patient another option will be the pink ribbon find that can help with the nighttime. Pt to discuss with hospice nurse, and to phone [employer] and contact me if I need to get Pink ribbon fund for assistance . Patient had a big concern about trigger fingers on bilateral third fingers. Locking up on her few times in clinic putting on her sleeves, SHe do have a device at home to help her put a daytime sleep on. This OT made her MC block ring splint for bilateral 3rd fingers to decrease  composite fist and locking during composite fisting and gripping act.  OT Education - 04/03/17 2015    Education provided  Yes    Education Details  findings of increase measurements and recommendation of lymphedema garments     Person(s) Educated  Patient    Methods  Explanation;Demonstration;Tactile cues;Verbal cues    Comprehension  Verbalized understanding;Returned demonstration       OT Short Term Goals - 04/03/17 2022      OT SHORT TERM GOAL #1   Title  R UE circumference  decrease by 3 cm at least from wrist to upper arm to get measure for compression garments     Baseline  Upper arm and elbow 3.5 cm increase , forearm 3.9 and towards wrist 1.5 cm     Time  5    Period  Weeks    Status  On-going    Target Date  05/08/17      OT SHORT TERM GOAL #2   Title  Pt  L cervical rotation and lateral flexion improve to the same than R side pain less than 3/10     Baseline  3/10 at the best and 7/10 at the worse over upper trap - L cervical rotation and lateral flexion decrease - but she has metastasis in upper quadrant    Status  Deferred        OT Long Term Goals - 04/03/17 2024      OT LONG TERM GOAL #1   Title  Pt to be independent in homeporgram to maintain lymphedema in R UE - maintain or increase shoulder ROM without increasing pain     Baseline  see flowsheet for measurements  -persuing new compression garments     Time  8    Period  Weeks    Status  On-going    Target Date  05/29/17            Plan - 04/03/17 2017    Clinical Impression Statement  Pt was measured and compare to L UE - upper arm increase by 3.5 cm , elbow 3.5 cm , forearm 3.9 and towards wrist 1.5 cm - would not recommend bandaging at this time because it will be heavy on her arm and cause more traction on shoulder , scapula and brachial plexus - pt to need to get measued for new daytime compression sleeve because the one she had is from 2016 when arm was smaller and  it appear that top band and wrist casue turniquet - her compresion glove appear to [place] be okay - would recommend for her to get Reidsleeve comfort  to wear at night time to decrease lymphedema in L UE - pt to discuss with hospice nurse - and to contact me if I need to also look into pink ribbon fund assistance     Occupational performance deficits (Please refer to evaluation for details):  ADL's;Leisure;Social Participation    Rehab Potential  Fair    OT Frequency  2x / week    OT Duration  8 weeks    OT Treatment/Interventions  Self-care/ADL training;DME and/or AE instruction;Therapeutic exercise;Passive range of motion;Patient/family education    Plan  Reassess when she gets her  compression garments - pt to contact me if need to contact Pink ribbon fund    Clinical Decision Making  Several treatment options, min-mod task modification necessary    OT Home Exercise Plan  see pt instruction     Consulted and Agree with Plan of Care  Patient       Patient will  benefit from skilled therapeutic intervention in order to improve the following deficits and impairments:  Pain, Impaired flexibility, Impaired UE functional use, Decreased knowledge of precautions, Decreased range of motion  Visit Diagnosis: Postmastectomy lymphedema  Pain in right upper arm  Muscle spasm    Problem List Patient Active Problem List   Diagnosis Date Noted  . Carcinoma of lower-inner quadrant of left breast in female, estrogen receptor positive ([place]) 01/05/2016  . Mass of right side of neck 01/05/2016  . BP (high blood pressure) 12/21/2014    Rosalyn Gess OTR/L,CLT 04/03/2017, 8:43 PM  [place] [facility] [address] [place], [address] Phone: [phone]   Fax:  [phone]  Name: Hayley [name] MRN: [id number] Date of Birth: 1933-07-28

## 2017-04-11 ENCOUNTER — Telehealth: Payer: Self-pay | Admitting: *Deleted

## 2017-04-11 MED ORDER — MORPHINE SULFATE ER 15 MG PO TBCR: 15.0000 mg | EXTENDED_RELEASE_TABLET | Freq: Two times a day (BID) | ORAL | 0 refills | Status: DC

## 2017-04-11 NOTE — Telephone Encounter (Signed)
Per Dr Rogue Bussing MS ER 15 mg twice a day. Manuela Schwartz notified and prescription faxed to pharmacy. Manuela Schwartz informed

## 2017-04-11 NOTE — Telephone Encounter (Signed)
Manuela Schwartz with Hospice called to report that she is no longer getting pain relief from [place] in her neck. Patient refuses to wear Fentanyl patch so asking If Norco can be increased or add Tramadol. Patient is only taking Norco three times a day , not every 6 hours as needed. Can we add MS ER since not using Fentanyl? Please advise

## 2017-04-25 ENCOUNTER — Other Ambulatory Visit: Payer: Self-pay | Admitting: *Deleted

## 2017-04-25 MED ORDER — MORPHINE SULFATE ER 15 MG PO TBCR: 15.0000 mg | EXTENDED_RELEASE_TABLET | Freq: Two times a day (BID) | ORAL | 0 refills | Status: DC

## 2017-05-10 ENCOUNTER — Other Ambulatory Visit: Payer: Self-pay | Admitting: *Deleted

## 2017-05-10 MED ORDER — MORPHINE SULFATE ER 15 MG PO TBCR: 15.0000 mg | EXTENDED_RELEASE_TABLET | Freq: Three times a day (TID) | ORAL | 0 refills | Status: DC

## 2017-05-10 MED ORDER — HYDROCODONE-ACETAMINOPHEN 5-325 MG PO TABS: 1.0000 | ORAL_TABLET | Freq: Four times a day (QID) | ORAL | 0 refills | Status: DC | PRN

## 2017-05-10 NOTE — Telephone Encounter (Signed)
Unfortunately I do not know this patient and do not feel comfortable making that call. If Dr. Jacinto Reap says yes, then I can send the prescription electronically though. Thanks

## 2017-05-10 NOTE — Telephone Encounter (Signed)
Per VO Dr Rogue Bussing, may increase MS Contin to three times a day Manuela Schwartz informed on order increase

## 2017-05-10 NOTE — Telephone Encounter (Signed)
Asking if MS Contin can be increase because she doe snot notice much relief from the MS 15 bid

## 2017-05-16 ENCOUNTER — Telehealth: Payer: Self-pay | Admitting: *Deleted

## 2017-05-16 MED ORDER — AZITHROMYCIN 250 MG PO TABS: ORAL_TABLET | ORAL | 0 refills | Status: DC

## 2017-05-16 NOTE — Telephone Encounter (Signed)
Per Lorretta Harp, NP, Zpak to be sent to pharmacy. I have a call in to hospice to confirm pharmacy as we have a different one listed  Hospice confirmed [facility] is correct pharmacy

## 2017-05-16 NOTE — Telephone Encounter (Signed)
Hospice Nurse called to report that patient has a bad sinus infection and is having pressure and headache, blowing out dark green mucous. Asking for antibiotics to be sent to [facility]. Please advise

## 2017-05-24 ENCOUNTER — Other Ambulatory Visit: Payer: Self-pay | Admitting: *Deleted

## 2017-05-24 MED ORDER — MORPHINE SULFATE ER 15 MG PO TBCR: 15.0000 mg | EXTENDED_RELEASE_TABLET | Freq: Three times a day (TID) | ORAL | 0 refills | Status: DC

## 2017-05-27 ENCOUNTER — Telehealth: Payer: Self-pay | Admitting: *Deleted

## 2017-05-27 MED ORDER — HYDROCODONE-ACETAMINOPHEN 5-325 MG PO TABS: 1.0000 | ORAL_TABLET | Freq: Four times a day (QID) | ORAL | 0 refills | Status: DC | PRN

## 2017-05-27 NOTE — Telephone Encounter (Signed)
Patient continues to have unrelieved pain with increase in MS Contin 15 to three times a day on 2/7. She is using her Norco 4 per day as ordered, but is unable to sleep due to the pain. Please advise

## 2017-05-27 NOTE — Telephone Encounter (Signed)
I think she should be evaluated in symptom management. We may need to up her dose but if it is that uncontrolled I want to make sure there is not something else going on. Thanks.

## 2017-05-27 NOTE — Telephone Encounter (Signed)
This is hospice patient and this is not a new problem, Per Hayley [name], she will check with Dr B for advice

## 2017-05-27 NOTE — Telephone Encounter (Signed)
Hayley [name] informed per VO Dr B to increase MS Contin to 30 mg twice a day and if no improvement can increase to 30 mg THREE TIMES A DAY she repeated back to me and asked for refill of her Norco be sent to pharmacy

## 2017-05-29 ENCOUNTER — Telehealth: Payer: Self-pay | Admitting: *Deleted

## 2017-05-29 NOTE — Telephone Encounter (Signed)
Hospice nurse called and asking if the Norco can be taken every 4 hours as needed as there patient is still in pain. MS ER was just increased yesterday. Please advise

## 2017-05-29 NOTE — Telephone Encounter (Signed)
Hayley [name] informed of dosing change

## 2017-05-29 NOTE — Telephone Encounter (Signed)
md agrees with plan of care for Norco 1 tab every 4 hours for pain

## 2017-05-31 ENCOUNTER — Inpatient Hospital Stay: Payer: Medicare Other | Attending: Nurse Practitioner | Admitting: Nurse Practitioner

## 2017-05-31 ENCOUNTER — Encounter: Payer: Self-pay | Admitting: Nurse Practitioner

## 2017-05-31 ENCOUNTER — Telehealth: Payer: Self-pay | Admitting: *Deleted

## 2017-05-31 VITALS — BP 137/64 | HR 79 | Temp 97.4°F | Resp 18 | Ht 65.0 in | Wt 112.5 lb

## 2017-05-31 DIAGNOSIS — Z923 Personal history of irradiation: Secondary | ICD-10-CM

## 2017-05-31 DIAGNOSIS — R29810 Facial weakness: Secondary | ICD-10-CM | POA: Insufficient documentation

## 2017-05-31 DIAGNOSIS — Z9889 Other specified postprocedural states: Secondary | ICD-10-CM | POA: Insufficient documentation

## 2017-05-31 DIAGNOSIS — Z9011 Acquired absence of right breast and nipple: Secondary | ICD-10-CM | POA: Insufficient documentation

## 2017-05-31 DIAGNOSIS — Z79899 Other long term (current) drug therapy: Secondary | ICD-10-CM | POA: Insufficient documentation

## 2017-05-31 DIAGNOSIS — Z170 Estrogen receptor positive status [ER+]: Secondary | ICD-10-CM

## 2017-05-31 DIAGNOSIS — Z7189 Other specified counseling: Secondary | ICD-10-CM

## 2017-05-31 DIAGNOSIS — G893 Neoplasm related pain (acute) (chronic): Secondary | ICD-10-CM | POA: Insufficient documentation

## 2017-05-31 DIAGNOSIS — Z90710 Acquired absence of both cervix and uterus: Secondary | ICD-10-CM | POA: Diagnosis not present

## 2017-05-31 DIAGNOSIS — C50312 Malignant neoplasm of lower-inner quadrant of left female breast: Secondary | ICD-10-CM | POA: Diagnosis not present

## 2017-05-31 MED ORDER — LIDOCAINE 5 % EX PTCH: 1.0000 | MEDICATED_PATCH | CUTANEOUS | 2 refills | Status: AC

## 2017-05-31 MED ORDER — DEXAMETHASONE 2 MG PO TABS: 2.0000 mg | ORAL_TABLET | Freq: Two times a day (BID) | ORAL | 0 refills | Status: AC

## 2017-05-31 MED ORDER — MORPHINE SULFATE ER 30 MG PO TBCR: 30.0000 mg | EXTENDED_RELEASE_TABLET | Freq: Three times a day (TID) | ORAL | 0 refills | Status: DC

## 2017-05-31 NOTE — Progress Notes (Signed)
Pt started having HA about sat. It is mostly at night and it hurts on right front.  Then she noticed her eye drooping on right side mon or tues of this week and then mouth drooping yest. Right arm pain with neck and shoulder constant and just took hydrocodone at 1 pm today and takes around the clock and feels like she does not have good pain relief

## 2017-05-31 NOTE — Progress Notes (Signed)
Symptom Management Consult note [facility]  [phone] [phone]  Patient Care Team: Lenard Simmer, MD as PCP - General (Endocrinology)   Name of the patient: Hayley [name]  [id number]  24-Jul-1933   Date of visit: 05/31/17  Diagnosis-metastatic right breast cancer  Chief complaint/ Reason for visit- facial droop & pain  Heme/Onc history: Patient last evaluated by primary oncologist. Dr. Rogue Bussing on 02/06/17.  Patient currently on hospice care. She has a history of metastatic ER/PR positive, HER-2/neu negative breast cancer.  Most recently on Faslodex/Ibrance-May 22, 2016.  Stopped due to intolerance in July 2018.  December 4 CT showed progression of the right brachial plexus lesion/neck lesion and increasing lung nodules.  At that time, patient declined any further treatments with understanding that all treatments would be given with palliative, not curative intense.  She expressed significant concern regarding side effects and she declined to consider any further treatments.  At that time the discussion was that her life expectancy was likely in the order of a few months given the rapid progression noted on CT scans.  Patient initially seen by Dr. Oliva Bustard consultation for right breast cancer.  Previously she has had mastectomy at [facility].  November 2016-stage IV recurrent invasive mammary carcinoma ER 90%, PR 51-90%, HER-2/neu negative based on right axillary lymph node biopsy.  November 2016 Ibrance and letrozole.  April 2017 PET right lower lobe/left lower lobe progression.  Supraclavicular/axillary lymph nodes stable.  ~Marker rising.  August 23, 2015 Faslodex plus Ibrance 100 mg.  October 2-CT chest abdomen pelvis stable pulmonary mets November CT indicated progression in the brachial plexus area-radiation therapy December 2017. 05/22/2016 PET showed improving metastasis along right neck, right supraclavicular area and right axilla/brachail  plexus. Improving prevascular nodal mets. Bilateral pulmonary metastases stable vs mildly improved. 10/08/16- CT Neck/C/A/P- progression of metastatic disease with increased number and size of numerous pulmonary metastases, as well as increasingly prominent soft tissue extending from the right brachial plexus into the right axillary region, as detailed above. No new metastatic disease in the abdomen or pelvis. Discussed palliative treatment options to manage pain and help her live longer but she was concerned of side effects and refused additional radiation. 10/26/16- discussed option of aromasin-afinitor or taxol/xeloda and other chemotherapies. Patient concerned of side effects and reluctant to consider treatments given that they are palliative in intent vs curative. 11/26/16- declined any further treatment. 02/04/17- CT Neck/Chest revealed progression of bilateral pulmonary metastases, measuring up to 3.0 cm in the bilateral lower lobes. 2.4 cm spiculated enhancing lesion in the right supraclavicular region corresponding to patient's known right neck mass more conspicuous than on prior. 3m short axis left axillary node, new. S/p right mastectomy. December 2018. Initiated hospice services and declined further treatments.   Interval history-patient presents to symptom management clinic today with complaints of new right facial droop and pain.  She states that she has had right-sided temporal headaches that have increased in severity over the past week.  Headaches are worse when laying down and improved with sitting upright.  She noticed last night that she had new right-sided facial droop.  She maintains her strength in her extremities and is ambulatory to clinic today.  She states she is most concerned about her pain and requests  to avoid hospitalization, ER, imaging.  She continues to be under hospice care.  Today, she requests we palliate her current symptoms she is most distressed by pain.  Reports poor sleep  frequently interrupted  by pain.  She has chronic lymphedema of the right upper extremity continues to wear compression garments which mildly improve her symptoms.  She has been previously seen by physical therapy for this and declines further interventions.  ECOG FS:1 - Symptomatic but completely ambulatory  Review of systems- Review of Systems  Constitutional: Negative for chills, diaphoresis, fever, malaise/fatigue and weight loss.  HENT: Negative for congestion, ear pain, hearing loss, nosebleeds, sinus pain, sore throat and tinnitus.   Eyes: Positive for redness. Negative for blurred vision, double vision, photophobia, pain and discharge.       Right eye droop.   Respiratory: Negative.  Negative for stridor.   Cardiovascular: Negative.   Gastrointestinal: Negative.   Genitourinary: Negative.   Musculoskeletal: Positive for neck pain.       Right temporal pain, right neck pain, right arm pain. Lymphedema of RUE- wearing compression sleeve  Skin: Negative for itching and rash.  Neurological: Positive for headaches.  Endo/Heme/Allergies: Negative.   Psychiatric/Behavioral: Negative.     Current treatment- Hospice  Allergies  Allergen Reactions  . Tetracyclines & Related    Past Medical History:  Diagnosis Date  . Arthritis   . Breast cancer ([place]) 17+ years ago   Right Breast c radiation -- 2015 Mastectomy  . Cancer of breast ([place]) 01/20/2015  . HOH (hard of hearing)    AIDS  . Hypertension   . Malignant neoplasm of lower-inner quadrant of left breast in female, estrogen receptor negative [facility])    Past Surgical History:  Procedure Laterality Date  . ABDOMINAL HYSTERECTOMY    . BREAST LUMPECTOMY Right 1998   c Radiation  . CATARACT EXTRACTION W/PHACO Left 08/08/2015   Procedure: CATARACT EXTRACTION PHACO AND INTRAOCULAR LENS PLACEMENT (IOC);  Surgeon: Leandrew Koyanagi, MD;  Location: [facility] ORS;  Service: Ophthalmology;  Laterality: Left;  [place] 58.0 AP% 14.9 CDE 8.60 Fluid  Pack lot # [id number]  . CATARACT EXTRACTION W/PHACO Right 12/28/2015   Procedure: CATARACT EXTRACTION PHACO AND INTRAOCULAR LENS PLACEMENT (IOC);  Surgeon: Leandrew Koyanagi, MD;  Location: [place];  Service: Ophthalmology;  Laterality: Right;  requests early  . JOINT REPLACEMENT Right 2008   knee  . MASTECTOMY Right 2015   After screening in May 2015 - Pt choice   Social History   Socioeconomic History  . Marital status: Widowed    Spouse name: Not on file  . Number of children: Not on file  . Years of education: Not on file  . Highest education level: Not on file  Occupational History  . Not on file  Social Needs  . Financial resource strain: Not on file  . Food insecurity:    Worry: Not on file    Inability: Not on file  . Transportation needs:    Medical: Not on file    Non-medical: Not on file  Tobacco Use  . Smoking status: Never Smoker  . Smokeless tobacco: Never Used  Substance and Sexual Activity  . Alcohol use: No    Alcohol/week: 0.0 oz  . Drug use: No  . Sexual activity: Not on file  Lifestyle  . Physical activity:    Days per week: Not on file    Minutes per session: Not on file  . Stress: Not on file  Relationships  . Social connections:    Talks on phone: Not on file    Gets together: Not on file    Attends religious service: Not on file    Active member of  club or organization: Not on file    Attends meetings of clubs or organizations: Not on file    Relationship status: Not on file  . Intimate partner violence:    Fear of current or ex partner: Not on file    Emotionally abused: Not on file    Physically abused: Not on file    Forced sexual activity: Not on file  Other Topics Concern  . Not on file  Social History Narrative  . Not on file    History reviewed. No pertinent family history.   Current Outpatient Medications:  .  HYDROcodone-acetaminophen (NORCO/VICODIN) 5-325 MG tablet, Take 1 tablet by mouth every 6 (six) hours  as needed for moderate pain. (Patient taking differently: Take 1 tablet by mouth every 4 (four) hours as needed for moderate pain. ), Disp: 45 tablet, Rfl: 0 .  lidocaine (LIDODERM) 5 %, Place 1 patch onto the skin daily. Remove & Discard patch within 12 hours or as directed by MD, Disp: 35 patch, Rfl: 0 .  lisinopril (PRINIVIL,ZESTRIL) 5 MG tablet, Take 5 mg by mouth daily. , Disp: , Rfl:  .  Melatonin 3 MG TABS, Take 3 mg by mouth as needed., Disp: , Rfl:  .  morphine (MS CONTIN) 15 MG 12 hr tablet, Take 1 tablet (15 mg total) by mouth every 8 (eight) hours. (Patient taking differently: Take 30 mg by mouth every 12 (twelve) hours. May increase to THREE TIMES A DAY if pain not relieved), Disp: 45 tablet, Rfl: 0 .  ALPHA LIPOIC ACID PO, Take 1 capsule by mouth daily. , Disp: , Rfl:  .  azithromycin (ZITHROMAX) 250 MG tablet, Take 2 tabs on day one and 1 tablet daily days 2 - 5, Disp: 6 each, Rfl: 0 .  calcium carbonate (TUMS - DOSED IN MG ELEMENTAL CALCIUM) 500 MG chewable tablet, Chew 1 tablet by mouth daily. , Disp: , Rfl:  .  Cholecalciferol (VITAMIN D3 PO), Take 4,000 Units by mouth 2 (two) times daily., Disp: , Rfl:  .  Coenzyme Q10 (CO Q 10 PO), Take by mouth daily., Disp: , Rfl:  .  Glucosamine-Chondroitin (GLUCOSAMINE CHONDR COMPLEX PO), Take 1 tablet by mouth 2 (two) times daily. , Disp: , Rfl:  .  RESVERATROL PO, Take 1 tablet by mouth daily. , Disp: , Rfl:   Physical exam:  Vitals:   05/31/17 1424  BP: 137/64  Pulse: 79  Resp: 18  Temp: (!) 97.4 F (36.3 C)  TempSrc: Tympanic  Weight: 112 lb 8 oz (51 kg)  Height: '5\' 5"'  (1.651 m)   Physical Exam  Constitutional: She is oriented to person, place, and time.  Chronically ill, frail appearing elderly female. Accompanied by daughter.   HENT:  Mouth/Throat: Oropharynx is clear and moist.  Uneven smile.  Eyes: Right eye visual fields normal and left eye visual fields normal. Pupils are equal, round, and reactive to light.  Conjunctivae are normal. Pupils are equal.  Right eye and facial droop  Neck:  Right neck mass  Cardiovascular: Normal rate, regular rhythm and normal heart sounds.  Pulmonary/Chest: Effort normal and breath sounds normal.  Abdominal: Soft. She exhibits no distension. There is no tenderness.  Musculoskeletal: She exhibits edema (RUE lymphedema in compression garment) and tenderness (dec ROM RUE at baseline per patient).  Ambulatory w/o aids. Strength intact.   Lymphadenopathy:    She has cervical adenopathy.  Neurological: She is alert and oriented to person, place, and time. Gait normal.  Skin:  Skin is warm and dry. There is pallor.  Psychiatric: Mood, memory, affect and judgment normal.     CMP Latest Ref Rng & Units 02/06/2017  Glucose 65 - 99 mg/dL 95  BUN 6 - 20 mg/dL 15  Creatinine 0.44 - 1.00 mg/dL 0.65  Sodium 135 - 145 mmol/L 132(L)  Potassium 3.5 - 5.1 mmol/L 4.3  Chloride 101 - 111 mmol/L 98(L)  CO2 22 - 32 mmol/L 26  Calcium 8.9 - 10.3 mg/dL 9.0  Total Protein 6.5 - 8.1 g/dL 6.6  Total Bilirubin 0.3 - 1.2 mg/dL 0.7  Alkaline Phos 38 - 126 U/L 49  AST 15 - 41 U/L 25  ALT 14 - 54 U/L 22   CBC Latest Ref Rng & Units 02/06/2017  WBC 3.6 - 11.0 K/uL 6.9  Hemoglobin 12.0 - 16.0 g/dL 13.5  Hematocrit 35.0 - 47.0 % 39.6  Platelets 150 - 440 K/uL 214   No images are attached to the encounter.  No results found.  Assessment and plan- Patient is a 84 y.o. female with metastatic breast cancer, currently on [place], who presents to Symptom Management for pain and facial droop.   1. Metastatic ER/PR positive, HER-2/neu negative right breast cancer- 02/05/17 CT showed progression of the right brachial plexus lesion/neck lesion and increase in size in lung nodules, s/p radiation and faslodex/ibrance. Previously, patient declined further treatments given that treatments and elected for Hospice Care in December 2018 (see discussion below)  2. Right facial droop- unclear  etiology- progression? clot? - discussed that to appropriately manage we would need imaging studies which she declines. We discussed that findings would determine management and that without working up her symptoms we are limited. She verbalizes understanding and requests that we provide symptom support. Appears right eye does not close completely and lower lid droop may be causing increased tearing/dry eye. Discussed lubricant eye ointment or drops as needed, particularly over night/during sleep. Fully ambulatory, extremity strength at baseline; suspect symptoms r/t progression. Per Dr. Rogue Bussing, will do short course of steroids to assess response.   3. Neoplasm related pain- continues to have worsening pain of right shoulder and neck which interferes significantly with sleep. Tolerating narcotic medications without significant side effects. No longer driving. With new symptoms, suspect progression causing pain and will likely need frequent dose adjustments in the future.  Additional pain d/t lymphedema of right arm. Will adjust pain medication to MS Contin 28m TID. Add Lidoderm patches for localized neck pain.   4. Goals of Care- Per Dr. BAletha Halimnote, previously discussed that further treatments would be given with palliative, not curative, intents. At that time, she declined to proceed with further treatments and elected for Hospice Care. We again revisited goals of care and she verbalizes her desires to maintain quality of life and that she wishes to avoid any further work-up for symptoms and her concern today is pain management as it significantly impairs her QOL and sleep. Patient currently waking up every 2-3 hours d/t pain. Daughter accompanies patient today and agrees and supports patient's decision.   Discussed case with Dr. BRogue Bussingwho agrees with plan of care. Follow-up in clinic as per patient request.    Visit Diagnosis 1. Carcinoma of lower-inner quadrant of left breast in [place],  [place]   2. Neoplasm related pain   3. Facial droop   4. Goals of care, counseling/discussion     Patient expressed understanding and was in agreement with this plan. She also understands that  She can call clinic at any time with any questions, concerns, or complaints.   A total of (45) minutes of face-to-face time was spent with this patient with greater than 50% of that time in counseling and care-coordination.  Beckey Rutter, DNP, AGNP-C [facility] [phone] (office) 05/31/17 3:24 PM  06/04/17- ADDENDUM: patient's pain uncontrolled on MS Contin 54m TID. Will adjust bedtime dose to MS Contin 45 mg (359mtablet and 1520mablet).

## 2017-05-31 NOTE — Telephone Encounter (Signed)
Hospice nurse reports that yesterday she visited the patient for routine check, she observed that the patient had right facial drooping, the patient was alert and oriented, speech was clear, had equal strong grip bilaterally. Patient refused ER.  Patient will be seen in symptom management this afternoon.

## 2017-06-07 ENCOUNTER — Other Ambulatory Visit: Payer: Self-pay | Admitting: *Deleted

## 2017-06-07 MED ORDER — HYDROCODONE-ACETAMINOPHEN 5-325 MG PO TABS: 1.0000 | ORAL_TABLET | ORAL | 0 refills | Status: DC | PRN

## 2017-06-10 ENCOUNTER — Telehealth: Payer: Self-pay | Admitting: *Deleted

## 2017-06-10 NOTE — Telephone Encounter (Signed)
Patient still rating her pain at 6/10 Upper r chest radiating through to back. Patient medicine increased last week, asking if dose can be adjusted again.   Per Alease Medina, NP dose can be increased to 45 mg every AM and every HS and continue 30 mg dose at midday. Lorriane Shire repeated back to me and states patient has enough medicine for now

## 2017-06-14 ENCOUNTER — Other Ambulatory Visit: Payer: Self-pay | Admitting: *Deleted

## 2017-06-14 MED ORDER — MORPHINE SULFATE ER 30 MG PO TBCR: 30.0000 mg | EXTENDED_RELEASE_TABLET | Freq: Three times a day (TID) | ORAL | 0 refills | Status: DC

## 2017-06-14 MED ORDER — MORPHINE SULFATE ER 15 MG PO TBCR: EXTENDED_RELEASE_TABLET | ORAL | 0 refills | Status: DC

## 2017-06-19 ENCOUNTER — Telehealth: Payer: Self-pay | Admitting: *Deleted

## 2017-06-19 NOTE — Telephone Encounter (Signed)
Left message on [place] voice mail with doctor approval for her suggestion for MS ER increase

## 2017-06-19 NOTE — Telephone Encounter (Signed)
md agrees with hospice recommendations.

## 2017-06-19 NOTE — Telephone Encounter (Signed)
Patient still complaining of pain 7-8/10 on the MS ER 45 mg AM/ PM and 30 mg at midday. Using Norco every 4 hours around the clock. Requesting to increase midday dose to 45 mg and PM dose to 60 mg as she is awaking in night in pain and taking Norco in middle of night. Please advise

## 2017-06-24 ENCOUNTER — Other Ambulatory Visit: Payer: Self-pay | Admitting: *Deleted

## 2017-06-24 MED ORDER — MORPHINE SULFATE ER 60 MG PO TBCR: 60.0000 mg | EXTENDED_RELEASE_TABLET | Freq: Every day | ORAL | 0 refills | Status: AC

## 2017-06-27 ENCOUNTER — Telehealth: Payer: Self-pay | Admitting: *Deleted

## 2017-06-27 ENCOUNTER — Other Ambulatory Visit: Payer: Self-pay | Admitting: Nurse Practitioner

## 2017-06-27 MED ORDER — DULOXETINE HCL 30 MG PO CPEP: 30.0000 mg | ORAL_CAPSULE | Freq: Every day | ORAL | 0 refills | Status: AC

## 2017-06-27 NOTE — Telephone Encounter (Signed)
Lauren was going to call Lorriane Shire with orders

## 2017-06-27 NOTE — Telephone Encounter (Signed)
Sonia Baller or Lauren, Please advise as Dr. B has left for today.

## 2017-06-27 NOTE — Telephone Encounter (Signed)
Hospice nurse called and reports that patient is still rating her pain at 7-8/10 and that she is currently taking MS ER 45 mg morning and noon and 60 mg at hs, she continues to use her Norco about every 6 hours but finds it does not help that much. Hayley [name] is requesting that something for nerve pain be ordered for her. She cannot take Gabapentin, it made her eyes swell. Please advise

## 2017-07-01 ENCOUNTER — Other Ambulatory Visit: Payer: Self-pay | Admitting: *Deleted

## 2017-07-01 MED ORDER — MORPHINE SULFATE ER 15 MG PO TBCR
EXTENDED_RELEASE_TABLET | ORAL | 0 refills | Status: AC
Start: 2017-07-01 — End: ?

## 2017-07-01 MED ORDER — MORPHINE SULFATE ER 30 MG PO TBCR: EXTENDED_RELEASE_TABLET | ORAL | 0 refills | Status: AC

## 2017-07-01 MED ORDER — HYDROCODONE-ACETAMINOPHEN 5-325 MG PO TABS: 1.0000 | ORAL_TABLET | ORAL | 0 refills | Status: AC | PRN

## 2017-07-03 ENCOUNTER — Telehealth: Payer: Self-pay | Admitting: *Deleted

## 2017-07-03 NOTE — Telephone Encounter (Signed)
Hospice nurse Lorriane Shire called to report that patient is being moved to the hospice home. She does not want to die at home and she has decided she will take more pain medicine. She is being admitted for  Pain control, shortness of breath.

## 2017-07-08 ENCOUNTER — Telehealth: Payer: Self-pay | Admitting: *Deleted

## 2017-07-08 NOTE — Telephone Encounter (Signed)
Hospice Home called to report that patient expired 07/19/2017@ 8:31PM

## 2017-08-03 DEATH — deceased
